# Patient Record
Sex: Male | Born: 1940 | Race: White | Hispanic: No | Marital: Married | Smoking: Former smoker
Health system: Southern US, Community
[De-identification: ages and names within clinical notes are randomized; demographics above are authoritative.]

## PROBLEM LIST (undated history)

## (undated) DIAGNOSIS — R339 Retention of urine, unspecified: Secondary | ICD-10-CM

## (undated) DIAGNOSIS — J439 Emphysema, unspecified: Secondary | ICD-10-CM

## (undated) DIAGNOSIS — C3490 Malignant neoplasm of unspecified part of unspecified bronchus or lung: Secondary | ICD-10-CM

## (undated) DIAGNOSIS — R1900 Intra-abdominal and pelvic swelling, mass and lump, unspecified site: Secondary | ICD-10-CM

## (undated) DIAGNOSIS — N312 Flaccid neuropathic bladder, not elsewhere classified: Secondary | ICD-10-CM

## (undated) DIAGNOSIS — Z973 Presence of spectacles and contact lenses: Secondary | ICD-10-CM

## (undated) DIAGNOSIS — K219 Gastro-esophageal reflux disease without esophagitis: Secondary | ICD-10-CM

## (undated) DIAGNOSIS — Z85048 Personal history of other malignant neoplasm of rectum, rectosigmoid junction, and anus: Secondary | ICD-10-CM

## (undated) DIAGNOSIS — Z933 Colostomy status: Secondary | ICD-10-CM

## (undated) DIAGNOSIS — Z972 Presence of dental prosthetic device (complete) (partial): Secondary | ICD-10-CM

## (undated) DIAGNOSIS — Z923 Personal history of irradiation: Secondary | ICD-10-CM

## (undated) DIAGNOSIS — K6289 Other specified diseases of anus and rectum: Secondary | ICD-10-CM

---

## 1970-12-07 HISTORY — PX: NASAL SEPTUM SURGERY: SHX37

## 1995-11-10 HISTORY — PX: OTHER SURGICAL HISTORY: SHX169

## 1996-07-07 HISTORY — PX: OTHER SURGICAL HISTORY: SHX169

## 1998-08-27 ENCOUNTER — Ambulatory Visit (HOSPITAL_COMMUNITY): Admission: RE | Admit: 1998-08-27 | Discharge: 1998-08-27 | Payer: Self-pay | Admitting: General Surgery

## 1998-09-05 ENCOUNTER — Ambulatory Visit (HOSPITAL_COMMUNITY): Admission: RE | Admit: 1998-09-05 | Discharge: 1998-09-05 | Payer: Self-pay | Admitting: General Surgery

## 1998-12-07 HISTORY — PX: OTHER SURGICAL HISTORY: SHX169

## 1998-12-20 ENCOUNTER — Encounter: Payer: Self-pay | Admitting: Emergency Medicine

## 1998-12-21 ENCOUNTER — Encounter: Payer: Self-pay | Admitting: General Surgery

## 1998-12-21 ENCOUNTER — Inpatient Hospital Stay (HOSPITAL_COMMUNITY): Admission: EM | Admit: 1998-12-21 | Discharge: 1999-01-06 | Payer: Self-pay | Admitting: Emergency Medicine

## 1998-12-22 ENCOUNTER — Encounter: Payer: Self-pay | Admitting: General Surgery

## 1998-12-24 ENCOUNTER — Encounter: Payer: Self-pay | Admitting: Pulmonary Disease

## 1998-12-25 ENCOUNTER — Encounter: Payer: Self-pay | Admitting: Pulmonary Disease

## 1998-12-26 ENCOUNTER — Encounter: Payer: Self-pay | Admitting: Pulmonary Disease

## 1998-12-27 ENCOUNTER — Encounter: Payer: Self-pay | Admitting: Pulmonary Disease

## 1998-12-31 ENCOUNTER — Encounter: Payer: Self-pay | Admitting: Pulmonary Disease

## 1999-01-02 ENCOUNTER — Encounter: Payer: Self-pay | Admitting: General Surgery

## 2001-01-05 ENCOUNTER — Emergency Department (HOSPITAL_COMMUNITY): Admission: EM | Admit: 2001-01-05 | Discharge: 2001-01-05 | Payer: Self-pay | Admitting: Emergency Medicine

## 2001-01-05 ENCOUNTER — Encounter: Payer: Self-pay | Admitting: Emergency Medicine

## 2002-06-29 ENCOUNTER — Ambulatory Visit (HOSPITAL_COMMUNITY): Admission: RE | Admit: 2002-06-29 | Discharge: 2002-06-29 | Payer: Self-pay | Admitting: Gastroenterology

## 2004-10-17 ENCOUNTER — Ambulatory Visit: Payer: Self-pay | Admitting: Gastroenterology

## 2004-11-12 ENCOUNTER — Ambulatory Visit: Payer: Self-pay | Admitting: Gastroenterology

## 2005-05-08 ENCOUNTER — Ambulatory Visit: Payer: Self-pay | Admitting: Hematology & Oncology

## 2006-04-07 ENCOUNTER — Encounter: Payer: Self-pay | Admitting: General Surgery

## 2011-03-03 ENCOUNTER — Other Ambulatory Visit: Payer: Self-pay | Admitting: Family Medicine

## 2011-03-03 DIAGNOSIS — R9389 Abnormal findings on diagnostic imaging of other specified body structures: Secondary | ICD-10-CM

## 2011-03-05 ENCOUNTER — Ambulatory Visit
Admission: RE | Admit: 2011-03-05 | Discharge: 2011-03-05 | Disposition: A | Discharge status: Home / Self Care | Payer: MEDICARE | Source: Ambulatory Visit | Attending: Family Medicine | Admitting: Family Medicine

## 2011-03-05 DIAGNOSIS — R9389 Abnormal findings on diagnostic imaging of other specified body structures: Secondary | ICD-10-CM

## 2011-03-05 MED ORDER — IOHEXOL 300 MG/ML  SOLN
75.0000 mL | Freq: Once | INTRAMUSCULAR | Status: AC | PRN
  Administered 2011-03-05: 75 mL via INTRAVENOUS

## 2013-11-28 ENCOUNTER — Institutional Professional Consult (permissible substitution): Payer: Medicare Other | Admitting: Internal Medicine

## 2013-12-08 ENCOUNTER — Encounter: Payer: Self-pay | Admitting: Internal Medicine

## 2013-12-08 ENCOUNTER — Ambulatory Visit (INDEPENDENT_AMBULATORY_CARE_PROVIDER_SITE_OTHER): Payer: Medicare Other | Admitting: Internal Medicine

## 2013-12-08 ENCOUNTER — Other Ambulatory Visit (INDEPENDENT_AMBULATORY_CARE_PROVIDER_SITE_OTHER): Payer: Medicare Other

## 2013-12-08 ENCOUNTER — Encounter (INDEPENDENT_AMBULATORY_CARE_PROVIDER_SITE_OTHER): Payer: Self-pay

## 2013-12-08 ENCOUNTER — Ambulatory Visit (INDEPENDENT_AMBULATORY_CARE_PROVIDER_SITE_OTHER)
Admission: RE | Admit: 2013-12-08 | Discharge: 2013-12-08 | Disposition: A | Discharge status: Home / Self Care | Payer: Medicare Other | Source: Ambulatory Visit | Attending: Internal Medicine | Admitting: Internal Medicine

## 2013-12-08 VITALS — BP 142/80 | HR 77 | Temp 98.3°F | Ht 69.0 in | Wt 129.8 lb

## 2013-12-08 DIAGNOSIS — F17200 Nicotine dependence, unspecified, uncomplicated: Secondary | ICD-10-CM

## 2013-12-08 DIAGNOSIS — R079 Chest pain, unspecified: Secondary | ICD-10-CM

## 2013-12-08 LAB — CBC WITH DIFFERENTIAL/PLATELET
BASOS PCT: 0.5 % (ref 0.0–3.0)
Basophils Absolute: 0.1 10*3/uL (ref 0.0–0.1)
EOS PCT: 1.9 % (ref 0.0–5.0)
Eosinophils Absolute: 0.2 10*3/uL (ref 0.0–0.7)
HEMATOCRIT: 38.7 % — AB (ref 39.0–52.0)
HEMOGLOBIN: 12.7 g/dL — AB (ref 13.0–17.0)
LYMPHS ABS: 2.3 10*3/uL (ref 0.7–4.0)
Lymphocytes Relative: 20.9 % (ref 12.0–46.0)
MCHC: 32.8 g/dL (ref 30.0–36.0)
MCV: 86.2 fl (ref 78.0–100.0)
MONO ABS: 0.7 10*3/uL (ref 0.1–1.0)
MONOS PCT: 6.4 % (ref 3.0–12.0)
NEUTROS ABS: 7.6 10*3/uL (ref 1.4–7.7)
Neutrophils Relative %: 70.3 % (ref 43.0–77.0)
Platelets: 608 10*3/uL — ABNORMAL HIGH (ref 150.0–400.0)
RBC: 4.49 Mil/uL (ref 4.22–5.81)
RDW: 15.3 % — ABNORMAL HIGH (ref 11.5–14.6)
WBC: 10.8 10*3/uL — AB (ref 4.5–10.5)

## 2013-12-08 LAB — BASIC METABOLIC PANEL
BUN: 15 mg/dL (ref 6–23)
CO2: 27 mEq/L (ref 19–32)
Calcium: 9.3 mg/dL (ref 8.4–10.5)
Chloride: 104 mEq/L (ref 96–112)
Creatinine, Ser: 1.1 mg/dL (ref 0.4–1.5)
GFR: 73.02 mL/min (ref >60.00)
Glucose, Bld: 90 mg/dL (ref 70–99)
POTASSIUM: 5.1 meq/L (ref 3.5–5.1)
SODIUM: 140 meq/L (ref 135–145)

## 2013-12-08 LAB — HEPATIC FUNCTION PANEL
ALBUMIN: 3.5 g/dL (ref 3.5–5.2)
ALK PHOS: 83 U/L (ref 39–117)
ALT: 12 U/L (ref 0–53)
AST: 19 U/L (ref 0–37)
Bilirubin, Direct: 0.1 mg/dL (ref 0.0–0.3)
TOTAL PROTEIN: 8.4 g/dL — AB (ref 6.0–8.3)
Total Bilirubin: 0.4 mg/dL (ref 0.3–1.2)

## 2013-12-08 LAB — SEDIMENTATION RATE: Sed Rate: 97 mm/hr — ABNORMAL HIGH (ref 0–22)

## 2013-12-08 MED ORDER — AMOXICILLIN-POT CLAVULANATE 875-125 MG PO TABS: 1.0000 | ORAL_TABLET | Freq: Two times a day (BID) | ORAL | Status: DC

## 2013-12-08 MED ORDER — TRAMADOL HCL 50 MG PO TABS: ORAL_TABLET | ORAL | Status: DC

## 2013-12-08 NOTE — Progress Notes (Signed)
   Subjective:    Patient ID: Randy Gould, male    DOB: [birth year]    MRN: [id number]  HPI  Randy Gould primary   77 yowm active smoker with h/o colon ca in 2003/4 with chemo and RT self referred 12/08/2013 to pulmonary clinic with new chest pain onset first week in Dec 2014.   12/08/2013 1st [facility] office visit/ Legion Discher cc new R Ant cp rad around under axilla to post chest no worse with deep breath burning sensation assoc with cough prod slt gray mucus - pain is continuous since abrupt onset early Dec 2014  but does crescendo s pattern severe spells lasting just a period of a few seconds somewhat positional by not really pleuritic. Has not seen primary or tried nsaids.  No obvious patterns in day to day or daytime variabilty or assoc sob or   subjective wheeze overt sinus or hb symptoms. No unusual exp hx or h/o childhood pna/ asthma or knowledge of premature birth.  Sleeping ok without nocturnal  or early am exacerbation  of respiratory  c/o's or need for noct saba. Also denies any obvious fluctuation of symptoms with weather or environmental changes or other aggravating or alleviating factors except as outlined above   Current Medications, Allergies, Complete Past Medical History, Past Surgical History, Family History, and Social History were reviewed in [employer] record.   .         Review of Systems  Constitutional: Negative for fever and unexpected weight change.  HENT: Negative for congestion, dental problem, ear pain, nosebleeds, postnasal drip, rhinorrhea, sinus pressure, sneezing, sore throat and trouble swallowing.   Eyes: Negative for redness and itching.  Respiratory: Positive for cough and chest tightness. Negative for shortness of breath and wheezing.   Cardiovascular: Positive for chest pain. Negative for palpitations and leg swelling.  Gastrointestinal: Negative for nausea and vomiting.  Genitourinary: Negative for dysuria.   Musculoskeletal: Negative for joint swelling.  Skin: Negative for rash.  Neurological: Negative for headaches.  Hematological: Does not bruise/bleed easily.  Psychiatric/Behavioral: Negative for dysphoric mood. The patient is not nervous/anxious.        Objective:   Physical Exam  Wt Readings from Last 3 Encounters:  12/08/13 129 lb 12.8 oz (58.877 kg)     HEENT mild turbinate edema.  Oropharynx no thrush or excess pnd or cobblestoning.  No JVD or cervical adenopathy. Mild accessory muscle hypertrophy. Trachea midline, nl thryroid. Chest was hyperinflated by percussion with diminished breath sounds and moderate increased exp time without wheeze. Hoover sign positive at mid inspiration. Regular rate and rhythm without murmur gallop or rub or increase P2 or edema.  Abd: no hsm, nl excursion. Ext warm without cyanosis or clubbing.        CXR  12/08/2013 :  Chronic changes right upper lobe. COPD and underlying changes of pulmonary fibrosis. Otherwise no acute cardiopulmonary abnormalities.  Labs 12/08/13 ok except ESR 97     Assessment & Plan:

## 2013-12-08 NOTE — Patient Instructions (Addendum)
Augmentin 875 mg take one pill twice daily  X 10 days - take at breakfast and supper with large glass of water.  It would help reduce the usual side effects (diarrhea and yeast infections) if you ate cultured yogurt at lunch.   Tramadol 50 mg up to 2 every 4 hours to suppress the pain or coughing   Please remember to go to the lab and x-ray department downstairs for your tests - we will call you with the results when they are available.  The key is to stop smoking completely before smoking completely stops you!   We will call you with additional recommendations    Late add:  Will contact 12/11/13 and if not improving proceed with  cta chest

## 2013-12-10 DIAGNOSIS — F17200 Nicotine dependence, unspecified, uncomplicated: Secondary | ICD-10-CM | POA: Insufficient documentation

## 2013-12-10 DIAGNOSIS — IMO0001 Reserved for inherently not codable concepts without codable children: Secondary | ICD-10-CM | POA: Insufficient documentation

## 2013-12-10 NOTE — Assessment & Plan Note (Addendum)

## 2013-12-10 NOTE — Assessment & Plan Note (Addendum)
His RUL near the area where he describes the pain has not changed in 2 years on comparison CT chest - given esr 97  concerned about empyema or  rib mets/ recurrent ca clinically but not obvious by cxr.  Will rx as bronchitis with augmentin and if not improving proceed to cta of chest the week of 12/11/13

## 2013-12-11 ENCOUNTER — Other Ambulatory Visit: Payer: Self-pay | Admitting: Internal Medicine

## 2013-12-11 DIAGNOSIS — R079 Chest pain, unspecified: Secondary | ICD-10-CM

## 2013-12-11 NOTE — Progress Notes (Signed)
Quick Note:  Spoke with pt and notified of results per Dr. Wert. Pt verbalized understanding and denied any questions.  ______ 

## 2013-12-12 ENCOUNTER — Ambulatory Visit (INDEPENDENT_AMBULATORY_CARE_PROVIDER_SITE_OTHER)
Admission: RE | Admit: 2013-12-12 | Discharge: 2013-12-12 | Disposition: A | Discharge status: Home / Self Care | Payer: Medicare Other | Source: Ambulatory Visit | Attending: Internal Medicine | Admitting: Internal Medicine

## 2013-12-12 DIAGNOSIS — R079 Chest pain, unspecified: Secondary | ICD-10-CM

## 2013-12-12 MED ORDER — IOHEXOL 350 MG/ML SOLN
80.0000 mL | Freq: Once | INTRAVENOUS | Status: AC | PRN
  Administered 2013-12-12: 80 mL via INTRAVENOUS

## 2013-12-13 ENCOUNTER — Telehealth: Payer: Self-pay | Admitting: Internal Medicine

## 2013-12-13 DIAGNOSIS — R079 Chest pain, unspecified: Secondary | ICD-10-CM

## 2013-12-13 NOTE — Telephone Encounter (Signed)
Called pt  Discussed in detail all the  indications, usual  risks and alternatives  relative to the benefits with patient who agrees to proceed with CT Bx of mass

## 2013-12-13 NOTE — Telephone Encounter (Signed)
Pt is requesting CT results from 12-12-13. Please advise.Beasley Bing, CMA

## 2013-12-14 ENCOUNTER — Telehealth: Payer: Self-pay | Admitting: Internal Medicine

## 2013-12-14 NOTE — Telephone Encounter (Signed)
Records sent to IR to review and they will contact the pt with appts. [place] Bing, CMA

## 2013-12-14 NOTE — Progress Notes (Signed)
Quick Note:  Order sent to [facility] ______

## 2013-12-14 NOTE — Telephone Encounter (Signed)
Order sent to [facility] 

## 2013-12-15 ENCOUNTER — Other Ambulatory Visit: Payer: Self-pay | Admitting: Internal Medicine

## 2013-12-15 ENCOUNTER — Telehealth: Payer: Self-pay | Admitting: Internal Medicine

## 2013-12-15 DIAGNOSIS — R918 Other nonspecific abnormal finding of lung field: Secondary | ICD-10-CM

## 2013-12-15 DIAGNOSIS — C3410 Malignant neoplasm of upper lobe, unspecified bronchus or lung: Secondary | ICD-10-CM | POA: Insufficient documentation

## 2013-12-15 NOTE — Telephone Encounter (Signed)
I called and spoke w/ pt. He is scheduled for BX next week. He thought he was suppose to be an ABX until his BX. I advised him when he saw MW he was just doing a 10 day course for his symptoms. Pt verbalized understanding. He reports he is feeling better. Nothing further needed

## 2013-12-19 ENCOUNTER — Other Ambulatory Visit: Payer: Self-pay | Admitting: Internal Medicine

## 2013-12-21 ENCOUNTER — Telehealth: Payer: Self-pay | Admitting: Internal Medicine

## 2013-12-21 ENCOUNTER — Ambulatory Visit (HOSPITAL_COMMUNITY)
Admission: RE | Admit: 2013-12-21 | Discharge: 2013-12-21 | Disposition: A | Discharge status: Home / Self Care | Payer: Medicare Other | Source: Ambulatory Visit | Attending: Internal Medicine | Admitting: Internal Medicine

## 2013-12-21 ENCOUNTER — Encounter: Payer: Self-pay | Admitting: Internal Medicine

## 2013-12-21 DIAGNOSIS — Z85038 Personal history of other malignant neoplasm of large intestine: Secondary | ICD-10-CM | POA: Insufficient documentation

## 2013-12-21 DIAGNOSIS — R091 Pleurisy: Secondary | ICD-10-CM | POA: Insufficient documentation

## 2013-12-21 DIAGNOSIS — C3410 Malignant neoplasm of upper lobe, unspecified bronchus or lung: Secondary | ICD-10-CM | POA: Insufficient documentation

## 2013-12-21 DIAGNOSIS — J438 Other emphysema: Secondary | ICD-10-CM | POA: Insufficient documentation

## 2013-12-21 DIAGNOSIS — Z933 Colostomy status: Secondary | ICD-10-CM | POA: Insufficient documentation

## 2013-12-21 DIAGNOSIS — R918 Other nonspecific abnormal finding of lung field: Secondary | ICD-10-CM | POA: Insufficient documentation

## 2013-12-21 LAB — GLUCOSE, CAPILLARY: Glucose-Capillary: 93 mg/dL (ref 70–99)

## 2013-12-21 MED ORDER — FLUDEOXYGLUCOSE F - 18 (FDG) INJECTION: 19.5000 | Freq: Once | INTRAVENOUS | Status: AC | PRN

## 2013-12-21 NOTE — Telephone Encounter (Signed)
Spoke with libby about the order for the bx.  She stated that they required the pt to do the PET scan first.  Since this has been done they will review these results and IR will call him either Friday or Monday to set the bx up for the pt.    lmomtcb x 1 for the pt.

## 2013-12-21 NOTE — Telephone Encounter (Signed)
Message closed in error. And copied below    [place] at 12/21/2013 5:13 PM     Status: Signed        Pt returned call          [place] at 12/21/2013 5:09 PM     Status: Signed        Spoke with libby about the order for the bx. She stated that they required the pt to do the PET scan first. Since this has been done they will review these results and IR will call him either Friday or Monday to set the bx up for the pt.  lmomtcb x 1 for the pt.                  Encounter MyChart Messages     No messages in this encounter             Routing History     Priority Sent On From To Message Type     12/21/2013 5:11 PM Elie Confer, [place] [facility] Triage Pool Patient Calls     12/21/2013 2:46 PM Tempie Donning [facility] Triage Pool Patient Calls           Created by     Tempie Donning on 12/21/2013 02:44 PM                               Visit Pharmacy     [id number] - [place], [place] - [address]             Contacts       Type Contact Phone    12/21/2013 2:44 PM Phone (Incoming) Randy Gould, Randy Gould (Self) [phone] (M)    pt had all his test done today. however he thinks he is suppose to have a biopsy. he is not sure if they talked about doing it or saying that was an option. would like to clarify this.

## 2013-12-21 NOTE — Telephone Encounter (Signed)
Pt returned call

## 2013-12-22 NOTE — Progress Notes (Signed)
Quick Note:  Spoke with pt and notified of results per Dr. Wert. Pt verbalized understanding and denied any questions.  ______ 

## 2013-12-22 NOTE — Telephone Encounter (Signed)
Pt advised. Amad Mau, CMA  

## 2013-12-26 ENCOUNTER — Encounter (HOSPITAL_COMMUNITY): Payer: Self-pay | Admitting: Pharmacy Technician

## 2013-12-26 ENCOUNTER — Other Ambulatory Visit (HOSPITAL_COMMUNITY): Payer: Self-pay | Admitting: Radiology

## 2013-12-27 ENCOUNTER — Ambulatory Visit (HOSPITAL_COMMUNITY)
Admission: RE | Admit: 2013-12-27 | Discharge: 2013-12-27 | Disposition: A | Discharge status: Home / Self Care | Payer: Medicare Other | Source: Ambulatory Visit | Attending: Diagnostic Radiology | Admitting: Diagnostic Radiology

## 2013-12-27 ENCOUNTER — Encounter (HOSPITAL_COMMUNITY): Payer: Self-pay

## 2013-12-27 ENCOUNTER — Ambulatory Visit (HOSPITAL_COMMUNITY)
Admission: RE | Admit: 2013-12-27 | Discharge: 2013-12-27 | Disposition: A | Discharge status: Home / Self Care | Payer: Medicare Other | Source: Ambulatory Visit | Attending: Internal Medicine | Admitting: Internal Medicine

## 2013-12-27 VITALS — BP 136/55 | HR 57 | Temp 98.1°F | Resp 20 | Ht 69.0 in | Wt 133.0 lb

## 2013-12-27 DIAGNOSIS — J438 Other emphysema: Secondary | ICD-10-CM | POA: Insufficient documentation

## 2013-12-27 DIAGNOSIS — F17200 Nicotine dependence, unspecified, uncomplicated: Secondary | ICD-10-CM | POA: Insufficient documentation

## 2013-12-27 DIAGNOSIS — C3410 Malignant neoplasm of upper lobe, unspecified bronchus or lung: Secondary | ICD-10-CM | POA: Insufficient documentation

## 2013-12-27 DIAGNOSIS — R918 Other nonspecific abnormal finding of lung field: Secondary | ICD-10-CM

## 2013-12-27 LAB — CBC
HEMATOCRIT: 37.4 % — AB (ref 39.0–52.0)
HEMOGLOBIN: 12.7 g/dL — AB (ref 13.0–17.0)
MCH: 28.4 pg (ref 26.0–34.0)
MCHC: 34 g/dL (ref 30.0–36.0)
MCV: 83.7 fL (ref 78.0–100.0)
Platelets: 450 10*3/uL — ABNORMAL HIGH (ref 150–400)
RBC: 4.47 MIL/uL (ref 4.22–5.81)
RDW: 14.7 % (ref 11.5–15.5)
WBC: 10.9 10*3/uL — AB (ref 4.0–10.5)

## 2013-12-27 LAB — APTT: APTT: 35 s (ref 24–37)

## 2013-12-27 LAB — PROTIME-INR
INR: 0.99 (ref 0.00–1.49)
Prothrombin Time: 12.9 seconds (ref 11.6–15.2)

## 2013-12-27 MED ORDER — FENTANYL CITRATE 0.05 MG/ML IJ SOLN
INTRAMUSCULAR | Status: AC
  Filled 2013-12-27: qty 2

## 2013-12-27 MED ORDER — HYDROCODONE-ACETAMINOPHEN 5-325 MG PO TABS
1.0000 | ORAL_TABLET | ORAL | Status: DC | PRN
  Filled 2013-12-27: qty 2

## 2013-12-27 MED ORDER — MIDAZOLAM HCL 2 MG/2ML IJ SOLN
INTRAMUSCULAR | Status: AC
  Filled 2013-12-27: qty 2

## 2013-12-27 MED ORDER — MIDAZOLAM HCL 2 MG/2ML IJ SOLN
INTRAMUSCULAR | Status: AC | PRN
  Administered 2013-12-27 (×2): 1 mg via INTRAVENOUS

## 2013-12-27 MED ORDER — LIDOCAINE HCL 1 % IJ SOLN
INTRAMUSCULAR | Status: DC
Start: 2013-12-27 — End: 2013-12-28
  Filled 2013-12-27: qty 10

## 2013-12-27 MED ORDER — SODIUM CHLORIDE 0.9 % IV SOLN
Freq: Once | INTRAVENOUS | Status: AC
  Administered 2013-12-27: 10:00:00 via INTRAVENOUS

## 2013-12-27 MED ORDER — FENTANYL CITRATE 0.05 MG/ML IJ SOLN
INTRAMUSCULAR | Status: AC | PRN
  Administered 2013-12-27 (×2): 25 ug via INTRAVENOUS

## 2013-12-27 NOTE — Progress Notes (Signed)
Received pt alert and denies any discomfort at this time.

## 2013-12-27 NOTE — Discharge Instructions (Signed)
Lung Biopsy A lung biopsy is a procedure in which a tissue sample is removed from the lung. The tissue can be examined under a microscope to help diagnose various lung disorders.  LET [facility] CARE PROVIDER KNOW ABOUT:  Any allergies you have.  All medicines you are taking, including vitamins, herbs, eye drops, creams, and over-the-counter medicines.  Previous problems you or members of your family have had with the use of anesthetics.  Any blood disorders or bleeding problems that you have.  Previous surgeries you have had.  Medical conditions you have. RISKS AND COMPLICATIONS Generally, a lung biopsy is a safe procedure. However, as with any procedure, complications can occur. Possible complications include:  Collapse of the lung.   Bleeding.   Infection.  BEFORE THE PROCEDURE  Do not eat or drink anything for 8 hours before the procedure or as directed by your health care provider.  Ask your health care provider if you need to change or stop taking your regular medicines before the procedure.  Arrange for someone to drive you home after the procedure. PROCEDURE Various methods can be used to perform a lung biopsy:   Needle biopsy: A biopsy needle is inserted into the lung. The needle is used to collect the tissue sample. A CT scanner may be used to guide the needle to the right place in the lung. For this method, a medicine is used to numb the area where the biopsy sample will be taken (local anesthetic).  Bronchoscopy: A flexible tube (bronchoscope) is inserted into your lungs by going through your mouth or nose. A needle or forceps is passed through the bronchoscope to remove the tissue sample. For this method, medicine may be used to numb the back of your throat.  Open biopsy: A cut (incision) is made in your chest. The tissue sample is then removed using surgical tools. The incision is closed with skin glue, skin adhesive strips, or stitches. For this method, you  will be given medicine to make you sleep through the procedure (general anesthetic). AFTER THE PROCEDURE Your recovery will be assessed and monitored. For the needle or bronchoscope method, you may be allowed to go home on the day of your procedure as soon as you are stable. For the open biopsy method, you may need to stay in the hospital overnight for observation. You might have soreness and tenderness at the site of the biopsy for a few days after the procedure. You might have a cough and some soreness in your throat for a few days if a bronchoscope was used. Document Released: 02/11/2005 Document Revised: 07/26/2013 Document Reviewed: 05/07/2013 [facility] Patient Information 2014 [place].

## 2013-12-27 NOTE — Progress Notes (Signed)
Discharge instruction given per MD order.  Pt and CG able to verbalize understanding.  Pt to car via wheelchair.

## 2013-12-27 NOTE — Procedures (Signed)
CT guided core biopsies of right upper lobe lesion.  3 cores performed and 2 adequate specimens obtained.  No immediate complication.

## 2013-12-27 NOTE — Progress Notes (Signed)
Pt returned form radiology.  No problem at site.

## 2013-12-27 NOTE — H&P (Signed)
Randy Gould is an 73 y.o. male.   Chief Complaint: Pt developed chest pain 3-4 weeks ago +smoker Work up revealed abnormal CXR CTA for possible PE shows Rt lung mass  +PET Scheduled now for RUL mass biopsy Pt has hx colon ca  HPI: R lung mass; hx colon ca; smoker  Past Medical History  Diagnosis Date  . Cancer of colon 2003    Past Surgical History  Procedure Laterality Date  . Colon surgery      Family History  Problem Relation Age of Onset  . Cancer      stomach   Social History:  reports that he has been smoking Cigarettes.  He has a 30 pack-year smoking history. He does not have any smokeless tobacco history on file. He reports that he drinks alcohol. He reports that he does not use illicit drugs.  Allergies:  Allergies  Allergen Reactions  . Ativan [Lorazepam] Other (See Comments)    REACTION: Increase heart rate  . Other Itching and Rash    States "head to toe" itching from a "powerful antibiotic" administered years ago.  Cannot recall name of antibiotic.     (Not in a hospital admission)  Results for orders placed during the hospital encounter of 12/27/13 (from the past 48 hour(s))  APTT     Status: None   Collection Time    12/27/13  9:37 AM      Result Value Range   aPTT 35  24 - 37 seconds  CBC     Status: Abnormal   Collection Time    12/27/13  9:37 AM      Result Value Range   WBC 10.9 (*) 4.0 - 10.5 K/uL   RBC 4.47  4.22 - 5.81 MIL/uL   Hemoglobin 12.7 (*) 13.0 - 17.0 g/dL   HCT 37.4 (*) 39.0 - 52.0 %   MCV 83.7  78.0 - 100.0 fL   MCH 28.4  26.0 - 34.0 pg   MCHC 34.0  30.0 - 36.0 g/dL   RDW 14.7  11.5 - 15.5 %   Platelets 450 (*) 150 - 400 K/uL  PROTIME-INR     Status: None   Collection Time    12/27/13  9:37 AM      Result Value Range   Prothrombin Time 12.9  11.6 - 15.2 seconds   INR 0.99  0.00 - 1.49   No results found.  Review of Systems  Constitutional: Positive for weight loss. Negative for fever.  Respiratory: Negative for  cough and shortness of breath.   Cardiovascular: Positive for chest pain.  Gastrointestinal: Negative for nausea, vomiting and abdominal pain.  Neurological: Positive for weakness. Negative for headaches.  Psychiatric/Behavioral: Positive for substance abuse.       Smoker    Blood pressure 145/81, pulse 83, temperature 98 F (36.7 C), temperature source Oral, resp. rate 18, height 5\' 9"  (1.753 m), weight 133 lb (60.328 kg), SpO2 98.00%. Physical Exam  Constitutional: He is oriented to person, place, and time. He appears well-developed.  thin  Cardiovascular: Normal rate, regular rhythm and normal heart sounds.   No murmur heard. Respiratory: Effort normal and breath sounds normal. He has no wheezes.  GI: Soft. Bowel sounds are normal. There is no tenderness.  Musculoskeletal: Normal range of motion.  Neurological: He is alert and oriented to person, place, and time.  Skin: Skin is warm and dry.  Psychiatric: He has a normal mood and affect. His behavior is normal. Judgment  and thought content normal.     Assessment/Plan Rt lung mass Hx colon ca; smoker +PET Scheduled for biopsy now Pt aware of procedure benefits and risks and agreeable to proceed Consent signed and in chart     TURPIN,PAMELA A 12/27/2013, 10:57 AM

## 2013-12-29 ENCOUNTER — Encounter: Payer: Self-pay | Admitting: Internal Medicine

## 2013-12-29 ENCOUNTER — Other Ambulatory Visit: Payer: Self-pay | Admitting: Internal Medicine

## 2013-12-29 DIAGNOSIS — R918 Other nonspecific abnormal finding of lung field: Secondary | ICD-10-CM

## 2014-01-01 ENCOUNTER — Telehealth: Payer: Self-pay | Admitting: *Deleted

## 2014-01-01 NOTE — Telephone Encounter (Signed)
Called pt home phone.  Asked if pt was aware of appt for thoracic clinic this Thursday.  Family stated they are aware of appt and will be here at 2:45

## 2014-01-02 ENCOUNTER — Encounter (HOSPITAL_COMMUNITY): Payer: Self-pay

## 2014-01-03 ENCOUNTER — Encounter: Payer: Self-pay | Admitting: *Deleted

## 2014-01-03 NOTE — Progress Notes (Signed)
Received fax from [facility] stating ALK not detected.  Place in new pt chart for Dr. Julien Nordmann to see pt tomorrow at thoracic clinic

## 2014-01-04 ENCOUNTER — Encounter: Payer: Self-pay | Admitting: Radiation Oncology

## 2014-01-04 ENCOUNTER — Encounter: Payer: Self-pay | Admitting: *Deleted

## 2014-01-04 ENCOUNTER — Ambulatory Visit
Admission: RE | Admit: 2014-01-04 | Discharge: 2014-01-04 | Disposition: A | Discharge status: Home / Self Care | Payer: Medicare Other | Source: Ambulatory Visit | Attending: Radiation Oncology | Admitting: Radiation Oncology

## 2014-01-04 ENCOUNTER — Ambulatory Visit: Payer: Medicare Other | Attending: Cardiothoracic Surgery | Admitting: Physical Therapy

## 2014-01-04 ENCOUNTER — Institutional Professional Consult (permissible substitution) (INDEPENDENT_AMBULATORY_CARE_PROVIDER_SITE_OTHER): Payer: Medicare Other | Admitting: Cardiothoracic Surgery

## 2014-01-04 ENCOUNTER — Other Ambulatory Visit: Payer: Self-pay | Admitting: *Deleted

## 2014-01-04 VITALS — BP 123/75 | HR 83 | Temp 97.3°F | Resp 18 | Ht 68.0 in | Wt 125.2 lb

## 2014-01-04 DIAGNOSIS — F17200 Nicotine dependence, unspecified, uncomplicated: Secondary | ICD-10-CM | POA: Insufficient documentation

## 2014-01-04 DIAGNOSIS — C3410 Malignant neoplasm of upper lobe, unspecified bronchus or lung: Secondary | ICD-10-CM | POA: Insufficient documentation

## 2014-01-04 DIAGNOSIS — R918 Other nonspecific abnormal finding of lung field: Secondary | ICD-10-CM

## 2014-01-04 DIAGNOSIS — Z85038 Personal history of other malignant neoplasm of large intestine: Secondary | ICD-10-CM | POA: Insufficient documentation

## 2014-01-04 DIAGNOSIS — R079 Chest pain, unspecified: Secondary | ICD-10-CM

## 2014-01-04 DIAGNOSIS — IMO0001 Reserved for inherently not codable concepts without codable children: Secondary | ICD-10-CM | POA: Insufficient documentation

## 2014-01-04 DIAGNOSIS — R222 Localized swelling, mass and lump, trunk: Secondary | ICD-10-CM

## 2014-01-04 DIAGNOSIS — R293 Abnormal posture: Secondary | ICD-10-CM | POA: Insufficient documentation

## 2014-01-04 DIAGNOSIS — J438 Other emphysema: Secondary | ICD-10-CM | POA: Insufficient documentation

## 2014-01-04 NOTE — Progress Notes (Addendum)
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 male who was treated for stage T3 N1 rectal cancer at age 44 with LAR followed by chemoradiotherapy complicated by anastomosis breakdown requiring revision multiple surgeries as recently as 2005.  This ultimately required colostomy for diversion.  More recently, he presented with chest pain on right side.  Chest X-Ray and subsequent CT angio showed a 5.7 cm left upper lobe peripheral mass with likely chest wall invasion.  PET scan confirmed hypermetabolisim there without mediastinal lymphadenopathy suggesting T3 N0 disease, with some hypermetabolic pelvic nodes of uncertain significance.  CT biopsy of the lung mass shows adenocarcinoma.  He was referred to our multidisciplinary clinic today to help devise a management plan.  PREVIOUS RADIATION THERAPY: Yes as above  PAST MEDICAL HISTORY:  has a past medical history of Cancer of colon (2003).    PAST SURGICAL HISTORY: Past Surgical History  Procedure Laterality Date  . Colon surgery      FAMILY HISTORY: family history includes Cancer in an other family member.  SOCIAL HISTORY:  reports that he has been smoking Cigarettes.  He has a 30 pack-year smoking history. He does not have any smokeless tobacco history on file. He reports that he drinks alcohol. He reports that he does not use illicit drugs.  ALLERGIES: Ativan and Other  MEDICATIONS:  Current Outpatient Prescriptions  Medication Sig Dispense Refill  . acetaminophen (TYLENOL) 650 MG CR tablet Take 650 mg by mouth every 8 (eight) hours  as needed for pain.      Marland Kitchen esomeprazole (NEXIUM) 20 MG capsule Take 20 mg by mouth daily at 12 noon.      . traMADol (ULTRAM) 50 MG tablet Take 50-100 mg by mouth every 4 (four) hours as needed for moderate pain.       No current facility-administered medications for this encounter.    REVIEW OF SYSTEMS:  A 15 point review of systems is documented in the electronic medical record. This was obtained by the nursing staff. However, I reviewed this with the patient to discuss relevant findings and make appropriate changes.  Pertinent items are noted in HPI.   PHYSICAL EXAM:  vitals were not taken for this visit.  Per Dr. Anselm Pancoast Blood pressure 145/81, pulse 83, temperature 98 F (36.7 C), temperature source Oral, resp. rate 18, height 5\' 9"  (1.753 m), weight 133 lb (60.328 kg), SpO2 98.00%.  Physical Exam  Constitutional: He is oriented to person, place, and time. He appears well-developed.  thin  Cardiovascular: Normal rate, regular rhythm and normal heart sounds.  No murmur heard.  Respiratory: Effort normal and breath sounds normal. He has no wheezes.  GI: Soft. Bowel sounds are normal. There is no tenderness.  Musculoskeletal: Normal range of motion.  Neurological: He is alert and oriented to person, place, and time.  Skin: Skin is warm and dry.  Psychiatric: He has a normal mood and affect. His behavior is normal. Judgment and thought content normal.   KPS = 80  100 - Normal; no complaints; no evidence of disease. 90   - Able to carry on  normal activity; minor signs or symptoms of disease. 80   - Normal activity with effort; some signs or symptoms of disease. 67   - Cares for self; unable to carry on normal activity or to do active work. 60   - Requires occasional assistance, but is able to care for most of his personal needs. 50   - Requires considerable assistance and frequent medical care. 40   - Disabled; requires special care and assistance. 50   - Severely disabled; hospital  admission is indicated although death not imminent. 23   - Very sick; hospital admission necessary; active supportive treatment necessary. 10   - Moribund; fatal processes progressing rapidly. 0     - Dead  Karnofsky DA, Abelmann Christiana, Craver LS and Burchenal JH [phone]) The use of the nitrogen mustards in the palliative treatment of carcinoma: with particular reference to bronchogenic carcinoma Cancer 1 634-56  LABORATORY DATA:  Lab Results  Component Value Date   WBC 10.9* 12/27/2013   HGB 12.7* 12/27/2013   HCT 37.4* 12/27/2013   MCV 83.7 12/27/2013   PLT 450* 12/27/2013   Lab Results  Component Value Date   NA 140 12/08/2013   K 5.1 12/08/2013   CL 104 12/08/2013   CO2 27 12/08/2013   Lab Results  Component Value Date   ALT 12 12/08/2013   AST 19 12/08/2013   ALKPHOS 83 12/08/2013   BILITOT 0.4 12/08/2013    PULMONARY FUNCTION TEST:   pending  RADIOGRAPHY: Dg Chest 1 View  12/27/2013   CLINICAL DATA:  Post right lung biopsy  EXAM: CHEST - 1 VIEW  COMPARISON:  Chest CT December 27, 2013  FINDINGS: There is no demonstrable pneumothorax. There is a mass in the periphery of the right upper lobe near the apex. There is underlying emphysematous change. There is no frank edema or consolidation. Heart size is normal. Pulmonary vascularity reflects underlying emphysema. No adenopathy.  IMPRESSION: No pneumothorax apparent. Mass periphery right upper lobe. Underlying emphysema.   Electronically Signed   By: Lowella Grip M.D.   On: 12/27/2013 14:20   Dg Chest 2 View  12/08/2013   CLINICAL DATA:  Chest pain  EXAM: CHEST  2 VIEW  COMPARISON:  Correlated with chest CT 03/05/2011.  FINDINGS: The lungs are hyperinflated. There is flattening in the hemidiaphragms. Chronic changes are appreciated within the periphery of the right upper lobe. No further focal regions of consolidation or focal infiltrates appreciated. There is diffuse thickening of the interstitial markings. Blunting of costophrenic angles  identified. The cardiac silhouette and osseous structures are unremarkable.  IMPRESSION: Chronic changes right upper lobe. COPD and underlying changes of pulmonary fibrosis. Otherwise no acute cardiopulmonary abnormalities.   Electronically Signed   By: Margaree Mackintosh M.D.   On: 12/08/2013 12:48   Ct Angio Chest Pe W/cm &/or Wo Cm  12/12/2013   CLINICAL DATA:  Right upper chest burning sensation or 3 weeks question pulmonary embolism, history colon cancer  EXAM: CT ANGIOGRAPHY CHEST WITH CONTRAST  TECHNIQUE: Multidetector CT imaging of the chest was performed using the standard protocol during bolus administration of intravenous contrast. Multiplanar CT image reconstructions including MIPs were obtained to evaluate the vascular anatomy.  CONTRAST:  20mL OMNIPAQUE IOHEXOL 350 MG/ML SOLN  COMPARISON:  CT chest with contrast 03/05/2011  FINDINGS: Atherosclerotic calcifications aorta without aneurysm or dissection.  Pulmonary arteries patent.  No evidence of pulmonary embolism.  No thoracic adenopathy.  Nonspecific 6 mm low-attenuation focus last segment left lobe  liver image 98 unchanged.  Remaining visualized portions of upper abdomen normal appearance.  Peripheral mass identified at the lateral aspect of the right upper hemithorax, contiguous with the pleural surface over a broad base, more confluent and mass-like in appearance versus the previous exam.  Finding is worrisome for a Pancoast tumor measuring 5.7 x 2.7 x 4.6 cm  Suspected associated bone destruction of the lateral aspect of the right 3rd rib.  Underlying COPD changes with calcified granuloma at the right upper lobe.  Peripheral areas of scarring in both lungs predominantly upper lobes.  Subsegmental atelectasis right lower lobe.  Remaining lungs clear.  No pleural effusion or pneumothorax.  No additional osseous findings.  Review of the MIP images confirms the above findings.  IMPRESSION: No evidence of pulmonary embolism.  Peripheral tumor with broad  pleural base at the lateral aspect of the right upper lobe 5.7 x 2.7 x 4.6 cm in size worrisome for a Pancoast tumor; recommend tissue diagnosis with PET-CT imaging. .  Associated bone destruction of the lateral aspect of the right 3rd rib is suspected.  Severe underlying COPD and old granulomatous disease.   Electronically Signed   By: Lavonia Dana M.D.   On: 12/12/2013 15:25   Nm Pet Image Initial (pi) Skull Base To Thigh  12/21/2013   CLINICAL DATA:  Initial treatment strategy for lung mass. Prior history of colon cancer.  EXAM: NUCLEAR MEDICINE PET SKULL BASE TO THIGH  FASTING BLOOD GLUCOSE:  Value: 93mg /dl  TECHNIQUE: 19.5 mCi F-18 FDG was injected intravenously. CT data was obtained and used for attenuation correction and anatomic localization only. (This was not acquired as a diagnostic CT examination.) Additional exam technical data entered on technologist worksheet.  COMPARISON:  CT ANGIO CHEST W/CM &/OR WO/CM dated 12/12/2013; DG CHEST 2 VIEW dated 12/08/2013; CT CHEST W/CM dated 03/05/2011  FINDINGS: NECK  No hypermetabolic lymph nodes in the neck.  CHEST  There is a hypermetabolic mass in the lateral right upper lobe measuring 5.1 x 2.7 cm with intense metabolic activity (SUV max 16.8). This mass shares a broad surface with the chest wall and appears to involve adjacent ribs. There is a small focus of hypermetabolic nodular pleural thickening in the left upper lobe measuring 7 mm (image 60) with mild metabolic activity (SUV max 2.6). There is extensive centrilobular emphysema with upper lobe predominance. No hypermetabolic mediastinal lymph nodes.  ABDOMEN/PELVIS  There is asymmetric hypermetabolic activity associated with the lower rectal region at site of prior surgical resection and surgical clips. Difficult to define the tissue planes at this level due to CT technique and lack of contrast. Hypermetabolic activity is seen on CT image 206 just anterior to the sacrum on the right. Second nodular focus of  hypermetabolic activity in the deep pelvis just right of midline on image 194 without clear definable lesion on the CT portion. A thirdfocus of activity is noted medial to the left psoas muscle (image 183. This could represent ureteral activity. There is physiologic activity noted in the left lower quadrant colostomy. No abnormal metabolic activity within the liver or adrenal glands.  SKELETON  No focal hypermetabolic activity to suggest skeletal metastasis.  IMPRESSION: 1. Hypermetabolic right upper lobe mass the chest wall is concerning for bronchogenic carcinoma. Recommend tissue sampling. 2. Hypermetabolic mild nodular thickening in the left upper lobe is less specific and may be inflammatory. Recommend attention on follow-up. 3. No hypermetabolic mediastinal lymph nodes. 4. Asymmetric hypermetabolic activity in the deep right pelvis adjacent  to the sacrum with differential including local recurrence versus potential deep pelvic infection or physiologic activity. Two additional foci of activity in the pelvis could represent local metastasis or physiologic activity. Recommend correlation with CEA and if elevated recommend contrast-enhanced CT of the pelvis for further evaluation.   Electronically Signed   By: Suzy Bouchard M.D.   On: 12/21/2013 16:20   Ct Biopsy  12/27/2013   CLINICAL DATA:  Right lung lesion.  Tissue diagnosis is needed.  EXAM: CT-GUIDED BIOPSY OF RIGHT LUNG LESION  Physician: Stephan Minister. Henn, MD  MEDICATIONS: Versed 2 mg, fentanyl 50 mcg. A radiology nurse monitored the patient for moderate sedation.  ANESTHESIA/SEDATION: Sedation time: 29 min  PROCEDURE: The procedure was explained to the patient. The risks and benefits of the procedure were discussed and the patient's questions were addressed. Informed consent was obtained from the patient. Patient was placed supine on the CT scanner. Images through the chest were obtained. The lesion in the periphery of the right upper lobe was  identified. The patient's right axilla was shaved. The right axilla was prepped and draped in sterile fashion. Skin was anesthetized with 1% lidocaine. Using CT guidance, a 17 gauge needle was directed into the peripheral lesion. Needle placement was confirmed within the lesion. A total of 3 core biopsies were performed with an 18 gauge core device. Two adequate specimens were obtained. Samples were placed in formalin. 17 gauge needle was removed without complication. A small bandage was placed over the puncture site.  COMPLICATIONS: None  FINDINGS: Pleural based lesion in the lateral right upper lobe. There is mild irregularity of the adjacent right third rib which may be contributing to the patient's symptoms in this area. Patient has diffuse centrilobular emphysema. Needle placement confirmed within the lesion. No evidence for a pneumothorax following the core biopsies.  IMPRESSION: CT-guided core biopsies of the right lung mass.   Electronically Signed   By: Markus Daft M.D.   On: 12/27/2013 13:56      IMPRESSION: 73 yo man with presumed stage T3 N0 adenocarcinoma of the lung amenable to chemoradiotherapy vs. Resection.  His pelvic lymph nodes raise some concern for post radiotherapy reactive adenopathy versus recurrence.  His history of non-healing rectal anastomosis supports the possibility of a chronic wound and benign hypermetabolic nodes.  PLAN:Today, I talked to the patient and family about the findings and work-up thus far.  We discussed the natural history of disease and general treatment, highlighting the role or radiotherapy in the management.  We discussed the available radiation techniques, and focused on the details of logistics and delivery.  We reviewed the anticipated acute and late sequelae associated with radiation in this setting.  The patient was encouraged to ask questions that I answered to the best of my ability.  I filled out a patient counseling form during our discussion including  treatment diagrams.  We retained a copy for our records.    He needs brain MRI to r/o brain mets.  He may need pelvic MRI versus EUS to assess his hypermetabolic pelvic adenopathy.  He will meet with thoracic surgery this afternoon to discuss whether he is a surgical candidate.  If he is not an optimal surgical candidate secondary to age and/or emphysema, we will move ahead with CT simulation for radiotherapy with or without radiosensitizing chemo.  I spent 30 minutes minutes face to face with the patient and more than 50% of that time was spent in counseling and/or coordination of care.    ------------------------------------------------  Sheral Apley [name], M.D.

## 2014-01-04 NOTE — Progress Notes (Signed)
Clarient fax stated ALK gen rearrangement not detected

## 2014-01-05 ENCOUNTER — Telehealth: Payer: Self-pay | Admitting: *Deleted

## 2014-01-05 NOTE — Telephone Encounter (Signed)
Gave wife appt time and place for Dr. Servando Snare 01/15/14 at 2:45.  She verbalized understanding of time and place of appt

## 2014-01-05 NOTE — Telephone Encounter (Signed)
Called pt with appt for PFT's and MRI Randy Gould.  He verbalized understanding of time and place of appt.

## 2014-01-07 NOTE — Progress Notes (Signed)
[name]       [place] [address]             [phone]                    Randy Gould [facility] Record #[id number] Date of Birth: 1940/12/28  Referring: Randy Gould., MD Primary Care: Randy Logan, MD  Chief Complaint:   Right chest wall pain   History of Present Illness:    Randy Gould 73 y.o. male is seen in the office   is a 73 y.o. male who was treated for stage T3 N1 rectal cancer at age 78 with LAR followed by chemoradiotherapy complicated by anastomosis breakdown requiring revision multiple surgeries as recently as 2005. This ultimately required colostomy for diversion. More recently, he presented with chest pain on right side. Chest X-Ray and subsequent CT angio showed a 5.7 cm right  upper lobe peripheral mass with likely chest wall invasion. PET scan confirmed hypermetabolisim there without mediastinal lymphadenopathy suggesting T3 N0 disease, with some hypermetabolic pelvic nodes of uncertain significance. CT biopsy of the lung mass shows adenocarcinoma. There is a small focus of hypermetabolic nodular pleural thickening in the left upper lobe measuring 7 mm (image 60) with mild metabolic activity (SUV max 2.6).    He was referred to our multidisciplinary clinic.  The patient continues to smoke up until today, PFT's are pending.  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    Current Activity/ Functional Status:  Patient is independent with mobility/ambulation, transfers, ADL's, IADL's.   Zubrod Score: At the time of surgery this patient's most appropriate activity status/level should be described as: []     0    Normal activity, no symptoms []     1    Restricted in physical strenuous activity but ambulatory, able to do out light work [x]     2    Ambulatory and capable of self care, unable to do work activities, up and about               >50 % of waking hours                              []      3    Only limited self care, in bed greater than 50% of waking hours []     4    Completely disabled, no self care, confined to bed or chair []     5    Moribund   Past Medical History  Diagnosis Date  . Cancer of colon 2003    Past Surgical History  Procedure Laterality Date  . Colon surgery      Family History  Problem Relation Age of Onset  . Cancer      stomach    History   Social History  . Marital Status: Married    Spouse Name: N/A    Number of Children: N/A  . Years of Education: N/A   Occupational History  . Not on file.   Social History Main Topics  . Smoking status: Current Every Day Smoker -- 1.00 packs/day for 30 years    Types: Cigarettes  . Smokeless tobacco: Not on file  . Alcohol Use: Yes     Comment: 1-2 drinks per weel  . Drug Use: No  . Sexual Activity: Not on file  History  Smoking status  . Current Every Day Smoker -- 1.00 packs/day for 30 years  . Types: Cigarettes  Smokeless tobacco  . Not on file    History  Alcohol Use  . Yes    Comment: 1-2 drinks per weel     Allergies  Allergen Reactions  . Ativan [Lorazepam] Other (See Comments)    REACTION: Increase heart rate  . Other Itching and Rash    States "head to toe" itching from a "powerful antibiotic" administered years ago.  Cannot recall name of antibiotic.    Current Outpatient Prescriptions  Medication Sig Dispense Refill  . acetaminophen (TYLENOL) 650 MG CR tablet Take 650 mg by mouth every 8 (eight) hours as needed for pain.      Marland Kitchen esomeprazole (NEXIUM) 20 MG capsule Take 20 mg by mouth daily at 12 noon.      . traMADol (ULTRAM) 50 MG tablet Take 50-100 mg by mouth every 4 (four) hours as needed for moderate pain.       No current facility-administered medications for this visit.     Review of Systems:     Cardiac Review of Systems: Y or N  Chest Pain [  n  ]  Resting SOB [ n  ] Exertional SOB  Blue.Reese  ]  Orthopnea [ y ]   Pedal Edema [  n ]      Palpitations [ n ] Syncope  [n  ]   Presyncope [n   ]  General Review of Systems: [Y] = yes [  ]=no Constitional: recent weight change [ n ];  Wt loss over the last 3 months [   ] anorexia [n  ]; fatigue [ y ]; nausea [n  ]; night sweats [n  ]; fever [  ]; or chillsn [  ];          Dental: poor dentition[  n]; Last Dentist visit:   Eye : blurred vision [ n ]; diplopia [   ]; vision changes [n  ];  Amaurosis fugax[  ]; Resp: cough [  ];  wheezing[ y ];  hemoptysis[ y ]; shortness of breath[y  ]; paroxysmal nocturnal dyspnea[y  ]; dyspnea on exertion[  y]; or orthopnea[  ];  GI:  gallstones[  ], vomiting[  ];  dysphagia[  ]; melena[  ];  hematochezia [  ]; heartburn[  ];   Hx of  Colonoscopy[  ]; GU: kidney stones [n  ]; hematuria[ n ];   dysuria [  ];  nocturia[n  ];  history of     obstruction [  ]; urinary frequency [  ]             Skin: rash, swelling[  ];, hair loss[  ];  peripheral edema[  ];  or itching[  ]; Musculosketetal: myalgias[  ];  joint swelling[  ];  joint erythema[  ];  joint pain[  ];  back pain[  ];  Heme/Lymph: bruising[  ];  bleeding[  ];  anemia[  ];  Neuro: TIA[ n ];  headaches[ n ];  stroke[  ];  vertigo[  ];  seizures[ n ];   paresthesias[  ];  difficulty walking[  ];  Psych:depression[  ]; anxiety[  ];  Endocrine: diabetes[  ];  thyroid dysfunction[  ];  Immunizations: Flu up to date [  n]; Pneumococcal up to date [  n];  Other:  Physical Exam: BP 123/75  Pulse 83  Temp(Src) 97.3 F (  36.3 C) (Oral)  Resp 18  Ht 5' 8"  (1.727 m)  Wt 125 lb 3.2 oz (56.79 kg)  BMI 19.04 kg/m2  PHYSICAL EXAMINATION:  General appearance: alert, cooperative, appears older than stated age, cachectic and no distress Neurologic: intact Heart: regular rate and rhythm, S1, S2 normal, no murmur, click, rub or gallop Lungs: diminished breath sounds bilaterally Abdomen: soft, non-tender; bowel sounds normal; no masses,  no organomegaly Extremities: extremities normal, atraumatic, no  cyanosis or edema and Homans sign is negative, no sign of DVT no cervical or axillary adenopthy   Diagnostic Studies & Laboratory data:     Recent Radiology Findings:   Dg Chest 1 View  12/27/2013   CLINICAL DATA:  Post right lung biopsy  EXAM: CHEST - 1 VIEW  COMPARISON:  Chest CT December 27, 2013  FINDINGS: There is no demonstrable pneumothorax. There is a mass in the periphery of the right upper lobe near the apex. There is underlying emphysematous change. There is no frank edema or consolidation. Heart size is normal. Pulmonary vascularity reflects underlying emphysema. No adenopathy.  IMPRESSION: No pneumothorax apparent. Mass periphery right upper lobe. Underlying emphysema.   Electronically Signed   By: Lowella Grip M.D.   On: 12/27/2013 14:20   Ct Angio Chest Pe W/cm &/or Wo Cm  12/12/2013   CLINICAL DATA:  Right upper chest burning sensation or 3 weeks question pulmonary embolism, history colon cancer  EXAM: CT ANGIOGRAPHY CHEST WITH CONTRAST  TECHNIQUE: Multidetector CT imaging of the chest was performed using the standard protocol during bolus administration of intravenous contrast. Multiplanar CT image reconstructions including MIPs were obtained to evaluate the vascular anatomy.  CONTRAST:  33m OMNIPAQUE IOHEXOL 350 MG/ML SOLN  COMPARISON:  CT chest with contrast 03/05/2011  FINDINGS: Atherosclerotic calcifications aorta without aneurysm or dissection.  Pulmonary arteries patent.  No evidence of pulmonary embolism.  No thoracic adenopathy.  Nonspecific 6 mm low-attenuation focus last segment left lobe liver image 98 unchanged.  Remaining visualized portions of upper abdomen normal appearance.  Peripheral mass identified at the lateral aspect of the right upper hemithorax, contiguous with the pleural surface over a broad base, more confluent and mass-like in appearance versus the previous exam.  Finding is worrisome for a Pancoast tumor measuring 5.7 x 2.7 x 4.6 cm  Suspected associated  bone destruction of the lateral aspect of the right 3rd rib.  Underlying COPD changes with calcified granuloma at the right upper lobe.  Peripheral areas of scarring in both lungs predominantly upper lobes.  Subsegmental atelectasis right lower lobe.  Remaining lungs clear.  No pleural effusion or pneumothorax.  No additional osseous findings.  Review of the MIP images confirms the above findings.  IMPRESSION: No evidence of pulmonary embolism.  Peripheral tumor with broad pleural base at the lateral aspect of the right upper lobe 5.7 x 2.7 x 4.6 cm in size worrisome for a Pancoast tumor; recommend tissue diagnosis with PET-CT imaging. .  Associated bone destruction of the lateral aspect of the right 3rd rib is suspected.  Severe underlying COPD and old granulomatous disease.   Electronically Signed   By: MLavonia DanaM.D.   On: 12/12/2013 15:25   Nm Pet Image Initial (pi) Skull Base To Thigh  12/21/2013   CLINICAL DATA:  Initial treatment strategy for lung mass. Prior history of colon cancer.  EXAM: NUCLEAR MEDICINE PET SKULL BASE TO THIGH  FASTING BLOOD GLUCOSE:  Value: 926mdl  TECHNIQUE: 19.5 mCi F-18 FDG was injected intravenously.  CT data was obtained and used for attenuation correction and anatomic localization only. (This was not acquired as a diagnostic CT examination.) Additional exam technical data entered on technologist worksheet.  COMPARISON:  CT ANGIO CHEST W/CM &/OR WO/CM dated 12/12/2013; DG CHEST 2 VIEW dated 12/08/2013; CT CHEST W/CM dated 03/05/2011  FINDINGS: NECK  No hypermetabolic lymph nodes in the neck.  CHEST  There is a hypermetabolic mass in the lateral right upper lobe measuring 5.1 x 2.7 cm with intense metabolic activity (SUV max 16.8). This mass shares a broad surface with the chest wall and appears to involve adjacent ribs. There is a small focus of hypermetabolic nodular pleural thickening in the left upper lobe measuring 7 mm (image 60) with mild metabolic activity (SUV max 2.6).  There is extensive centrilobular emphysema with upper lobe predominance. No hypermetabolic mediastinal lymph nodes.  ABDOMEN/PELVIS  There is asymmetric hypermetabolic activity associated with the lower rectal region at site of prior surgical resection and surgical clips. Difficult to define the tissue planes at this level due to CT technique and lack of contrast. Hypermetabolic activity is seen on CT image 206 just anterior to the sacrum on the right. Second nodular focus of hypermetabolic activity in the deep pelvis just right of midline on image 194 without clear definable lesion on the CT portion. A thirdfocus of activity is noted medial to the left psoas muscle (image 183. This could represent ureteral activity. There is physiologic activity noted in the left lower quadrant colostomy. No abnormal metabolic activity within the liver or adrenal glands.  SKELETON  No focal hypermetabolic activity to suggest skeletal metastasis.  IMPRESSION: 1. Hypermetabolic right upper lobe mass the chest wall is concerning for bronchogenic carcinoma. Recommend tissue sampling. 2. Hypermetabolic mild nodular thickening in the left upper lobe is less specific and may be inflammatory. Recommend attention on follow-up. 3. No hypermetabolic mediastinal lymph nodes. 4. Asymmetric hypermetabolic activity in the deep right pelvis adjacent to the sacrum with differential including local recurrence versus potential deep pelvic infection or physiologic activity. Two additional foci of activity in the pelvis could represent local metastasis or physiologic activity. Recommend correlation with CEA and if elevated recommend contrast-enhanced CT of the pelvis for further evaluation.   Electronically Signed   By: Suzy Bouchard M.D.   On: 12/21/2013 16:20   Ct Biopsy  12/27/2013   CLINICAL DATA:  Right lung lesion.  Tissue diagnosis is needed.  EXAM: CT-GUIDED BIOPSY OF RIGHT LUNG LESION  Physician: Stephan Minister. Henn, MD  MEDICATIONS: Versed 2  mg, fentanyl 50 mcg. A radiology nurse monitored the patient for moderate sedation.  ANESTHESIA/SEDATION: Sedation time: 29 min  PROCEDURE: The procedure was explained to the patient. The risks and benefits of the procedure were discussed and the patient's questions were addressed. Informed consent was obtained from the patient. Patient was placed supine on the CT scanner. Images through the chest were obtained. The lesion in the periphery of the right upper lobe was identified. The patient's right axilla was shaved. The right axilla was prepped and draped in sterile fashion. Skin was anesthetized with 1% lidocaine. Using CT guidance, a 17 gauge needle was directed into the peripheral lesion. Needle placement was confirmed within the lesion. A total of 3 core biopsies were performed with an 18 gauge core device. Two adequate specimens were obtained. Samples were placed in formalin. 17 gauge needle was removed without complication. A small bandage was placed over the puncture site.  COMPLICATIONS: None  FINDINGS: Pleural  based lesion in the lateral right upper lobe. There is mild irregularity of the adjacent right third rib which may be contributing to the patient's symptoms in this area. Patient has diffuse centrilobular emphysema. Needle placement confirmed within the lesion. No evidence for a pneumothorax following the core biopsies.  IMPRESSION: CT-guided core biopsies of the right lung mass.   Electronically Signed   By: Markus Daft M.D.   On: 12/27/2013 13:56    PATH: Diagnosis Lung, needle/core biopsy(ies), Right upper lobe - ADENOCARCINOMA. - SEE COMMENT. Microscopic Comment Dr. Lynnell Chad has reviewed the case and concurs with this interpretation. EGFR and ALK testing will be sent out and the results reported separately.  Recent Lab Findings: Lab Results  Component Value Date   WBC 10.9* 12/27/2013   HGB 12.7* 12/27/2013   HCT 37.4* 12/27/2013   PLT 450* 12/27/2013   GLUCOSE 90 12/08/2013   ALT  12 12/08/2013   AST 19 12/08/2013   NA 140 12/08/2013   K 5.1 12/08/2013   CL 104 12/08/2013   CREATININE 1.1 12/08/2013   BUN 15 12/08/2013   CO2 27 12/08/2013   INR 0.99 12/27/2013      Assessment / Plan:   1. 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 There is a small focus of hypermetabolic nodular pleural thickening in the left upper lobe       measuring 7 mm (image 60) with mild metabolic activity (SUV max        2.6).   3.extensive centrilobular emphysema with upper lobe predominance.- patient has had no  PFT , but has extensive pulmonary charges  on CT and is symptomatically SOB with exertion and continues to smoke. Will check full PFT's to evacuate respiratory reserve.  Will see back one week after test done and reevaluate with Radiation Oncology/ Dr [name] about definitive radiation RX vs radiation and then consider chest wall resection    I spent 40 minutes counseling the patient face to face. The total time spent in the appointment was 60 minutes.  Grace Isaac MD      [address] [place] [address] Office [phone]   Beeper [phone]

## 2014-01-09 ENCOUNTER — Ambulatory Visit
Admission: RE | Admit: 2014-01-09 | Discharge: 2014-01-09 | Disposition: A | Discharge status: Home / Self Care | Payer: Medicare Other | Source: Ambulatory Visit | Attending: Cardiothoracic Surgery | Admitting: Cardiothoracic Surgery

## 2014-01-09 DIAGNOSIS — R918 Other nonspecific abnormal finding of lung field: Secondary | ICD-10-CM

## 2014-01-09 MED ORDER — GADOBENATE DIMEGLUMINE 529 MG/ML IV SOLN
10.0000 mL | Freq: Once | INTRAVENOUS | Status: AC | PRN
  Administered 2014-01-09: 10 mL via INTRAVENOUS

## 2014-01-10 ENCOUNTER — Ambulatory Visit (HOSPITAL_COMMUNITY)
Admission: RE | Admit: 2014-01-10 | Discharge: 2014-01-10 | Disposition: A | Discharge status: Home / Self Care | Payer: Medicare Other | Source: Ambulatory Visit | Attending: Cardiothoracic Surgery | Admitting: Cardiothoracic Surgery

## 2014-01-10 DIAGNOSIS — R222 Localized swelling, mass and lump, trunk: Secondary | ICD-10-CM | POA: Insufficient documentation

## 2014-01-10 DIAGNOSIS — R918 Other nonspecific abnormal finding of lung field: Secondary | ICD-10-CM

## 2014-01-10 DIAGNOSIS — F17200 Nicotine dependence, unspecified, uncomplicated: Secondary | ICD-10-CM | POA: Insufficient documentation

## 2014-01-10 LAB — PULMONARY FUNCTION TEST
DL/VA % pred: 73 %
DL/VA: 3.31 ml/min/mmHg/L
DLCO cor % pred: 76 %
DLCO cor: 22.69 ml/min/mmHg
DLCO unc % pred: 72 %
DLCO unc: 21.37 ml/min/mmHg
FEF 25-75 Post: 1.93 L/sec
FEF 25-75 Pre: 1.47 L/sec
FEF2575-%Change-Post: 31 %
FEF2575-%Pred-Post: 88 %
FEF2575-%Pred-Pre: 67 %
FEV1-%Change-Post: 9 %
FEV1-%Pred-Post: 92 %
FEV1-%Pred-Pre: 84 %
FEV1-Post: 2.7 L
FEV1-Pre: 2.47 L
FEV1FVC-%Change-Post: 4 %
FEV1FVC-%Pred-Pre: 90 %
FEV6-%Change-Post: 4 %
FEV6-%Pred-Post: 104 %
FEV6-%Pred-Pre: 99 %
FEV6-Post: 3.93 L
FEV6-Pre: 3.75 L
FEV6FVC-%Pred-Post: 106 %
FEV6FVC-%Pred-Pre: 106 %
FVC-%Change-Post: 4 %
FVC-%Pred-Post: 97 %
FVC-%Pred-Pre: 93 %
FVC-Post: 3.93 L
FVC-Pre: 3.75 L
Post FEV1/FVC ratio: 69 %
Post FEV6/FVC ratio: 100 %
Pre FEV1/FVC ratio: 66 %
Pre FEV6/FVC Ratio: 100 %
RV % pred: 177 %
RV: 4.22 L
TLC % pred: 123 %
TLC: 8.22 L

## 2014-01-10 MED ORDER — ALBUTEROL SULFATE (2.5 MG/3ML) 0.083% IN NEBU
2.5000 mg | INHALATION_SOLUTION | Freq: Once | RESPIRATORY_TRACT | Status: AC
  Administered 2014-01-10: 2.5 mg via RESPIRATORY_TRACT

## 2014-01-11 ENCOUNTER — Other Ambulatory Visit: Payer: Medicare Other

## 2014-01-15 ENCOUNTER — Encounter (INDEPENDENT_AMBULATORY_CARE_PROVIDER_SITE_OTHER): Payer: Self-pay

## 2014-01-15 ENCOUNTER — Ambulatory Visit (INDEPENDENT_AMBULATORY_CARE_PROVIDER_SITE_OTHER): Payer: Medicare Other | Admitting: Cardiothoracic Surgery

## 2014-01-15 ENCOUNTER — Encounter: Payer: Self-pay | Admitting: Cardiothoracic Surgery

## 2014-01-15 VITALS — BP 96/60 | HR 86 | Resp 20 | Ht 68.0 in | Wt 123.0 lb

## 2014-01-15 DIAGNOSIS — R918 Other nonspecific abnormal finding of lung field: Secondary | ICD-10-CM

## 2014-01-15 DIAGNOSIS — R222 Localized swelling, mass and lump, trunk: Secondary | ICD-10-CM

## 2014-01-15 MED ORDER — TRAMADOL HCL 50 MG PO TABS: 50.0000 mg | ORAL_TABLET | ORAL | Status: DC | PRN

## 2014-01-15 NOTE — Progress Notes (Signed)
[address]       [place] [address]             [phone]                      Randy Gould [place] Medical Record #[id number] Date of Birth: [birth year]  Referring: Dr [name]  Primary Care: [name], MD  Chief Complaint:   Right chest wall pain   History of Present Illness:    Randy Gould 73 y.o. male is seen in the office for rt chest wall pain with chest wall and lung mass. The patient was treated for stage T3 N1 rectal cancer at age 65 with LAR followed by chemoradiotherapy complicated by anastomosis breakdown requiring revision multiple surgeries as recently as 2005. This ultimately required colostomy for diversion. More recently, he presented with chest pain on right side. Chest X-Ray and subsequent CT angio showed a 5.7 cm right  upper lobe peripheral mass with likely chest wall invasion. PET scan confirmed hypermetabolisim there without mediastinal lymphadenopathy suggesting T3 N0 disease, with some hypermetabolic pelvic nodes of uncertain significance. CT biopsy of the lung mass shows adenocarcinoma. ALK & EGFR  Neagtive.  There is a small focus of hypermetabolic nodular pleural thickening in the left upper lobe measuring 7 mm (image 60) with mild metabolic activity (SUV max 2.6).  He was referred to [facility] . Since that visit the patient has had MRI of the brain and full set of pulmonary function studies to evaluate for possible resection.  Patient has remained smoke free for the last 10 days..  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    Current Activity/ Functional Status:  Patient is independent with mobility/ambulation, transfers, ADL's, IADL's.   Zubrod Score: At the time of surgery this patient's most appropriate activity status/level should be described as: []     0    Normal activity, no symptoms [x]     1    Restricted in physical strenuous activity but ambulatory, able to do out light work []     2     Ambulatory and capable of self care, unable to do work activities, up and about               >50 % of waking hours                              []     3    Only limited self care, in bed greater than 50% of waking hours []     4    Completely disabled, no self care, confined to bed or chair []     5    Moribund   Past Medical History  Diagnosis Date  . Cancer of colon 2003    Past Surgical History  Procedure Laterality Date  . Colon surgery      Family History  Problem Relation Age of Onset  . Cancer      stomach    History   Social History  . Marital Status: Married    Spouse Name: N/A    Number of Children: N/A  . Years of Education: N/A   Occupational History  . Not on file.   Social History Main Topics  . Smoking status: Current Every Day Smoker -- 1.00 packs/day for 30 years    Types: Cigarettes  . Smokeless tobacco: Not  on file  . Alcohol Use: Yes     Comment: 1-2 drinks per weel  . Drug Use: No  . Sexual Activity: Not on file           History  Smoking status  . Current Every Day Smoker -- 1.00 packs/day for 30 years  . Types: Cigarettes  Smokeless tobacco  . Not on file    History  Alcohol Use  . Yes    Comment: 1-2 drinks per weel     Allergies  Allergen Reactions  . Ativan [Lorazepam] Other (See Comments)    REACTION: Increase heart rate  . Other Itching and Rash    States "head to toe" itching from a "powerful antibiotic" administered years ago.  Cannot recall name of antibiotic.    Current Outpatient Prescriptions  Medication Sig Dispense Refill  . acetaminophen (TYLENOL) 650 MG CR tablet Take 650 mg by mouth every 8 (eight) hours as needed for pain.      Marland Kitchen esomeprazole (NEXIUM) 20 MG capsule Take 20 mg by mouth daily at 12 noon.      . traMADol (ULTRAM) 50 MG tablet Take 50-100 mg by mouth every 4 (four) hours as needed for moderate pain.       No current facility-administered medications for this visit.     Review of Systems:       Cardiac Review of Systems: Y or N  Chest Pain [  n  ]  Resting SOB [ n  ] Exertional SOB  Blue.Reese  ]  Orthopnea [ y ]   Pedal Edema [  n ]    Palpitations [ n ] Syncope  [n  ]   Presyncope [n   ]  General Review of Systems: [Y] = yes [  ]=no Constitional: recent weight change [ n ];  Wt loss over the last 3 months [   ] anorexia [n  ]; fatigue [ y ]; nausea [n  ]; night sweats [n  ]; fever [  ]; or chillsn [  ];          Dental: poor dentition[  n]; Last Dentist visit:   Eye : blurred vision [ n ]; diplopia [   ]; vision changes [n  ];  Amaurosis fugax[  ]; Resp: cough [  ];  wheezing[ y ];  hemoptysis[ y ]; shortness of breath[y  ]; paroxysmal nocturnal dyspnea[y  ]; dyspnea on exertion[  y]; or orthopnea[  ];  GI:  gallstones[  ], vomiting[  ];  dysphagia[  ]; melena[  ];  hematochezia [  ]; heartburn[  ];   Hx of  Colonoscopy[  ]; GU: kidney stones [n  ]; hematuria[ n ];   dysuria [  ];  nocturia[n  ];  history of     obstruction [  ]; urinary frequency [  ]             Skin: rash, swelling[  ];, hair loss[  ];  peripheral edema[  ];  or itching[  ]; Musculosketetal: myalgias[  ];  joint swelling[  ];  joint erythema[  ];  joint pain[  ];  back pain[  ];  Heme/Lymph: bruising[  ];  bleeding[  ];  anemia[  ];  Neuro: TIA[ n ];  headaches[ n ];  stroke[  ];  vertigo[  ];  seizures[ n ];   paresthesias[  ];  difficulty walking[  ];  Psych:depression[  ]; anxiety[  ];  Endocrine:  diabetes[  ];  thyroid dysfunction[  ];  Immunizations: Flu up to date [  n]; Pneumococcal up to date [  n];  Other:  Physical Exam: BP 96/60  Pulse 86  Resp 20  Ht 5' 8"  (1.727 m)  Wt 123 lb (55.792 kg)  BMI 18.71 kg/m2  SpO2 96%  PHYSICAL EXAMINATION:  General appearance: alert, cooperative, appears older than stated age, cachectic and no distress Neurologic: intact Heart: regular rate and rhythm, S1, S2 normal, no murmur, click, rub or gallop Lungs: diminished breath sounds bilaterally Abdomen: soft,  non-tender; bowel sounds normal; no masses,  no organomegaly Extremities: extremities normal, atraumatic, no cyanosis or edema and Homans sign is negative, no sign of DVT no cervical or axillary adenopthy   Diagnostic Studies & Laboratory data:     Recent Radiology Findings:  Mr Kizzie Fantasia Contrast  01/09/2014   CLINICAL DATA:  Lung cancer.  Remote history of colon cancer.  EXAM: MRI HEAD WITHOUT AND WITH CONTRAST  TECHNIQUE: Multiplanar, multiecho pulse sequences of the brain and surrounding structures were obtained without and with intravenous contrast.  CONTRAST:  84m MULTIHANCE GADOBENATE DIMEGLUMINE 529 MG/ML IV SOLN  COMPARISON:  CT scan 12/21/2013.  FINDINGS: No acute infarct, hemorrhage, or mass lesion is present. Postcontrast images demonstrate no pathologic enhancement to suggest metastatic disease of the brain or meninges. Midline structures are within normal limits.  Flow is present in the major intracranial arteries. The left lens has been replaced. The globes and orbits are otherwise intact. The a lateral recess of the right sphenoid sinus is opacified. The paranasal sinuses and mastoid air cells are otherwise clear.  IMPRESSION: 1. No evidence for metastatic disease the brain or meninges. 2. MRI appearance of the brain for age 89 Opacification of the lateral recess of the right sphenoid sinus.   Electronically Signed   By: CLawrence SantiagoM.D.   On: 01/09/2014 14:42     Ct Angio Chest Pe W/cm &/or Wo Cm  12/12/2013   CLINICAL DATA:  Right upper chest burning sensation or 3 weeks question pulmonary embolism, history colon cancer  EXAM: CT ANGIOGRAPHY CHEST WITH CONTRAST  TECHNIQUE: Multidetector CT imaging of the chest was performed using the standard protocol during bolus administration of intravenous contrast. Multiplanar CT image reconstructions including MIPs were obtained to evaluate the vascular anatomy.  CONTRAST:  858mOMNIPAQUE IOHEXOL 350 MG/ML SOLN  COMPARISON:  CT chest with  contrast 03/05/2011  FINDINGS: Atherosclerotic calcifications aorta without aneurysm or dissection.  Pulmonary arteries patent.  No evidence of pulmonary embolism.  No thoracic adenopathy.  Nonspecific 6 mm low-attenuation focus last segment left lobe liver image 98 unchanged.  Remaining visualized portions of upper abdomen normal appearance.  Peripheral mass identified at the lateral aspect of the right upper hemithorax, contiguous with the pleural surface over a broad base, more confluent and mass-like in appearance versus the previous exam.  Finding is worrisome for a Pancoast tumor measuring 5.7 x 2.7 x 4.6 cm  Suspected associated bone destruction of the lateral aspect of the right 3rd rib.  Underlying COPD changes with calcified granuloma at the right upper lobe.  Peripheral areas of scarring in both lungs predominantly upper lobes.  Subsegmental atelectasis right lower lobe.  Remaining lungs clear.  No pleural effusion or pneumothorax.  No additional osseous findings.  Review of the MIP images confirms the above findings.  IMPRESSION: No evidence of pulmonary embolism.  Peripheral tumor with broad pleural base at the lateral aspect of  the right upper lobe 5.7 x 2.7 x 4.6 cm in size worrisome for a Pancoast tumor; recommend tissue diagnosis with PET-CT imaging. .  Associated bone destruction of the lateral aspect of the right 3rd rib is suspected.  Severe underlying COPD and old granulomatous disease.   Electronically Signed   By: Lavonia Dana M.D.   On: 12/12/2013 15:25   Nm Pet Image Initial (pi) Skull Base To Thigh  12/21/2013   CLINICAL DATA:  Initial treatment strategy for lung mass. Prior history of colon cancer.  EXAM: NUCLEAR MEDICINE PET SKULL BASE TO THIGH  FASTING BLOOD GLUCOSE:  Value: 52m/dl  TECHNIQUE: 19.5 mCi F-18 FDG was injected intravenously. CT data was obtained and used for attenuation correction and anatomic localization only. (This was not acquired as a diagnostic CT examination.)  Additional exam technical data entered on technologist worksheet.  COMPARISON:  CT ANGIO CHEST W/CM &/OR WO/CM dated 12/12/2013; DG CHEST 2 VIEW dated 12/08/2013; CT CHEST W/CM dated 03/05/2011  FINDINGS: NECK  No hypermetabolic lymph nodes in the neck.  CHEST  There is a hypermetabolic mass in the lateral right upper lobe measuring 5.1 x 2.7 cm with intense metabolic activity (SUV max 16.8). This mass shares a broad surface with the chest wall and appears to involve adjacent ribs. There is a small focus of hypermetabolic nodular pleural thickening in the left upper lobe measuring 7 mm (image 60) with mild metabolic activity (SUV max 2.6). There is extensive centrilobular emphysema with upper lobe predominance. No hypermetabolic mediastinal lymph nodes.  ABDOMEN/PELVIS  There is asymmetric hypermetabolic activity associated with the lower rectal region at site of prior surgical resection and surgical clips. Difficult to define the tissue planes at this level due to CT technique and lack of contrast. Hypermetabolic activity is seen on CT image 206 just anterior to the sacrum on the right. Second nodular focus of hypermetabolic activity in the deep pelvis just right of midline on image 194 without clear definable lesion on the CT portion. A thirdfocus of activity is noted medial to the left psoas muscle (image 183. This could represent ureteral activity. There is physiologic activity noted in the left lower quadrant colostomy. No abnormal metabolic activity within the liver or adrenal glands.  SKELETON  No focal hypermetabolic activity to suggest skeletal metastasis.  IMPRESSION: 1. Hypermetabolic right upper lobe mass the chest wall is concerning for bronchogenic carcinoma. Recommend tissue sampling. 2. Hypermetabolic mild nodular thickening in the left upper lobe is less specific and may be inflammatory. Recommend attention on follow-up. 3. No hypermetabolic mediastinal lymph nodes. 4. Asymmetric hypermetabolic activity  in the deep right pelvis adjacent to the sacrum with differential including local recurrence versus potential deep pelvic infection or physiologic activity. Two additional foci of activity in the pelvis could represent local metastasis or physiologic activity. Recommend correlation with CEA and if elevated recommend contrast-enhanced CT of the pelvis for further evaluation.   Electronically Signed   By: SSuzy BouchardM.D.   On: 12/21/2013 16:20   Ct Biopsy  12/27/2013   CLINICAL DATA:  Right lung lesion.  Tissue diagnosis is needed.  EXAM: CT-GUIDED BIOPSY OF RIGHT LUNG LESION  Physician: AStephan Minister Henn, MD  MEDICATIONS: Versed 2 mg, fentanyl 50 mcg. A radiology nurse monitored the patient for moderate sedation.  ANESTHESIA/SEDATION: Sedation time: 29 min  PROCEDURE: The procedure was explained to the patient. The risks and benefits of the procedure were discussed and the patient's questions were addressed. Informed consent was obtained  from the patient. Patient was placed supine on the CT scanner. Images through the chest were obtained. The lesion in the periphery of the right upper lobe was identified. The patient's right axilla was shaved. The right axilla was prepped and draped in sterile fashion. Skin was anesthetized with 1% lidocaine. Using CT guidance, a 17 gauge needle was directed into the peripheral lesion. Needle placement was confirmed within the lesion. A total of 3 core biopsies were performed with an 18 gauge core device. Two adequate specimens were obtained. Samples were placed in formalin. 17 gauge needle was removed without complication. A small bandage was placed over the puncture site.  COMPLICATIONS: None  FINDINGS: Pleural based lesion in the lateral right upper lobe. There is mild irregularity of the adjacent right third rib which may be contributing to the patient's symptoms in this area. Patient has diffuse centrilobular emphysema. Needle placement confirmed within the lesion. No  evidence for a pneumothorax following the core biopsies.  IMPRESSION: CT-guided core biopsies of the right lung mass.   Electronically Signed   By: Markus Daft M.D.   On: 12/27/2013 13:56    PATH: Diagnosis Lung, needle/core biopsy(ies), Right upper lobe - ADENOCARCINOMA. - SEE COMMENT. Microscopic Comment Dr. Lynnell Chad has reviewed the case and concurs with this interpretation. EGFR and ALK  Both negative   Recent Lab Findings: Lab Results  Component Value Date   WBC 10.9* 12/27/2013   HGB 12.7* 12/27/2013   HCT 37.4* 12/27/2013   PLT 450* 12/27/2013   GLUCOSE 90 12/08/2013   ALT 12 12/08/2013   AST 19 12/08/2013   NA 140 12/08/2013   K 5.1 12/08/2013   CL 104 12/08/2013   CREATININE 1.1 12/08/2013   BUN 15 12/08/2013   CO2 27 12/08/2013   INR 0.99 12/27/2013   PFT's: FEV1 2.47  84%   DLCO 21.37   72%   Assessment / Plan:   1. 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 I discussed with the patient the possibility of surgical resection including chest wall resection. At this point it would seem reasonable to treat this mass as a Pancoast tumor/ consider preoperative radiation to the chest wall with subsequent surgical resection.   2. There is a small focus of hypermetabolic nodular pleural thickening in the left upper lobe       measuring 7 mm (image 60) with mild metabolic activity (SUV max        2.6). This appears unchanged from a CT scan 2012  3.extensive centrilobular emphysema with upper lobe predominance, the patient's pulmonary function studies are noted above. He has remained off cigarettes for the past 2 weeks.   Grace Isaac MD      [address] [place] [address] Office [phone]   Beeper [phone]

## 2014-01-16 ENCOUNTER — Ambulatory Visit (HOSPITAL_COMMUNITY): Payer: Medicare Other

## 2014-01-16 NOTE — Addendum Note (Signed)
Encounter addended by: Lora Paula, MD on: 01/16/2014  8:54 AM<BR>     Documentation filed: Notes Section

## 2014-01-17 ENCOUNTER — Encounter: Payer: Self-pay | Admitting: Internal Medicine

## 2014-01-17 ENCOUNTER — Ambulatory Visit (INDEPENDENT_AMBULATORY_CARE_PROVIDER_SITE_OTHER): Payer: Medicare Other | Admitting: Internal Medicine

## 2014-01-17 ENCOUNTER — Telehealth: Payer: Self-pay | Admitting: Pulmonary Disease

## 2014-01-17 VITALS — BP 112/62 | HR 109 | Temp 97.5°F | Ht 69.0 in | Wt 119.4 lb

## 2014-01-17 DIAGNOSIS — R222 Localized swelling, mass and lump, trunk: Secondary | ICD-10-CM

## 2014-01-17 DIAGNOSIS — R918 Other nonspecific abnormal finding of lung field: Secondary | ICD-10-CM

## 2014-01-17 MED ORDER — OXYCODONE HCL ER 20 MG PO T12A: 20.0000 mg | EXTENDED_RELEASE_TABLET | Freq: Two times a day (BID) | ORAL | Status: DC

## 2014-01-17 MED ORDER — OXYCODONE HCL 5 MG PO TABS: ORAL_TABLET | ORAL | Status: DC

## 2014-01-17 NOTE — Patient Instructions (Signed)
oxycontin 20 mg one twice daily   For breakthrough pain > oxyir 5 mg one -two every 3 hours   Once you start the radiation, all pain meds per Dr [name]

## 2014-01-17 NOTE — Telephone Encounter (Signed)
NA

## 2014-01-17 NOTE — Progress Notes (Signed)
   Subjective:    Patient ID: Randy Gould, male    DOB: 1940-12-23    MRN: [id number]     Randy Gould primary  Brief patient profile:  61 yowm active smoker with h/o colon ca in 2003/4 with chemo and RT self referred 12/08/2013 to pulmonary clinic with new chest pain onset first week in Dec 2014 and R Chest wall mass on cxr/ct.  History of Present Illness  12/08/2013 1st [facility] office visit/ Randy Gould cc new R Ant cp rad around under axilla to post chest no worse with deep breath burning sensation assoc with cough prod slt gray mucus - pain is continuous since abrupt onset early Dec 2014  but does crescendo s pattern severe spells lasting just a period of a few seconds somewhat positional by not really pleuritic. Has not seen primary or tried nsaids. rec IR Bx > adenoca > MTOC > plan RT then maybe resection  01/17/2014 f/u ov/Randy Gould re: pain management  Chief Complaint  Patient presents with  . Acute Visit    Pt states breathing is no better since last visit. He states has no appetite and is still bothered with CP. The tramadol makes him nauseated and only helps minimally with pain.    Still able to drink liquids but no appetite for solids  No obvious day to day or daytime variabilty or assoc chronic cough  or chest tightness, subjective wheeze overt sinus or hb symptoms. No unusual exp hx or h/o childhood pna/ asthma or knowledge of premature birth.  Sleeping ok without nocturnal  or early am exacerbation  of respiratory  c/o's or need for noct saba. Also denies any obvious fluctuation of symptoms with weather or environmental changes or other aggravating or alleviating factors except as outlined above   Current Medications, Allergies, Complete Past Medical History, Past Surgical History, Family History, and Social History were reviewed in [employer] record.  ROS  The following are not active complaints unless bolded sore throat, dysphagia, dental  problems, itching, sneezing,  nasal congestion or excess/ purulent secretions, ear ache,   fever, chills, sweats, unintended wt loss, pleuritic or exertional cp, hemoptysis,  orthopnea pnd or leg swelling, presyncope, palpitations, heartburn, abdominal pain, anorexia, nausea, vomiting, diarrhea  or change in bowel or urinary habits, change in stools or urine, dysuria,hematuria,  rash, arthralgias, visual complaints, headache, numbness weakness or ataxia or problems with walking or coordination,  change in mood/affect or memory.            Objective:   Physical Exam  Wt Readings from Last 3 Encounters:  01/17/14 119 lb 6.4 oz (54.159 kg)  01/15/14 123 lb (55.792 kg)  01/04/14 125 lb 3.2 oz (56.79 kg)        HEENT mild turbinate edema.  Oropharynx no thrush or excess pnd or cobblestoning.  No JVD or cervical adenopathy. Mild accessory muscle hypertrophy. Trachea midline, nl thryroid. Chest was hyperinflated by percussion with diminished breath sounds and moderate increased exp time without wheeze. Hoover sign positive at mid inspiration. Regular rate and rhythm without murmur gallop or rub or increase P2 or edema.  Abd: no hsm, nl excursion. Ext warm without cyanosis or clubbing.        CXR  12/08/2013 :  Chronic changes right upper lobe. COPD and underlying changes of pulmonary fibrosis. Otherwise no acute cardiopulmonary abnormalities.  Labs 12/08/13 ok except ESR 97     Assessment & Plan:

## 2014-01-18 ENCOUNTER — Telehealth: Payer: Self-pay | Admitting: Internal Medicine

## 2014-01-18 ENCOUNTER — Telehealth: Payer: Self-pay

## 2014-01-18 ENCOUNTER — Other Ambulatory Visit: Payer: Self-pay | Admitting: Radiation Oncology

## 2014-01-18 MED ORDER — OXYCODONE HCL 5 MG PO TABS: ORAL_TABLET | ORAL | Status: DC

## 2014-01-18 NOTE — Telephone Encounter (Signed)
Received PA form from [facility] for Oxycodone HCL 5 mg tablets  Called [phone] to obtain PA  Was on hold for over 10 min  WCB this afternoon first thing

## 2014-01-18 NOTE — Progress Notes (Signed)
  Radiation Oncology         [phone] ________________________________  Name: Randy Gould MRN: [id number]  Date: 01/18/2014  DOB: 04-Sep-1941  Chart Note:  I reviewed this patient's most recent findings and wanted to take a minute to document my impression.  He has seen Dr. Servando Snare and Dr. [name] this week.  The consensus is to proceed with pre-op radiotherapy and possible resection.  In light of this information, I called the patient and scheduled CT simulation for 8 am Monday 2/16.  ________________________________  Sheral Apley. [name], M.D.

## 2014-01-18 NOTE — Telephone Encounter (Signed)
Reviewed with the patient.  He has not had a colonoscopy or GI follow up since 2005.  He is scheduled for a colon 03/16/14 4:00 in the [facility].  He will come for a pre-visit 03/05/14         Equan Cogbill, Please contact this patient from Dr. Johny Shears request. He apparently has not had GI follow up since 2005 when I last saw him. If he has had GI follow up after me we need to determine where he wants his follow up. He needs a colonoscopy scheduled. Thx. MS            ----- Message -----    From: Lora Paula, MD    Sent: 01/17/2014 8:50 PM    To: Milus Banister, MD, Ladene Artist, MD        Dan,        My impression is that he was not seeing anyone, so, it would be great if you could see him.        Matt            ----- Message -----    From: Milus Banister, MD    Sent: 01/17/2014 10:23 AM    To: Ladene Artist, MD, Lora Paula, MD        [facility],    He should definitely have flex sig and colonoscopy via ostomy if he hasn't in a while (3-5 years). Where has he been getting his GI care (if any at all) since his complicated rectal cancer, surgeries, etc?         If he has been seeing a GI doc for rectal cancer follow up since his surgeries, he should follow up with them about the PET findings however. If he has not seen anyone (or just wants to change care) I am happy to assume his care, would want to meet him in the office first to try to gather more information about his rectal cancer care, any follow up.         Thanks            dj                ----- Message -----    From: Lora Paula, MD    Sent: 01/12/2014 10:46 AM    To: Milus Banister, MD, Ladene Artist, MD        Thanks Norberto Sorenson        I am copying Linna Hoff.        Matt            ----- Message -----    From: Ladene Artist, MD    Sent: 01/08/2014 10:03 AM    To: Lora Paula, MD        Matt,        This patient has not returned for recommended follow up colonoscopies with me. I last saw him  at his last colonoscopy in 2005. He should have a colonoscopy if he has not had one in 3 years. If he has had colonoscopies done since I last saw him in 2005 he should return to the gastroenterologist that is now following him. EUS might be helpful but not sure as I do not perform EUS. I can get an opinion from Oretha Caprice about EUS.         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 He had T3 N1 rectal cancer in 1997 treated with LAR, XRT and chemo and later suffered multiple dehiscences of his anastomosis ultimately requiring colostomy. So, his PET findings may represent hypermetabolism in the pelvis from a chronic wound. But, he has not been scoped recently. Do you think sigmoidoscopy or EUS might be helpful to rule out recurrence would be appropriate?    Matt

## 2014-01-18 NOTE — Telephone Encounter (Signed)
Spoke with rep at [facility]  Was advised rx was not covered due to the way rx was written 1-2 every 3-4 hrs prn  He states that this could be up to 12 tabs per day and anything more than 12 per day will not be approved  If we change this to # 30 take 1 every 3 hours as needed this will work  I have printed the rx and will place in [place] to sign   Pt's spouse is aware  I will call her once rx is signed so she can pick up

## 2014-01-18 NOTE — Assessment & Plan Note (Signed)
-   See CT 12/12/13  - PET 12/21/2013  1. Hypermetabolic right upper lobe mass the chest wall is concerning for bronchogenic carcinoma. Recommend tissue sampling. 2. Hypermetabolic mild nodular thickening in the left upper lobe is less specific and may be inflammatory. Recommend attention on follow-up. 3. No hypermetabolic mediastinal lymph nodes. 4. Asymmetric hypermetabolic activity in the deep right pelvis adjacent to the sacrum with differential including local recurrence versus potential deep pelvic infection or physiologic activity. Two additional foci of activity in the pelvis could represent local metastasis or physiologic activity -  12/27/13 bx >  Adenocarcinoma, lung primary  > refer to [facility]> RT scheduled to begin week of 01/29/14    Pt debilitated by pain despite max tramadol > rec oxycontin 20 mg bid and prn oxyir Will contact Dr Lisbeth Renshaw to see if any way to expedite initiation of rx but let him take over all analgesics once rx initiated

## 2014-01-19 NOTE — Telephone Encounter (Signed)
Rx was singed and up front for pick up  Spoke with pt's spouse and notified

## 2014-01-22 ENCOUNTER — Ambulatory Visit
Admission: RE | Admit: 2014-01-22 | Discharge: 2014-01-22 | Disposition: A | Discharge status: Home / Self Care | Payer: Medicare Other | Source: Ambulatory Visit | Attending: Radiation Oncology | Admitting: Radiation Oncology

## 2014-01-22 ENCOUNTER — Non-Acute Institutional Stay (HOSPITAL_COMMUNITY)
Admission: AD | Admit: 2014-01-22 | Discharge: 2014-01-22 | Disposition: A | Discharge status: Home / Self Care | Payer: Medicare Other | Source: Ambulatory Visit | Attending: Radiation Oncology | Admitting: Radiation Oncology

## 2014-01-22 ENCOUNTER — Encounter: Payer: Self-pay | Admitting: Radiation Oncology

## 2014-01-22 VITALS — BP 106/63 | HR 95 | Temp 98.0°F | Resp 18 | Ht 68.0 in | Wt 125.0 lb

## 2014-01-22 DIAGNOSIS — M533 Sacrococcygeal disorders, not elsewhere classified: Secondary | ICD-10-CM | POA: Insufficient documentation

## 2014-01-22 DIAGNOSIS — C3490 Malignant neoplasm of unspecified part of unspecified bronchus or lung: Secondary | ICD-10-CM | POA: Insufficient documentation

## 2014-01-22 DIAGNOSIS — E861 Hypovolemia: Secondary | ICD-10-CM | POA: Insufficient documentation

## 2014-01-22 DIAGNOSIS — E860 Dehydration: Secondary | ICD-10-CM

## 2014-01-22 DIAGNOSIS — R918 Other nonspecific abnormal finding of lung field: Secondary | ICD-10-CM

## 2014-01-22 DIAGNOSIS — M25559 Pain in unspecified hip: Secondary | ICD-10-CM | POA: Insufficient documentation

## 2014-01-22 DIAGNOSIS — Z510 Encounter for antineoplastic radiation therapy: Secondary | ICD-10-CM | POA: Insufficient documentation

## 2014-01-22 DIAGNOSIS — N390 Urinary tract infection, site not specified: Secondary | ICD-10-CM | POA: Insufficient documentation

## 2014-01-22 DIAGNOSIS — C3410 Malignant neoplasm of upper lobe, unspecified bronchus or lung: Secondary | ICD-10-CM | POA: Insufficient documentation

## 2014-01-22 MED ORDER — DEXTROSE-NACL 5-0.9 % IV SOLN
INTRAVENOUS | Status: AC
  Administered 2014-01-22: 10:00:00 via INTRAVENOUS

## 2014-01-22 MED ORDER — PROCHLORPERAZINE MALEATE 10 MG PO TABS: 10.0000 mg | ORAL_TABLET | Freq: Four times a day (QID) | ORAL | Status: DC | PRN

## 2014-01-22 NOTE — Progress Notes (Addendum)
[facility]  Procedure Note  Randy Gould BPP:[id number] DOB: Apr 22, 1941 DOA: 01/22/2014   PCP: [name], MD   Associated Diagnosis: Dehydration  Procedure Note: Patient received hydration D5NS 526ml/hr for 2hrs.    Condition During Procedure: Patient tolerated well with no complications.    Condition at Discharge: Patient tolerated well. No complaints, patient in no apparent distress. Patient left day hospital ambulatory with belongings escorted by spouse.    Wendie Simmer, RN  [facility]

## 2014-01-22 NOTE — Discharge Instructions (Signed)
Dehydration, Adult Dehydration is when you lose more fluids from the body than you take in. Vital organs like the kidneys, brain, and heart cannot function without a proper amount of fluids and salt. Any loss of fluids from the body can cause dehydration.  CAUSES   Vomiting.  Diarrhea.  Excessive sweating.  Excessive urine output.  Fever. SYMPTOMS  Mild dehydration  Thirst.  Dry lips.  Slightly dry mouth. Moderate dehydration  Very dry mouth.  Sunken eyes.  Skin does not bounce back quickly when lightly pinched and released.  Dark urine and decreased urine production.  Decreased tear production.  Headache. Severe dehydration  Very dry mouth.  Extreme thirst.  Rapid, weak pulse (more than 100 beats per minute at rest).  Cold hands and feet.  Not able to sweat in spite of heat and temperature.  Rapid breathing.  Blue lips.  Confusion and lethargy.  Difficulty being awakened.  Minimal urine production.  No tears. DIAGNOSIS  Your caregiver will diagnose dehydration based on your symptoms and your exam. Blood and urine tests will help confirm the diagnosis. The diagnostic evaluation should also identify the cause of dehydration. TREATMENT  Treatment of mild or moderate dehydration can often be done at home by increasing the amount of fluids that you drink. It is best to drink small amounts of fluid more often. Drinking too much at one time can make vomiting worse. Refer to the home care instructions below. Severe dehydration needs to be treated at the hospital where you will probably be given intravenous (IV) fluids that contain water and electrolytes. HOME CARE INSTRUCTIONS   Ask your caregiver about specific rehydration instructions.  Drink enough fluids to keep your urine clear or pale yellow.  Drink small amounts frequently if you have nausea and vomiting.  Eat as you normally do.  Avoid:  Foods or drinks high in sugar.  Carbonated  drinks.  Juice.  Extremely hot or cold fluids.  Drinks with caffeine.  Fatty, greasy foods.  Alcohol.  Tobacco.  Overeating.  Gelatin desserts.  Wash your hands well to avoid spreading bacteria and viruses.  Only take over-the-counter or prescription medicines for pain, discomfort, or fever as directed by your caregiver.  Ask your caregiver if you should continue all prescribed and over-the-counter medicines.  Keep all follow-up appointments with your caregiver. SEEK MEDICAL CARE IF:  You have abdominal pain and it increases or stays in one area (localizes).  You have a rash, stiff neck, or severe headache.  You are irritable, sleepy, or difficult to awaken.  You are weak, dizzy, or extremely thirsty. SEEK IMMEDIATE MEDICAL CARE IF:   You are unable to keep fluids down or you get worse despite treatment.  You have frequent episodes of vomiting or diarrhea.  You have blood or green matter (bile) in your vomit.  You have blood in your stool or your stool looks black and tarry.  You have not urinated in 6 to 8 hours, or you have only urinated a small amount of very dark urine.  You have a fever.  You faint. MAKE SURE YOU:   Understand these instructions.  Will watch your condition.  Will get help right away if you are not doing well or get worse. Document Released: 11/23/2005 Document Revised: 02/15/2012 Document Reviewed: 07/13/2011 Exit[employer] Patient Information 2014 [employer].  

## 2014-01-22 NOTE — H&P (Signed)
   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 Identity was confirmed. All relevant records and images related to the planned course of therapy were reviewed. The patient freely provided informed written consent to proceed with treatment after reviewing the details related to the planned course of therapy. The consent form was witnessed and verified by the simulation staff. Then, the patient was set-up in a stable reproducible supine position for radiation therapy. CT images were obtained. Surface markings were placed. The CT images were loaded into the planning software. Then the target and avoidance structures were contoured. Treatment planning then occurred. The radiation prescription was entered and confirmed. Then, I designed and supervised the construction of a total of 5 medically necessary complex treatment devices in the form of 4 MLCs and one body positioner. I have requested : 3D Simulation I have requested a DVH of the following structures: heart, lungs, spinal cord, and target.  He was hypovolemic today, so, he received Compazine Rx and 1 liter D5 NS.  PLAN: The patient will receive 45 Gy in 25 fractions pre-operatively.  ________________________________   Sheral Apley [name], M.D.

## 2014-01-22 NOTE — Progress Notes (Signed)
Persistent productive cough with gray sputum. Denies hemoptysis. Denies shortness of breath. Wife reports decreased appetite related to nausea. Reports persistent nausea. Reports he vomited this morning. Reports right upper chest pain 5 on a scale of 0-10 "where tumor is." Patient is not orthostatic but, does report that he has lost ten pounds in 30 days. Encouraged patient to push fluid such as Gatorade.

## 2014-01-22 NOTE — Progress Notes (Signed)
  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  Identity was confirmed.  All relevant records and images related to the planned course of therapy were reviewed.  The patient freely provided informed written consent to proceed with treatment after reviewing the details related to the planned course of therapy. The consent form was witnessed and verified by the simulation staff.  Then, the patient was set-up in a stable reproducible  supine position for radiation therapy.  CT images were obtained.  Surface markings were placed.  The CT images were loaded into the planning software.  Then the target and avoidance structures were contoured.  Treatment planning then occurred.  The radiation prescription was entered and confirmed.  Then, I designed and supervised the construction of a total of 5 medically necessary complex treatment devices in the form of 4 MLCs and one body positioner.  I have requested : 3D Simulation  I have requested a DVH of the following structures: heart, lungs, spinal cord, and target.    He was hypovolemic today, so, he received Compazine Rx and 1 liter D5 NS.  PLAN:  The patient will receive 45 Gy in 25 fractions pre-operatively.  ________________________________  Sheral Apley [name], M.D.

## 2014-01-29 ENCOUNTER — Other Ambulatory Visit: Payer: Self-pay | Admitting: Radiation Oncology

## 2014-01-29 ENCOUNTER — Ambulatory Visit: Payer: Medicare Other | Admitting: Radiation Oncology

## 2014-01-29 ENCOUNTER — Ambulatory Visit: Payer: Medicare Other

## 2014-01-29 DIAGNOSIS — R918 Other nonspecific abnormal finding of lung field: Secondary | ICD-10-CM

## 2014-01-29 DIAGNOSIS — C3490 Malignant neoplasm of unspecified part of unspecified bronchus or lung: Secondary | ICD-10-CM

## 2014-01-29 MED ORDER — OXYCODONE HCL 5 MG PO TABS: 5.0000 mg | ORAL_TABLET | Freq: Four times a day (QID) | ORAL | Status: DC | PRN

## 2014-01-30 ENCOUNTER — Telehealth: Payer: Self-pay | Admitting: Radiation Oncology

## 2014-01-30 ENCOUNTER — Encounter: Payer: Self-pay | Admitting: Radiation Oncology

## 2014-01-30 ENCOUNTER — Ambulatory Visit
Admission: RE | Admit: 2014-01-30 | Discharge: 2014-01-30 | Disposition: A | Discharge status: Home / Self Care | Payer: Medicare Other | Source: Ambulatory Visit | Attending: Radiation Oncology | Admitting: Radiation Oncology

## 2014-01-30 NOTE — Telephone Encounter (Signed)
Phoned patient. Spoke with his wife. She confirmed she received a refill script from Dr. [name] for her husband's oxy IR. Despite the weather her and her husband plan to present for his initial treatment.

## 2014-01-30 NOTE — Progress Notes (Signed)
Simulation Verification Note Chest  The patient was brought to the treatment unit and placed in the planned treatment position. The clinical setup was verified. Then port films were obtained and uploaded to the radiation oncology medical record software.  The treatment beams were carefully compared against the planned radiation fields. The position location and shape of the radiation fields was reviewed. They targeted volume of tissue appears to be appropriately covered by the radiation beams. Organs at risk appear to be excluded as planned.  Based on my personal review, I approved the simulation verification. The patient's treatment will proceed as planned.  -----------------------------------  Eppie Gibson, MD

## 2014-01-31 ENCOUNTER — Ambulatory Visit
Admission: RE | Admit: 2014-01-31 | Discharge: 2014-01-31 | Disposition: A | Discharge status: Home / Self Care | Payer: Medicare Other | Source: Ambulatory Visit | Attending: Radiation Oncology | Admitting: Radiation Oncology

## 2014-02-01 ENCOUNTER — Emergency Department (HOSPITAL_COMMUNITY)
Admission: EM | Admit: 2014-02-01 | Discharge: 2014-02-01 | Disposition: A | Discharge status: Home / Self Care | Payer: Medicare Other | Attending: Emergency Medicine | Admitting: Emergency Medicine

## 2014-02-01 ENCOUNTER — Encounter (HOSPITAL_COMMUNITY): Payer: Self-pay | Admitting: Emergency Medicine

## 2014-02-01 ENCOUNTER — Ambulatory Visit: Payer: Medicare Other

## 2014-02-01 DIAGNOSIS — Z85038 Personal history of other malignant neoplasm of large intestine: Secondary | ICD-10-CM | POA: Insufficient documentation

## 2014-02-01 DIAGNOSIS — Z791 Long term (current) use of non-steroidal anti-inflammatories (NSAID): Secondary | ICD-10-CM | POA: Insufficient documentation

## 2014-02-01 DIAGNOSIS — N390 Urinary tract infection, site not specified: Secondary | ICD-10-CM | POA: Insufficient documentation

## 2014-02-01 DIAGNOSIS — Z87891 Personal history of nicotine dependence: Secondary | ICD-10-CM | POA: Insufficient documentation

## 2014-02-01 DIAGNOSIS — Z79899 Other long term (current) drug therapy: Secondary | ICD-10-CM | POA: Insufficient documentation

## 2014-02-01 DIAGNOSIS — Z85118 Personal history of other malignant neoplasm of bronchus and lung: Secondary | ICD-10-CM | POA: Insufficient documentation

## 2014-02-01 LAB — CBC WITH DIFFERENTIAL/PLATELET
Basophils Absolute: 0.1 10*3/uL (ref 0.0–0.1)
Basophils Relative: 0 % (ref 0–1)
EOS PCT: 0 % (ref 0–5)
Eosinophils Absolute: 0 10*3/uL (ref 0.0–0.7)
HCT: 32 % — ABNORMAL LOW (ref 39.0–52.0)
HEMOGLOBIN: 10.9 g/dL — AB (ref 13.0–17.0)
LYMPHS ABS: 1.5 10*3/uL (ref 0.7–4.0)
Lymphocytes Relative: 10 % — ABNORMAL LOW (ref 12–46)
MCH: 27.5 pg (ref 26.0–34.0)
MCHC: 34.1 g/dL (ref 30.0–36.0)
MCV: 80.6 fL (ref 78.0–100.0)
Monocytes Absolute: 1.1 10*3/uL — ABNORMAL HIGH (ref 0.1–1.0)
Monocytes Relative: 7 % (ref 3–12)
Neutro Abs: 12.8 10*3/uL — ABNORMAL HIGH (ref 1.7–7.7)
Neutrophils Relative %: 83 % — ABNORMAL HIGH (ref 43–77)
PLATELETS: 469 10*3/uL — AB (ref 150–400)
RBC: 3.97 MIL/uL — AB (ref 4.22–5.81)
RDW: 14.4 % (ref 11.5–15.5)
WBC: 15.5 10*3/uL — ABNORMAL HIGH (ref 4.0–10.5)

## 2014-02-01 LAB — URINALYSIS, ROUTINE W REFLEX MICROSCOPIC
Glucose, UA: NEGATIVE mg/dL
Ketones, ur: NEGATIVE mg/dL
NITRITE: POSITIVE — AB
PH: 6 (ref 5.0–8.0)
Protein, ur: >300 mg/dL — AB
SPECIFIC GRAVITY, URINE: 1.025 (ref 1.005–1.030)
UROBILINOGEN UA: 1 mg/dL (ref 0.0–1.0)

## 2014-02-01 LAB — BASIC METABOLIC PANEL
BUN: 14 mg/dL (ref 6–23)
CALCIUM: 9.4 mg/dL (ref 8.4–10.5)
CO2: 25 meq/L (ref 19–32)
Chloride: 95 mEq/L — ABNORMAL LOW (ref 96–112)
Creatinine, Ser: 1 mg/dL (ref 0.50–1.35)
GFR calc Af Amer: 85 mL/min — ABNORMAL LOW (ref >90)
GFR, EST NON AFRICAN AMERICAN: 73 mL/min — AB (ref >90)
GLUCOSE: 97 mg/dL (ref 70–99)
Potassium: 3.9 mEq/L (ref 3.7–5.3)
Sodium: 137 mEq/L (ref 137–147)

## 2014-02-01 LAB — URINE MICROSCOPIC-ADD ON

## 2014-02-01 MED ORDER — ENSURE COMPLETE SHAKE PO LIQD: 1.0000 | Freq: Every day | ORAL | Status: DC | PRN

## 2014-02-01 MED ORDER — CIPROFLOXACIN HCL 500 MG PO TABS: 500.0000 mg | ORAL_TABLET | Freq: Two times a day (BID) | ORAL | Status: DC

## 2014-02-01 MED ORDER — DEXTROSE 5 % IV SOLN
1.0000 g | Freq: Once | INTRAVENOUS | Status: AC
  Administered 2014-02-01: 1 g via INTRAVENOUS
  Filled 2014-02-01: qty 10

## 2014-02-01 MED ORDER — SODIUM CHLORIDE 0.9 % IV BOLUS (SEPSIS)
1000.0000 mL | Freq: Once | INTRAVENOUS | Status: AC
  Administered 2014-02-01: 1000 mL via INTRAVENOUS

## 2014-02-01 NOTE — ED Notes (Addendum)
Pt sts increased frequency, decreased output, and pain w/ urination x 2days.  Pt reports hematuria.

## 2014-02-01 NOTE — Discharge Instructions (Signed)
Urinary Tract Infection  Urinary tract infections (UTIs) can develop anywhere along your urinary tract. Your urinary tract is your body's drainage system for removing wastes and extra water. Your urinary tract includes two kidneys, two ureters, a bladder, and a urethra. Your kidneys are a pair of bean-shaped organs. Each kidney is about the size of your fist. They are located below your ribs, one on each side of your spine.  CAUSES  Infections are caused by microbes, which are microscopic organisms, including fungi, viruses, and bacteria. These organisms are so small that they can only be seen through a microscope. Bacteria are the microbes that most commonly cause UTIs.  SYMPTOMS   Symptoms of UTIs may vary by age and gender of the patient and by the location of the infection. Symptoms in young women typically include a frequent and intense urge to urinate and a painful, burning feeling in the bladder or urethra during urination. Older women and men are more likely to be tired, shaky, and weak and have muscle aches and abdominal pain. A fever may mean the infection is in your kidneys. Other symptoms of a kidney infection include pain in your back or sides below the ribs, nausea, and vomiting.  DIAGNOSIS  To diagnose a UTI, your caregiver will ask you about your symptoms. Your caregiver also will ask to provide a urine sample. The urine sample will be tested for bacteria and white blood cells. White blood cells are made by your body to help fight infection.  TREATMENT   Typically, UTIs can be treated with medication. Because most UTIs are caused by a bacterial infection, they usually can be treated with the use of antibiotics. The choice of antibiotic and length of treatment depend on your symptoms and the type of bacteria causing your infection.  HOME CARE INSTRUCTIONS   If you were prescribed antibiotics, take them exactly as your caregiver instructs you. Finish the medication even if you feel better after you  have only taken some of the medication.   Drink enough water and fluids to keep your urine clear or pale yellow.   Avoid caffeine, tea, and carbonated beverages. They tend to irritate your bladder.   Empty your bladder often. Avoid holding urine for long periods of time.   Empty your bladder before and after sexual intercourse.   After a bowel movement, women should cleanse from front to back. Use each tissue only once.  SEEK MEDICAL CARE IF:    You have back pain.   You develop a fever.   Your symptoms do not begin to resolve within 3 days.  SEEK IMMEDIATE MEDICAL CARE IF:    You have severe back pain or lower abdominal pain.   You develop chills.   You have nausea or vomiting.   You have continued burning or discomfort with urination.  MAKE SURE YOU:    Understand these instructions.   Will watch your condition.   Will get help right away if you are not doing well or get worse.  Document Released: 09/02/2005 Document Revised: 05/24/2012 Document Reviewed: 01/01/2012  Exit[employer] Patient Information 2014 [employer].

## 2014-02-01 NOTE — ED Notes (Addendum)
For 2 days now he has had hematuria and frequency in urine. CA pt and is to have tx today. Denies any abd pain nor back pain. Pt states that he does not make urine a lot usally. Pt has a chemo treatment today at 230.

## 2014-02-01 NOTE — ED Provider Notes (Signed)
CSN: [id number]     Arrival date & time 02/01/14  1245 History   First MD Initiated Contact with Patient 02/01/14 1329     Chief Complaint  Patient presents with  . Urinary Frequency  . Hematuria     (Consider location/radiation/quality/duration/timing/severity/associated sxs/prior Treatment) HPI Comments: The patient is a 73 year old male with history of colon cancer, lung cancer who presents today with with urinary urgency, urinary frequency, and dysuria over the past 3 days. He feels that his symptoms have been gradually worsening over that time. He has been eating and drinking less over the past 3 days due to the severe pain that he feels with urination. It is a burning sensation. No penile pain, discharge, testicular pain. He denies any abdominal pain, nausea, vomiting, diarrhea, fever, chills, shortness of breath, chest pain, cough. He has never had pain like this in the past. He denies any history of UTI. He denies any issues with his prostate in the past. He is scheduled for his 3rd of 7 radiation treatments today.  Patient is a 73 y.o. male presenting with frequency and hematuria. The history is provided by the patient. No language interpreter was used.  Urinary Frequency Pertinent negatives include no abdominal pain, chest pain, chills, fever, nausea or vomiting.  Hematuria Pertinent negatives include no abdominal pain, chest pain, chills, fever, nausea or vomiting.    Past Medical History  Diagnosis Date  . Cancer of colon 2003   Past Surgical History  Procedure Laterality Date  . Colon surgery     Family History  Problem Relation Age of Onset  . Cancer      stomach   History  Substance Use Topics  . Smoking status: Former Smoker -- 1.00 packs/day for 30 years    Types: Cigarettes    Quit date: 01/10/2014  . Smokeless tobacco: Not on file  . Alcohol Use: Yes     Comment: 1-2 drinks per weel    Review of Systems  Constitutional: Negative for fever and chills.   Respiratory: Negative for shortness of breath.   Cardiovascular: Negative for chest pain.  Gastrointestinal: Negative for nausea, vomiting and abdominal pain.  Genitourinary: Positive for dysuria, frequency, hematuria and difficulty urinating.  All other systems reviewed and are negative.      Allergies  Ativan and Other  Home Medications   Current Outpatient Rx  Name  Route  Sig  Dispense  Refill  . acetaminophen (TYLENOL) 650 MG CR tablet   Oral   Take 650 mg by mouth every 8 (eight) hours as needed for pain.         Marland Kitchen esomeprazole (NEXIUM) 20 MG capsule   Oral   Take 20 mg by mouth daily at 12 noon.         . naproxen sodium (ANAPROX) 220 MG tablet   Oral   Take 440 mg by mouth 2 (two) times daily with a meal.         . oxyCODONE (OXY IR/ROXICODONE) 5 MG immediate release tablet   Oral   Take 1-2 tablets (5-10 mg total) by mouth every 6 (six) hours as needed for severe pain. 1 tablet every 3 hours as needed for pain   120 tablet   0   . OxyCODONE (OXYCONTIN) 20 mg T12A 12 hr tablet   Oral   Take 1 tablet (20 mg total) by mouth every 12 (twelve) hours.   60 tablet   0   . prochlorperazine (COMPAZINE) 10 MG tablet  Oral   Take 1 tablet (10 mg total) by mouth every 6 (six) hours as needed for nausea or vomiting.   45 tablet   0   . traMADol (ULTRAM) 50 MG tablet   Oral   Take 50 mg by mouth every 6 (six) hours as needed for moderate pain.           BP 108/62  Pulse 75  Temp(Src) 98.2 F (36.8 C) (Oral)  Resp 16  SpO2 96% Physical Exam  Nursing note and vitals reviewed. Constitutional: He is oriented to person, place, and time. He appears well-developed and well-nourished. No distress.  HENT:  Head: Normocephalic and atraumatic.  Right Ear: External ear normal.  Left Ear: External ear normal.  Nose: Nose normal.  Eyes: Conjunctivae are normal.  Neck: Normal range of motion. No tracheal deviation present.  Cardiovascular: Normal rate,  regular rhythm and normal heart sounds.   Pulmonary/Chest: Effort normal and breath sounds normal. No stridor.  Abdominal: Soft. He exhibits no distension. There is no tenderness.  Colostomy bag in place  Genitourinary: Testes normal. Rectal exam shows tenderness. Rectal exam shows anal tone normal. Right testis shows no mass and no tenderness. Left testis shows no mass and no tenderness. Uncircumcised. No phimosis, paraphimosis, hypospadias, penile erythema or penile tenderness. No discharge found.  Musculoskeletal: Normal range of motion.  Neurological: He is alert and oriented to person, place, and time.  Skin: Skin is warm and dry. He is not diaphoretic.  Psychiatric: He has a normal mood and affect. His behavior is normal.    ED Course  Procedures (including critical care time) Labs Review Labs Reviewed  URINALYSIS, ROUTINE W REFLEX MICROSCOPIC - Abnormal; Notable for the following:    Color, Urine AMBER (*)    APPearance TURBID (*)    Hgb urine dipstick LARGE (*)    Bilirubin Urine SMALL (*)    Protein, ur >300 (*)    Nitrite POSITIVE (*)    Leukocytes, UA LARGE (*)    All other components within normal limits  CBC WITH DIFFERENTIAL - Abnormal; Notable for the following:    WBC 15.5 (*)    RBC 3.97 (*)    Hemoglobin 10.9 (*)    HCT 32.0 (*)    Platelets 469 (*)    Neutrophils Relative % 83 (*)    Neutro Abs 12.8 (*)    Lymphocytes Relative 10 (*)    Monocytes Absolute 1.1 (*)    All other components within normal limits  BASIC METABOLIC PANEL - Abnormal; Notable for the following:    Chloride 95 (*)    GFR calc non Af Amer 73 (*)    GFR calc Af Amer 85 (*)    All other components within normal limits  URINE CULTURE  URINE MICROSCOPIC-ADD ON   Imaging Review No results found.  EKG Interpretation   None       MDM   Final diagnoses:  UTI (lower urinary tract infection)   Pt has been diagnosed with a UTI. Pt is afebrile, no CVA tenderness, normotensive, and  denies N/V. Pt to be dc home with antibiotics. He was given a dose of IV rocephin in the ED. Discharged with Cipro due to pain with rectal exam. Follow up with PCP. Strict return instructions given. Vital signs stable for discharge. Dr. Regenia Skeeter evaluated patient and agrees with plan. Patient / Family / Caregiver informed of clinical course, understand medical decision-making process, and agree with plan.  Elwyn Lade, PA-C 02/01/14 1624

## 2014-02-02 ENCOUNTER — Ambulatory Visit
Admission: RE | Admit: 2014-02-02 | Discharge: 2014-02-02 | Disposition: A | Discharge status: Home / Self Care | Payer: Medicare Other | Source: Ambulatory Visit | Attending: Radiation Oncology | Admitting: Radiation Oncology

## 2014-02-02 ENCOUNTER — Encounter: Payer: Self-pay | Admitting: Radiation Oncology

## 2014-02-02 VITALS — BP 116/69 | HR 70 | Resp 16 | Wt 119.3 lb

## 2014-02-02 DIAGNOSIS — R918 Other nonspecific abnormal finding of lung field: Secondary | ICD-10-CM

## 2014-02-02 MED ORDER — RADIAPLEXRX EX GEL
Freq: Once | CUTANEOUS | Status: AC
  Administered 2014-02-02: 16:00:00 via TOPICAL

## 2014-02-02 NOTE — Progress Notes (Addendum)
Reports he went to the emergency room yesterday for intense burning with urination. Reports he was told by Dr. Epimenio Foot he has "a bad urinary tract infection." Patient was prescribed cipro 500 mg bid. Reports less burning with urination today. Reports an occasional cough with clear sputum. Denies shortness of breath. Denies skin changes within treatment field.   Oriented patient to staff and routine of the clinic. Provided patient with Radiation Therapy and You handbook then, reviewed pertinent information. Education patient reference potential side effects and management such as, fatigue, skin changes, increasing cough and shortness of breath. Provided patient with radiaplex gel and directed upon use. Patient verbalized understanding of all reviewed.

## 2014-02-03 ENCOUNTER — Encounter: Payer: Self-pay | Admitting: Radiation Oncology

## 2014-02-03 LAB — URINE CULTURE: Colony Count: >=100000

## 2014-02-03 NOTE — Progress Notes (Signed)
  Radiation Oncology         [phone]) [phone] ________________________________  Name: Randy Gould MRN: [id number]  Date: 02/02/2014  DOB: 07/04/1941  Weekly Radiation Therapy Management  T3 N0 M0 Adenocarcinoma of the Left Upper Lung Pre-Op Chemoradiotherapy to 45 Gy   Current Dose: 5.4 Gy     Planned Dose:  45 Gy  Narrative . . . . . . . . The patient presents for routine under treatment assessment.                                   Reports he went to the emergency room yesterday for intense burning with urination. Reports he was told by Dr. Epimenio Foot he has "a bad urinary tract infection." Patient was prescribed cipro 500 mg bid. Reports less burning with urination today. Reports an occasional cough with clear sputum. Denies shortness of breath. Denies skin changes within treatment field.  Oriented patient to staff and routine of the clinic. Provided patient with Radiation Therapy and You handbook then, reviewed pertinent information. Education patient reference potential side effects and management such as, fatigue, skin changes, increasing cough and shortness of breath. Provided patient with radiaplex gel and directed upon use. Patient verbalized understanding of all reviewed            The patient is without complaint.                                 Set-up films were reviewed.                                 The chart was checked. Physical Findings. . .  weight is 119 lb 4.8 oz (54.114 kg). His blood pressure is 116/69 and his pulse is 70. His respiration is 16 and oxygen saturation is 100%. . Weight essentially stable.  No significant changes. Impression . . . . . . . The patient is tolerating radiation. Plan . . . . . . . . . . . . Continue treatment as planned.  ________________________________  Sheral Apley. [name], M.D.

## 2014-02-03 NOTE — Assessment & Plan Note (Signed)
Pre-Op Chemoradiotherapy to 45 Gy

## 2014-02-03 NOTE — ED Provider Notes (Signed)
Medical screening examination/treatment/procedure(s) were conducted as a shared visit with non-physician practitioner(s) and myself.  I personally evaluated the patient during the encounter.   EKG Interpretation None      patient well appearing here, no vomiting, fevers or abd pain. UTI with WBC of 15, at this time after discussion with family I feel he can be tried on outpatient abx with close f/u.  Ephraim Hamburger, MD 02/03/14 [phone]

## 2014-02-04 ENCOUNTER — Telehealth (HOSPITAL_COMMUNITY): Payer: Self-pay | Admitting: Emergency Medicine

## 2014-02-04 NOTE — ED Notes (Signed)
Post ED Visit - Positive Culture Follow-up  Culture report reviewed by antimicrobial stewardship pharmacist: []  Wes Nectar, Pharm.D., BCPS []  Heide Guile, Pharm.D., BCPS []  Alycia Rossetti, Pharm.D., BCPS []  [place], [place].D., BCPS, AAHIVP []  Legrand Como, Pharm.D., BCPS, AAHIVP [x]  Salome Arnt, Pharm.D., BCPS  Positive urine culture Treated with Cipro, organism sensitive to the same and no further patient follow-up is required at this time.  Myrna Blazer 02/04/2014, 3:37 PM

## 2014-02-05 ENCOUNTER — Ambulatory Visit
Admission: RE | Admit: 2014-02-05 | Discharge: 2014-02-05 | Disposition: A | Discharge status: Home / Self Care | Payer: Medicare Other | Source: Ambulatory Visit | Attending: Radiation Oncology | Admitting: Radiation Oncology

## 2014-02-06 ENCOUNTER — Ambulatory Visit
Admission: RE | Admit: 2014-02-06 | Discharge: 2014-02-06 | Disposition: A | Discharge status: Home / Self Care | Payer: Medicare Other | Source: Ambulatory Visit | Attending: Radiation Oncology | Admitting: Radiation Oncology

## 2014-02-07 ENCOUNTER — Ambulatory Visit
Admission: RE | Admit: 2014-02-07 | Discharge: 2014-02-07 | Disposition: A | Discharge status: Home / Self Care | Payer: Medicare Other | Source: Ambulatory Visit | Attending: Radiation Oncology | Admitting: Radiation Oncology

## 2014-02-08 ENCOUNTER — Encounter: Payer: Self-pay | Admitting: Radiation Oncology

## 2014-02-08 ENCOUNTER — Ambulatory Visit
Admission: RE | Admit: 2014-02-08 | Discharge: 2014-02-08 | Disposition: A | Discharge status: Home / Self Care | Payer: Medicare Other | Source: Ambulatory Visit | Attending: Radiation Oncology | Admitting: Radiation Oncology

## 2014-02-08 VITALS — BP 118/67 | HR 80 | Resp 16 | Wt 122.2 lb

## 2014-02-08 DIAGNOSIS — C3410 Malignant neoplasm of upper lobe, unspecified bronchus or lung: Secondary | ICD-10-CM

## 2014-02-08 NOTE — Progress Notes (Signed)
Scheduled to complete cipro for UTI on Monday. Reports dysuria has resolved. Denies cough or shortness of breath. Reports manageable fatigue. Reports right upper chest and right shoulder pain that increases when he raises his right arm above his head. He reports that oxycodone seems to help more than oxycontin to relieve this pain. Discussed the difference between long and short acting medications then, encouraged him to continues pain regimen as directed by physician. Denies skin changes to right upper chest or back within treatment field. Reports using radiaplex gel as directed.

## 2014-02-08 NOTE — Progress Notes (Signed)
  Radiation Oncology         [phone]) [phone] ________________________________  Name: Randy Gould MRN: [id number]  Date: 02/08/2014  DOB: 1941/04/26  Weekly Radiation Therapy Management  Current Dose: 12.6 Gy     Planned Dose:  45 Gy  Narrative . . . . . . . . The patient presents for routine under treatment assessment.                                   The patient is without complaint.                                 Set-up films were reviewed.                                 The chart was checked. Physical Findings. . .  weight is 122 lb 3.2 oz (55.43 kg). His blood pressure is 118/67 and his pulse is 80. His respiration is 16 and oxygen saturation is 99%. . Weight essentially stable.  No significant changes. Impression . . . . . . . The patient is tolerating radiation. Plan . . . . . . . . . . . . Continue treatment as planned.  ________________________________  Sheral Apley. [name], M.D.

## 2014-02-09 ENCOUNTER — Ambulatory Visit
Admission: RE | Admit: 2014-02-09 | Discharge: 2014-02-09 | Disposition: A | Discharge status: Home / Self Care | Payer: Medicare Other | Source: Ambulatory Visit | Attending: Radiation Oncology | Admitting: Radiation Oncology

## 2014-02-12 ENCOUNTER — Ambulatory Visit
Admission: RE | Admit: 2014-02-12 | Discharge: 2014-02-12 | Disposition: A | Discharge status: Home / Self Care | Payer: Medicare Other | Source: Ambulatory Visit | Attending: Radiation Oncology | Admitting: Radiation Oncology

## 2014-02-13 ENCOUNTER — Ambulatory Visit: Admission: RE | Admit: 2014-02-13 | Payer: Medicare Other | Source: Ambulatory Visit

## 2014-02-14 ENCOUNTER — Ambulatory Visit
Admission: RE | Admit: 2014-02-14 | Discharge: 2014-02-14 | Disposition: A | Discharge status: Home / Self Care | Payer: Medicare Other | Source: Ambulatory Visit | Attending: Radiation Oncology | Admitting: Radiation Oncology

## 2014-02-15 ENCOUNTER — Ambulatory Visit
Admission: RE | Admit: 2014-02-15 | Discharge: 2014-02-15 | Disposition: A | Discharge status: Home / Self Care | Payer: Medicare Other | Source: Ambulatory Visit | Attending: Radiation Oncology | Admitting: Radiation Oncology

## 2014-02-15 ENCOUNTER — Ambulatory Visit (INDEPENDENT_AMBULATORY_CARE_PROVIDER_SITE_OTHER): Payer: Medicare Other | Admitting: Cardiothoracic Surgery

## 2014-02-15 ENCOUNTER — Encounter: Payer: Self-pay | Admitting: Cardiothoracic Surgery

## 2014-02-15 VITALS — BP 113/65 | HR 85 | Resp 18 | Ht 68.0 in | Wt 122.0 lb

## 2014-02-15 DIAGNOSIS — R071 Chest pain on breathing: Secondary | ICD-10-CM

## 2014-02-15 DIAGNOSIS — C3410 Malignant neoplasm of upper lobe, unspecified bronchus or lung: Secondary | ICD-10-CM

## 2014-02-15 DIAGNOSIS — R0789 Other chest pain: Secondary | ICD-10-CM

## 2014-02-15 NOTE — Progress Notes (Signed)
[address]       [name], [address]             [phone]                      Randy Gould [name] #[id number] Date of Birth: February 02, 1941  Referring: Dr [name]  Primary Care: [name], MD  Chief Complaint:   Right chest wall pain   History of Present Illness:    Randy Gould 73 y.o. male is seen in the office for rt chest wall pain with chest wall and lung mass. The patient was treated for stage T3 N1 rectal cancer at age 75 with LAR followed by chemoradiotherapy complicated by anastomosis breakdown requiring revision multiple surgeries as recently as 2005. This ultimately required colostomy for diversion. More recently, he presented with chest pain on right side. Chest X-Ray and subsequent CT angio showed a 5.7 cm right  upper lobe peripheral mass with likely chest wall invasion. PET scan confirmed hypermetabolisim there without mediastinal lymphadenopathy suggesting T3 N0 disease, with some hypermetabolic pelvic nodes of uncertain significance. CT biopsy of the lung mass shows adenocarcinoma. ALK & EGFR  Neagtive.  There is a small focus of hypermetabolic nodular pleural thickening in the left upper lobe measuring 7 mm (image 60) with mild metabolic activity (SUV max 2.6).  He was referred to [facility] . Since that visit the patient has had MRI of the brain and full set of pulmonary function studies to evaluate for possible resection.  Patient has remained smoke free for the last 10 days..  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   The patient has completed 7 radiation treatments. He notes some improvement in chest wall pain, but still needs pain meds when getting radiation due to positioning He remains off tobacco since last seen      Current Activity/ Functional Status:  Patient is independent with mobility/ambulation, transfers, ADL's, IADL's.   Zubrod Score: At the time of surgery this patient's most  appropriate activity status/level should be described as: []     0    Normal activity, no symptoms [x]     1    Restricted in physical strenuous activity but ambulatory, able to do out light work []     2    Ambulatory and capable of self care, unable to do work activities, up and about               >50 % of waking hours                              []     3    Only limited self care, in bed greater than 50% of waking hours []     4    Completely disabled, no self care, confined to bed or chair []     5    Moribund   Past Medical History  Diagnosis Date  . Cancer of colon 2003    Past Surgical History  Procedure Laterality Date  . Colon surgery      Family History  Problem Relation Age of Onset  . Cancer      stomach    History   Social History  . Marital Status: Married    Spouse Name: N/A    Number of Children: N/A  . Years of Education: N/A  Occupational History  . Not on file.   Social History Main Topics  . Smoking status: Current Every Day Smoker -- 1.00 packs/day for 30 years    Types: Cigarettes  . Smokeless tobacco: Not on file  . Alcohol Use: Yes     Comment: 1-2 drinks per weel  . Drug Use: No  . Sexual Activity: Not on file           History  Smoking status  . Former Smoker -- 1.00 packs/day for 30 years  . Types: Cigarettes  . Quit date: 01/10/2014  Smokeless tobacco  . Not on file    History  Alcohol Use  . Yes    Comment: 1-2 drinks per weel     Allergies  Allergen Reactions  . Ativan [Lorazepam] Other (See Comments)    REACTION: Increase heart rate  . Other Itching and Rash    States "head to toe" itching from a "powerful antibiotic" administered years ago.  Cannot recall name of antibiotic.    Current Outpatient Prescriptions  Medication Sig Dispense Refill  . acetaminophen (TYLENOL) 650 MG CR tablet Take 650 mg by mouth every 8 (eight) hours as needed for pain.      Marland Kitchen amoxicillin-clavulanate (AUGMENTIN) 875-125 MG per tablet        . ciprofloxacin (CIPRO) 500 MG tablet Take 1 tablet (500 mg total) by mouth 2 (two) times daily. One po bid x 7 days  14 tablet  0  . esomeprazole (NEXIUM) 20 MG capsule Take 20 mg by mouth daily at 12 noon.      . naproxen sodium (ANAPROX) 220 MG tablet Take 440 mg by mouth 2 (two) times daily with a meal.      . Nutritional Supplements (ENSURE COMPLETE SHAKE) LIQD Take 1 Bottle by mouth daily as needed.  6 Bottle  1  . oxyCODONE (OXY IR/ROXICODONE) 5 MG immediate release tablet Take 1-2 tablets (5-10 mg total) by mouth every 6 (six) hours as needed for severe pain. 1 tablet every 3 hours as needed for pain  120 tablet  0  . OxyCODONE (OXYCONTIN) 20 mg T12A 12 hr tablet Take 1 tablet (20 mg total) by mouth every 12 (twelve) hours.  60 tablet  0  . prochlorperazine (COMPAZINE) 10 MG tablet Take 1 tablet (10 mg total) by mouth every 6 (six) hours as needed for nausea or vomiting.  45 tablet  0  . traMADol (ULTRAM) 50 MG tablet Take 50 mg by mouth every 6 (six) hours as needed for moderate pain.        No current facility-administered medications for this visit.     Review of Systems:     Cardiac Review of Systems: Y or N  Chest Pain [  n  ]  Resting SOB [ n  ] Exertional SOB  Blue.Reese  ]  Orthopnea [ y ]   Pedal Edema [  n ]    Palpitations [ n ] Syncope  [n  ]   Presyncope [n   ]  General Review of Systems: [Y] = yes [  ]=no Constitional: recent weight change [ n ];  Wt loss over the last 3 months [   ] anorexia [n  ]; fatigue [ y ]; nausea [n  ]; night sweats [n  ]; fever [  ]; or chillsn [  ];          Dental: poor dentition[  n]; Last Dentist visit:   Eye : blurred  vision [ n ]; diplopia [   ]; vision changes [n  ];  Amaurosis fugax[  ]; Resp: cough [  ];  wheezing[ y ];  hemoptysis[ y ]; shortness of breath[y  ]; paroxysmal nocturnal dyspnea[y  ]; dyspnea on exertion[  y]; or orthopnea[  ];  GI:  gallstones[  ], vomiting[  ];  dysphagia[  ]; melena[  ];  hematochezia [  ]; heartburn[  ];   Hx  of  Colonoscopy[  ]; GU: kidney stones [n  ]; hematuria[ n ];   dysuria [  ];  nocturia[n  ];  history of     obstruction [  ]; urinary frequency [  ]             Skin: rash, swelling[  ];, hair loss[  ];  peripheral edema[  ];  or itching[  ]; Musculosketetal: myalgias[  ];  joint swelling[  ];  joint erythema[  ];  joint pain[  ];  back pain[  ];  Heme/Lymph: bruising[  ];  bleeding[  ];  anemia[  ];  Neuro: TIA[ n ];  headaches[ n ];  stroke[  ];  vertigo[  ];  seizures[ n ];   paresthesias[  ];  difficulty walking[  ];  Psych:depression[  ]; anxiety[  ];  Endocrine: diabetes[  ];  thyroid dysfunction[  ];  Immunizations: Flu up to date [  n]; Pneumococcal up to date [  n];  Other:  Physical Exam: There were no vitals taken for this visit.  PHYSICAL EXAMINATION:  General appearance: alert, cooperative, appears older than stated age, cachectic and no distress Neurologic: intact Heart: regular rate and rhythm, S1, S2 normal, no murmur, click, rub or gallop Lungs: diminished breath sounds bilaterally Abdomen: soft, non-tender; bowel sounds normal; no masses,  no organomegaly Extremities: extremities normal, atraumatic, no cyanosis or edema and Homans sign is negative, no sign of DVT no cervical or axillary adenopthy   Diagnostic Studies & Laboratory data:     Recent Radiology Findings:  Mr Kizzie Fantasia Contrast  01/09/2014   CLINICAL DATA:  Lung cancer.  Remote history of colon cancer.  EXAM: MRI HEAD WITHOUT AND WITH CONTRAST  TECHNIQUE: Multiplanar, multiecho pulse sequences of the brain and surrounding structures were obtained without and with intravenous contrast.  CONTRAST:  68m MULTIHANCE GADOBENATE DIMEGLUMINE 529 MG/ML IV SOLN  COMPARISON:  CT scan 12/21/2013.  FINDINGS: No acute infarct, hemorrhage, or mass lesion is present. Postcontrast images demonstrate no pathologic enhancement to suggest metastatic disease of the brain or meninges. Midline structures are within normal  limits.  Flow is present in the major intracranial arteries. The left lens has been replaced. The globes and orbits are otherwise intact. The a lateral recess of the right sphenoid sinus is opacified. The paranasal sinuses and mastoid air cells are otherwise clear.  IMPRESSION: 1. No evidence for metastatic disease the brain or meninges. 2. MRI appearance of the brain for age 89 Opacification of the lateral recess of the right sphenoid sinus.   Electronically Signed   By: CLawrence SantiagoM.D.   On: 01/09/2014 14:42     Ct Angio Chest Pe W/cm &/or Wo Cm  12/12/2013   CLINICAL DATA:  Right upper chest burning sensation or 3 weeks question pulmonary embolism, history colon cancer  EXAM: CT ANGIOGRAPHY CHEST WITH CONTRAST  TECHNIQUE: Multidetector CT imaging of the chest was performed using the standard protocol during bolus administration of intravenous contrast. Multiplanar CT image  reconstructions including MIPs were obtained to evaluate the vascular anatomy.  CONTRAST:  71m OMNIPAQUE IOHEXOL 350 MG/ML SOLN  COMPARISON:  CT chest with contrast 03/05/2011  FINDINGS: Atherosclerotic calcifications aorta without aneurysm or dissection.  Pulmonary arteries patent.  No evidence of pulmonary embolism.  No thoracic adenopathy.  Nonspecific 6 mm low-attenuation focus last segment left lobe liver image 98 unchanged.  Remaining visualized portions of upper abdomen normal appearance.  Peripheral mass identified at the lateral aspect of the right upper hemithorax, contiguous with the pleural surface over a broad base, more confluent and mass-like in appearance versus the previous exam.  Finding is worrisome for a Pancoast tumor measuring 5.7 x 2.7 x 4.6 cm  Suspected associated bone destruction of the lateral aspect of the right 3rd rib.  Underlying COPD changes with calcified granuloma at the right upper lobe.  Peripheral areas of scarring in both lungs predominantly upper lobes.  Subsegmental atelectasis right lower lobe.   Remaining lungs clear.  No pleural effusion or pneumothorax.  No additional osseous findings.  Review of the MIP images confirms the above findings.  IMPRESSION: No evidence of pulmonary embolism.  Peripheral tumor with broad pleural base at the lateral aspect of the right upper lobe 5.7 x 2.7 x 4.6 cm in size worrisome for a Pancoast tumor; recommend tissue diagnosis with PET-CT imaging. .  Associated bone destruction of the lateral aspect of the right 3rd rib is suspected.  Severe underlying COPD and old granulomatous disease.   Electronically Signed   By: MLavonia DanaM.D.   On: 12/12/2013 15:25   Nm Pet Image Initial (pi) Skull Base To Thigh  12/21/2013   CLINICAL DATA:  Initial treatment strategy for lung mass. Prior history of colon cancer.  EXAM: NUCLEAR MEDICINE PET SKULL BASE TO THIGH  FASTING BLOOD GLUCOSE:  Value: 9106mdl  TECHNIQUE: 19.5 mCi F-18 FDG was injected intravenously. CT data was obtained and used for attenuation correction and anatomic localization only. (This was not acquired as a diagnostic CT examination.) Additional exam technical data entered on technologist worksheet.  COMPARISON:  CT ANGIO CHEST W/CM &/OR WO/CM dated 12/12/2013; DG CHEST 2 VIEW dated 12/08/2013; CT CHEST W/CM dated 03/05/2011  FINDINGS: NECK  No hypermetabolic lymph nodes in the neck.  CHEST  There is a hypermetabolic mass in the lateral right upper lobe measuring 5.1 x 2.7 cm with intense metabolic activity (SUV max 16.8). This mass shares a broad surface with the chest wall and appears to involve adjacent ribs. There is a small focus of hypermetabolic nodular pleural thickening in the left upper lobe measuring 7 mm (image 60) with mild metabolic activity (SUV max 2.6). There is extensive centrilobular emphysema with upper lobe predominance. No hypermetabolic mediastinal lymph nodes.  ABDOMEN/PELVIS  There is asymmetric hypermetabolic activity associated with the lower rectal region at site of prior surgical resection and  surgical clips. Difficult to define the tissue planes at this level due to CT technique and lack of contrast. Hypermetabolic activity is seen on CT image 206 just anterior to the sacrum on the right. Second nodular focus of hypermetabolic activity in the deep pelvis just right of midline on image 194 without clear definable lesion on the CT portion. A thirdfocus of activity is noted medial to the left psoas muscle (image 183. This could represent ureteral activity. There is physiologic activity noted in the left lower quadrant colostomy. No abnormal metabolic activity within the liver or adrenal glands.  SKELETON  No focal hypermetabolic activity  to suggest skeletal metastasis.  IMPRESSION: 1. Hypermetabolic right upper lobe mass the chest wall is concerning for bronchogenic carcinoma. Recommend tissue sampling. 2. Hypermetabolic mild nodular thickening in the left upper lobe is less specific and may be inflammatory. Recommend attention on follow-up. 3. No hypermetabolic mediastinal lymph nodes. 4. Asymmetric hypermetabolic activity in the deep right pelvis adjacent to the sacrum with differential including local recurrence versus potential deep pelvic infection or physiologic activity. Two additional foci of activity in the pelvis could represent local metastasis or physiologic activity. Recommend correlation with CEA and if elevated recommend contrast-enhanced CT of the pelvis for further evaluation.   Electronically Signed   By: Suzy Bouchard M.D.   On: 12/21/2013 16:20   Ct Biopsy  12/27/2013   CLINICAL DATA:  Right lung lesion.  Tissue diagnosis is needed.  EXAM: CT-GUIDED BIOPSY OF RIGHT LUNG LESION  Physician: Stephan Minister. Henn, MD  MEDICATIONS: Versed 2 mg, fentanyl 50 mcg. A radiology nurse monitored the patient for moderate sedation.  ANESTHESIA/SEDATION: Sedation time: 29 min  PROCEDURE: The procedure was explained to the patient. The risks and benefits of the procedure were discussed and the patient's  questions were addressed. Informed consent was obtained from the patient. Patient was placed supine on the CT scanner. Images through the chest were obtained. The lesion in the periphery of the right upper lobe was identified. The patient's right axilla was shaved. The right axilla was prepped and draped in sterile fashion. Skin was anesthetized with 1% lidocaine. Using CT guidance, a 17 gauge needle was directed into the peripheral lesion. Needle placement was confirmed within the lesion. A total of 3 core biopsies were performed with an 18 gauge core device. Two adequate specimens were obtained. Samples were placed in formalin. 17 gauge needle was removed without complication. A small bandage was placed over the puncture site.  COMPLICATIONS: None  FINDINGS: Pleural based lesion in the lateral right upper lobe. There is mild irregularity of the adjacent right third rib which may be contributing to the patient's symptoms in this area. Patient has diffuse centrilobular emphysema. Needle placement confirmed within the lesion. No evidence for a pneumothorax following the core biopsies.  IMPRESSION: CT-guided core biopsies of the right lung mass.   Electronically Signed   By: Markus Daft M.D.   On: 12/27/2013 13:56    PATH: Diagnosis Lung, needle/core biopsy(ies), Right upper lobe - ADENOCARCINOMA. - SEE COMMENT. Microscopic Comment Dr. Lynnell Chad has reviewed the case and concurs with this interpretation. EGFR and ALK  Both negative   Recent Lab Findings: Lab Results  Component Value Date   WBC 15.5* 02/01/2014   HGB 10.9* 02/01/2014   HCT 32.0* 02/01/2014   PLT 469* 02/01/2014   GLUCOSE 97 02/01/2014   ALT 12 12/08/2013   AST 19 12/08/2013   NA 137 02/01/2014   K 3.9 02/01/2014   CL 95* 02/01/2014   CREATININE 1.00 02/01/2014   BUN 14 02/01/2014   CO2 25 02/01/2014   INR 0.99 12/27/2013   PFT's: FEV1 2.47  84%   DLCO 21.37   72%   Assessment / Plan:   1. 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  I discussed with the patient the possibility of surgical resection including chest wall resection. At  this point it would seem reasonable to treat this mass as a Pancoast tumor/ consider preoperative radiation to the chest wall with subsequent surgical resection.   2. There is a small focus of hypermetabolic nodular pleural thickening in the left upper lobe       measuring 7 mm (image 60) with mild metabolic activity (SUV max        2.6). This appears unchanged from a CT scan 2012  3.extensive centrilobular emphysema with upper lobe predominance, the patient's pulmonary function studies are noted above. He has remained off cigarettes f I will see the patient back in 4 weeks near the completion of radiation and arrange repeat scan and further discuss surgical resection   Grace Isaac MD      [address] ,[place] [address] Office [phone]   Beeper [phone]

## 2014-02-16 ENCOUNTER — Ambulatory Visit
Admission: RE | Admit: 2014-02-16 | Discharge: 2014-02-16 | Disposition: A | Discharge status: Home / Self Care | Payer: Medicare Other | Source: Ambulatory Visit | Attending: Radiation Oncology | Admitting: Radiation Oncology

## 2014-02-16 ENCOUNTER — Encounter: Payer: Self-pay | Admitting: Radiation Oncology

## 2014-02-16 VITALS — BP 123/61 | HR 86 | Resp 18 | Wt 121.6 lb

## 2014-02-16 DIAGNOSIS — C3410 Malignant neoplasm of upper lobe, unspecified bronchus or lung: Secondary | ICD-10-CM

## 2014-02-16 MED ORDER — OXYCODONE HCL 5 MG PO TABS: 5.0000 mg | ORAL_TABLET | Freq: Four times a day (QID) | ORAL | Status: DC | PRN

## 2014-02-16 NOTE — Progress Notes (Signed)
  Radiation Oncology         [phone] ________________________________  Name: Randy Gould MRN: [id number]  Date: 02/16/2014  DOB: [birth year]  Weekly Radiation Therapy Management  Current Dose: 21.6 Gy     Planned Dose:  45 Gy  Narrative . . . . . . . . The patient presents for routine under treatment assessment.                                  Denies cough or shortness of breath. Reports manageable fatigue. Reports right upper chest and right shoulder pain that increases when he raises his right arm above his head. He reports that oxycodone seems to help more than oxycontin to relieve this pain. Pain managed.Denies skin changes to right upper chest or back within treatment field. Reports using radiaplex gel as directed                                 Set-up films were reviewed.                                 The chart was checked. Physical Findings. . .  weight is 121 lb 9.6 oz (55.157 kg). His blood pressure is 123/61 and his pulse is 86. His respiration is 18 and oxygen saturation is 100%. . Weight essentially stable.  No significant changes. Impression . . . . . . . The patient is tolerating radiation. Plan . . . . . . . . . . . . Continue treatment as planned. Refilled oxycodone.  ________________________________  Sheral Apley [name], M.D.

## 2014-02-16 NOTE — Progress Notes (Signed)
Denies cough or shortness of breath. Reports manageable fatigue. Reports right upper chest and right shoulder pain that increases when he raises his right arm above his head. He reports that oxycodone seems to help more than oxycontin to relieve this pain. Pain managed.Denies skin changes to right upper chest or back within treatment field. Reports using radiaplex gel as directed.

## 2014-02-19 ENCOUNTER — Ambulatory Visit
Admission: RE | Admit: 2014-02-19 | Discharge: 2014-02-19 | Disposition: A | Discharge status: Home / Self Care | Payer: Medicare Other | Source: Ambulatory Visit | Attending: Radiation Oncology | Admitting: Radiation Oncology

## 2014-02-20 ENCOUNTER — Ambulatory Visit
Admission: RE | Admit: 2014-02-20 | Discharge: 2014-02-20 | Disposition: A | Discharge status: Home / Self Care | Payer: Medicare Other | Source: Ambulatory Visit | Attending: Radiation Oncology | Admitting: Radiation Oncology

## 2014-02-21 ENCOUNTER — Ambulatory Visit
Admission: RE | Admit: 2014-02-21 | Discharge: 2014-02-21 | Disposition: A | Discharge status: Home / Self Care | Payer: Medicare Other | Source: Ambulatory Visit | Attending: Radiation Oncology | Admitting: Radiation Oncology

## 2014-02-22 ENCOUNTER — Ambulatory Visit
Admission: RE | Admit: 2014-02-22 | Discharge: 2014-02-22 | Disposition: A | Discharge status: Home / Self Care | Payer: Medicare Other | Source: Ambulatory Visit | Attending: Radiation Oncology | Admitting: Radiation Oncology

## 2014-02-23 ENCOUNTER — Ambulatory Visit
Admission: RE | Admit: 2014-02-23 | Discharge: 2014-02-23 | Disposition: A | Discharge status: Home / Self Care | Payer: Medicare Other | Source: Ambulatory Visit | Attending: Radiation Oncology | Admitting: Radiation Oncology

## 2014-02-23 ENCOUNTER — Encounter: Payer: Self-pay | Admitting: Radiation Oncology

## 2014-02-23 VITALS — BP 119/65 | HR 89 | Resp 18 | Wt 121.2 lb

## 2014-02-23 DIAGNOSIS — C3410 Malignant neoplasm of upper lobe, unspecified bronchus or lung: Secondary | ICD-10-CM

## 2014-02-23 NOTE — Progress Notes (Signed)
Denies cough or shortness of breath. Reports manageable fatigue. Reports right upper chest and right shoulder pain that increases when he raises his right arm above his head. He reports that oxycodone seems to help more than oxycontin to relieve this pain. Pain managed.Denies skin changes to right upper chest or back within treatment field. Reports using radiaplex gel as directed. Report slight dysuria has returned. Wife has contact PCP reference dysuria.

## 2014-02-25 ENCOUNTER — Encounter: Payer: Self-pay | Admitting: Radiation Oncology

## 2014-02-25 NOTE — Progress Notes (Signed)
  Radiation Oncology         [phone] ________________________________  Name: Randy Gould MRN: [id number]  Date: 02/23/2014  DOB: 1941/01/30  Weekly Radiation Therapy Management  Current Dose: 30.6 Gy     Planned Dose:  45 Gy  Narrative . . . . . . . . The patient presents for routine under treatment assessment.                                   Denies cough or shortness of breath. Reports manageable fatigue. Reports right upper chest and right shoulder pain that increases when he raises his right arm above his head. He reports that oxycodone seems to help more than oxycontin to relieve this pain. Pain managed.Denies skin changes to right upper chest or back within treatment field. Reports using radiaplex gel as directed. Report slight dysuria has returned. Wife has contact PCP reference dysuria                                 Set-up films were reviewed.                                 The chart was checked. Physical Findings. . .  weight is 121 lb 3.2 oz (54.976 kg). His blood pressure is 119/65 and his pulse is 89. His respiration is 18 and oxygen saturation is 100%. . Weight essentially stable.  No significant changes. Impression . . . . . . . The patient is tolerating radiation. Plan . . . . . . . . . . . . Continue treatment as planned.  ________________________________  Sheral Apley. [name], M.D.

## 2014-02-26 ENCOUNTER — Ambulatory Visit
Admission: RE | Admit: 2014-02-26 | Discharge: 2014-02-26 | Disposition: A | Discharge status: Home / Self Care | Payer: Medicare Other | Source: Ambulatory Visit | Attending: Radiation Oncology | Admitting: Radiation Oncology

## 2014-02-27 ENCOUNTER — Ambulatory Visit
Admission: RE | Admit: 2014-02-27 | Discharge: 2014-02-27 | Disposition: A | Discharge status: Home / Self Care | Payer: Medicare Other | Source: Ambulatory Visit | Attending: Radiation Oncology | Admitting: Radiation Oncology

## 2014-02-28 ENCOUNTER — Ambulatory Visit
Admission: RE | Admit: 2014-02-28 | Discharge: 2014-02-28 | Disposition: A | Discharge status: Home / Self Care | Payer: Medicare Other | Source: Ambulatory Visit | Attending: Radiation Oncology | Admitting: Radiation Oncology

## 2014-03-01 ENCOUNTER — Ambulatory Visit
Admission: RE | Admit: 2014-03-01 | Discharge: 2014-03-01 | Disposition: A | Discharge status: Home / Self Care | Payer: Medicare Other | Source: Ambulatory Visit | Attending: Radiation Oncology | Admitting: Radiation Oncology

## 2014-03-01 ENCOUNTER — Encounter: Payer: Self-pay | Admitting: Radiation Oncology

## 2014-03-01 NOTE — Progress Notes (Signed)
  Radiation Oncology         [phone] ________________________________  Name: Randy Gould MRN: [id number]  Date: 03/02/2014  DOB: 02-21-1941  Weekly Radiation Therapy Management  Current Dose: 39.6 Gy     Planned Dose:  45 Gy  Narrative . . . . . . . . The patient presents for routine under treatment assessment.                                  Denies cough or shortness of breath. Reports manageable fatigue. Reports right upper chest and right shoulder pain that increases when he raises his right arm above his head. He reports that oxycodone seems to help more than oxycontin to relieve this pain. Pain managed.Hyperpigmentation without desquamation noted right upper back. Reports using radiaplex gel as directed. Reports dysuria has continued but, isn't worse. Patient explains he has not been able to get in with a physician to manage dysuria. Reports aching that is intense at times in his tail bone and hips x 1 week. Colonoscopy scheduled for 03/05/2014                                 Set-up films were reviewed.                                 The chart was checked. Physical Findings. . .  weight is 123 lb 9.6 oz (56.065 kg). His blood pressure is 124/65 and his pulse is 88. His oxygen saturation is 97%. . Weight essentially stable.  No significant changes. Impression . . . . . . . The patient is tolerating radiation. Plan . . . . . . . . . . . . Continue treatment as planned.  Repeat UA to verify clearance of UTI  ________________________________  Sheral Apley. [name], M.D.

## 2014-03-02 ENCOUNTER — Telehealth: Payer: Self-pay | Admitting: *Deleted

## 2014-03-02 ENCOUNTER — Ambulatory Visit
Admission: RE | Admit: 2014-03-02 | Discharge: 2014-03-02 | Disposition: A | Discharge status: Home / Self Care | Payer: Medicare Other | Source: Ambulatory Visit | Attending: Radiation Oncology | Admitting: Radiation Oncology

## 2014-03-02 ENCOUNTER — Encounter: Payer: Self-pay | Admitting: Radiation Oncology

## 2014-03-02 VITALS — BP 124/65 | HR 88 | Wt 123.6 lb

## 2014-03-02 DIAGNOSIS — R300 Dysuria: Secondary | ICD-10-CM

## 2014-03-02 DIAGNOSIS — N390 Urinary tract infection, site not specified: Secondary | ICD-10-CM | POA: Insufficient documentation

## 2014-03-02 DIAGNOSIS — C3410 Malignant neoplasm of upper lobe, unspecified bronchus or lung: Secondary | ICD-10-CM

## 2014-03-02 LAB — URINALYSIS, MICROSCOPIC - CHCC
BILIRUBIN (URINE): NEGATIVE
Glucose: NEGATIVE mg/dL
Ketones: NEGATIVE mg/dL
NITRITE: POSITIVE
Protein: <30 mg/dL
Specific Gravity, Urine: 1.02 (ref 1.003–1.035)
UROBILINOGEN UR: 0.2 mg/dL (ref 0.2–1)
pH: 6 (ref 4.6–8.0)

## 2014-03-02 MED ORDER — CIPROFLOXACIN HCL 500 MG PO TABS: 500.0000 mg | ORAL_TABLET | Freq: Two times a day (BID) | ORAL | Status: DC

## 2014-03-02 NOTE — Addendum Note (Signed)
Encounter addended by: Heywood Footman, RN on: 03/02/2014  1:47 PM<BR>     Documentation filed: Visit Diagnoses, Orders

## 2014-03-02 NOTE — Telephone Encounter (Signed)
error 

## 2014-03-02 NOTE — Progress Notes (Addendum)
Denies cough or shortness of breath. Reports manageable fatigue. Reports right upper chest and right shoulder pain that increases when he raises his right arm above his head. He reports that oxycodone seems to help more than oxycontin to relieve this pain. Pain managed.Hyperpigmentation without desquamation noted right upper back.  Reports using radiaplex gel as directed. Reports dysuria has continued but, isn't worse. Patient explains he has not been able to get in with a physician to manage dysuria. Reports aching that is intense at times in his tail bone and hips x 1 week. Colonoscopy scheduled for 03/05/2014.

## 2014-03-02 NOTE — Addendum Note (Signed)
Encounter addended by: Lora Paula, MD on: 03/02/2014  6:03 PM<BR>     Documentation filed: Visit Diagnoses, Orders

## 2014-03-02 NOTE — Telephone Encounter (Signed)
Called patient home phone [phone], his cell phone [phone] messages that rx for [facility]ipro is at  [facility] [place], then [facility] [phone], ,pharmacist stated they were working on this rx as we speak, they will have it ready for the patient, I dod let her know that I couldn't get in touch with Mr.Arvie and if they would call him tomorrow if it hasn't been p[icked up[ 6:14 PM

## 2014-03-04 LAB — URINE CULTURE

## 2014-03-05 ENCOUNTER — Ambulatory Visit (AMBULATORY_SURGERY_CENTER): Payer: Self-pay | Admitting: *Deleted

## 2014-03-05 ENCOUNTER — Ambulatory Visit
Admission: RE | Admit: 2014-03-05 | Discharge: 2014-03-05 | Disposition: A | Discharge status: Home / Self Care | Payer: Medicare Other | Source: Ambulatory Visit | Attending: Radiation Oncology | Admitting: Radiation Oncology

## 2014-03-05 ENCOUNTER — Ambulatory Visit: Payer: Medicare Other

## 2014-03-05 ENCOUNTER — Telehealth: Payer: Self-pay | Admitting: Radiation Oncology

## 2014-03-05 VITALS — Ht 68.0 in | Wt 122.8 lb

## 2014-03-05 DIAGNOSIS — Z85038 Personal history of other malignant neoplasm of large intestine: Secondary | ICD-10-CM

## 2014-03-05 MED ORDER — MOVIPREP 100 G PO SOLR: ORAL | Status: DC

## 2014-03-05 NOTE — Telephone Encounter (Signed)
Phoned patient's residence. Spoke with his wife. She confirmed he picked up the Cipro and is on Day 2. Thanked her for her time. She expressed appreciation for the call.

## 2014-03-05 NOTE — Progress Notes (Signed)
Patient denies any allergies to eggs or soy. Patient denies any problems with anesthesia. Pt denies any O2 use.

## 2014-03-06 ENCOUNTER — Encounter: Payer: Self-pay | Admitting: Radiation Oncology

## 2014-03-06 ENCOUNTER — Ambulatory Visit
Admission: RE | Admit: 2014-03-06 | Discharge: 2014-03-06 | Disposition: A | Discharge status: Home / Self Care | Payer: Medicare Other | Source: Ambulatory Visit | Attending: Radiation Oncology | Admitting: Radiation Oncology

## 2014-03-06 NOTE — Telephone Encounter (Signed)
error 

## 2014-03-06 NOTE — Progress Notes (Signed)
Culture shows ecoli is sensitive to cipro.  Pt given script by [name].

## 2014-03-07 ENCOUNTER — Ambulatory Visit
Admission: RE | Admit: 2014-03-07 | Discharge: 2014-03-07 | Disposition: A | Discharge status: Home / Self Care | Payer: Medicare Other | Source: Ambulatory Visit | Attending: Radiation Oncology | Admitting: Radiation Oncology

## 2014-03-07 ENCOUNTER — Encounter: Payer: Self-pay | Admitting: Radiation Oncology

## 2014-03-07 ENCOUNTER — Other Ambulatory Visit: Payer: Self-pay | Admitting: Radiation Oncology

## 2014-03-07 VITALS — BP 120/65 | HR 86 | Resp 16 | Wt 124.1 lb

## 2014-03-07 DIAGNOSIS — C3410 Malignant neoplasm of upper lobe, unspecified bronchus or lung: Secondary | ICD-10-CM

## 2014-03-07 DIAGNOSIS — N390 Urinary tract infection, site not specified: Secondary | ICD-10-CM

## 2014-03-07 DIAGNOSIS — C20 Malignant neoplasm of rectum: Secondary | ICD-10-CM

## 2014-03-07 MED ORDER — OXYCODONE HCL 5 MG PO TABS: 5.0000 mg | ORAL_TABLET | Freq: Four times a day (QID) | ORAL | Status: DC | PRN

## 2014-03-07 MED ORDER — OXYCODONE HCL ER 20 MG PO T12A: 20.0000 mg | EXTENDED_RELEASE_TABLET | Freq: Two times a day (BID) | ORAL | Status: DC

## 2014-03-07 NOTE — Progress Notes (Signed)
   Weekly Management Note:  outpatient Current Dose:  45 Gy  Projected Dose: 45 Gy   Narrative:  The patient presents for routine under treatment assessment.  CBCT/MVCT images/Port film x-rays were reviewed.  The chart was checked. No new complaints. He is almost out of pain meds.  Physical Findings:  weight is 124 lb 1.6 oz (56.291 kg). His blood pressure is 120/65 and his pulse is 86. His respiration is 16 and oxygen saturation is 99%.  NAD, ambulatory  CBC    Component Value Date/Time   WBC 15.5* 02/01/2014 1413   RBC 3.97* 02/01/2014 1413   HGB 10.9* 02/01/2014 1413   HCT 32.0* 02/01/2014 1413   PLT 469* 02/01/2014 1413   MCV 80.6 02/01/2014 1413   MCH 27.5 02/01/2014 1413   MCHC 34.1 02/01/2014 1413   RDW 14.4 02/01/2014 1413   LYMPHSABS 1.5 02/01/2014 1413   MONOABS 1.1* 02/01/2014 1413   EOSABS 0.0 02/01/2014 1413   BASOSABS 0.1 02/01/2014 1413     CMP     Component Value Date/Time   NA 137 02/01/2014 1413   K 3.9 02/01/2014 1413   CL 95* 02/01/2014 1413   CO2 25 02/01/2014 1413   GLUCOSE 97 02/01/2014 1413   BUN 14 02/01/2014 1413   CREATININE 1.00 02/01/2014 1413   CALCIUM 9.4 02/01/2014 1413   PROT 8.4* 12/08/2013 1202   ALBUMIN 3.5 12/08/2013 1202   AST 19 12/08/2013 1202   ALT 12 12/08/2013 1202   ALKPHOS 83 12/08/2013 1202   BILITOT 0.4 12/08/2013 1202   GFRNONAA 73* 02/01/2014 1413   GFRAA 85* 02/01/2014 1413     Impression:  The patient has tolerated radiotherapy.   Plan:  F/u in 1 mo with Dr. Merri Ray for oxycodone and oxycontin given today. -----------------------------------  Eppie Gibson, MD

## 2014-03-07 NOTE — Progress Notes (Signed)
Denies cough, shortness of breath or difficulty swallowing. Reports right shoulder pain has improved but, some discomfort remains. Patient completed treatment today and has been scheduled to follow up with Dr. [name] in one month on 04/05/2014. Patient aware of this appointment. Reports low pelvic pain continues but, subsides with the help of oxycodone 5 mg and oxycontin 20 mg. Patient requesting refill for both of these medications today. Hyperpigmented right upper chest/treatment area without desquamation noted. Encouraged patient to continue radiaplex bid for two weeks, provided patient with addition tube and he verbalized understanding. Reports he began taking cipro prescribed by Dr. [name] for 14 days on 03/02/2014. Reports burning with urination has almost gone away.

## 2014-03-08 ENCOUNTER — Encounter: Payer: Self-pay | Admitting: Gastroenterology

## 2014-03-12 ENCOUNTER — Telehealth: Payer: Self-pay | Admitting: Gastroenterology

## 2014-03-12 ENCOUNTER — Telehealth: Payer: Self-pay | Admitting: Radiation Oncology

## 2014-03-12 NOTE — Progress Notes (Signed)
  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 6 megavolt photons were applied all fields. Positioning was accomplished daily with cone beam CT prior to each fraction to provide couch shifts.  Narrative: The patient tolerated radiation treatment relatively well.   The patient chest radiation was well-tolerated with no esophagitis symptoms or pulmonary symptoms. However, patient did suffer with a recalcitrant urinary tract infection and pelvic pain.  Plan: The patient has completed radiation treatment. The patient will return to radiation oncology clinic for routine followup in one month. I advised him to call or return sooner if he has any questions or concerns related to his recovery or treatment. The patient was referred to urology for further evaluation and management of his urinary tract infection. The patient was scheduled for colonoscopy with Dr. Fuller Plan to followup on the anastomotic activity seen on staging PET/CT. This could be related to locally recurrent disease versus chronic wound issues related to his anastomotic failure. ________________________________  Sheral Apley. [name], M.D.

## 2014-03-12 NOTE — Telephone Encounter (Signed)
Returned message left by patient on answering machine. Patient reports severe groin and rectal pain "inside." Patient reports taking 2 tylenol, 2 motrin, and 2 oxycodone 5 mg tablets to numb this pain. However, patient reports when the pain medication wears off the pain returns. Patient reports he contacted Dr. Fuller Plan, physician planning to do his colonoscopy, and he encouraged the patient proceed with colonoscopy and contact Dr. [name] for a referral to a urologist. Explained this writer would inform Dr. [name] of these findings and call back with direction. Patient verbalized understanding.

## 2014-03-12 NOTE — Telephone Encounter (Signed)
Dr. [name] can refer him to a Urologist to further evaluate his symptoms. OK to proceed with colonoscopy unless he is having too much pain and wants to delay the colonoscopy.

## 2014-03-12 NOTE — Telephone Encounter (Signed)
He has a history of rectal cancer in 05, and is being treated for lung CA now.  Patient reports that he is having lower pelvic pain that started approximately 1 week ago.  Dr. [name] has recently treated him for a UTI with cipro, but the symptoms have not resolved.  He reports the pain in very low and it radiates to his testicles.  See phone note from 01/18/14.  He is scheduled for a colonoscopy for you on Friday.  Is it ok to keep the appt, does he need seen in the office prior to with an APP ?

## 2014-03-12 NOTE — Telephone Encounter (Signed)
Patient notified.  He will notify Dr. [name].  He will keep the colonoscopy as scheduled for Friday

## 2014-03-12 NOTE — Telephone Encounter (Signed)
Randy Gould,  I placed order for referral to urology, first available at [facility].  MM

## 2014-03-13 ENCOUNTER — Telehealth: Payer: Self-pay | Admitting: *Deleted

## 2014-03-13 NOTE — Telephone Encounter (Signed)
Called patient to inform of appt. With Dr. Karsten Ro on 03-27-14- arrival time - 2:45 pm, spoke with patient and he is aware of this appt.

## 2014-03-15 ENCOUNTER — Other Ambulatory Visit: Payer: Self-pay | Admitting: *Deleted

## 2014-03-15 ENCOUNTER — Encounter: Payer: Self-pay | Admitting: Cardiothoracic Surgery

## 2014-03-15 ENCOUNTER — Ambulatory Visit (INDEPENDENT_AMBULATORY_CARE_PROVIDER_SITE_OTHER): Payer: Medicare Other | Admitting: Cardiothoracic Surgery

## 2014-03-15 VITALS — BP 128/67 | HR 80 | Resp 16 | Ht 69.0 in | Wt 125.0 lb

## 2014-03-15 DIAGNOSIS — C189 Malignant neoplasm of colon, unspecified: Secondary | ICD-10-CM

## 2014-03-15 DIAGNOSIS — R071 Chest pain on breathing: Secondary | ICD-10-CM

## 2014-03-15 DIAGNOSIS — R0789 Other chest pain: Secondary | ICD-10-CM

## 2014-03-15 DIAGNOSIS — C3410 Malignant neoplasm of upper lobe, unspecified bronchus or lung: Secondary | ICD-10-CM

## 2014-03-15 NOTE — Progress Notes (Signed)
[address]       [place] [address]             [phone]                         Randy Gould [facility] Medical Record #[id number] Date of Birth: 03/03/1941  Referring: Dr [name]  Primary Care: [name], MD  Chief Complaint:   Right chest wall pain   History of Present Illness:    Randy Gould 73 y.o. male is seen in the office for rt chest wall pain with chest wall and lung mass. The patient was treated for stage T3 N1 rectal cancer at age 85 with LAR followed by chemoradiotherapy complicated by anastomosis breakdown requiring revision multiple surgeries as recently as 2005. This ultimately required colostomy for diversion. More recently, he presented with chest pain on right side. Chest X-Ray and subsequent CT angio showed a 5.7 cm right  upper lobe peripheral mass with likely chest wall invasion. PET scan confirmed hypermetabolisim there without mediastinal lymphadenopathy suggesting T3 N0 disease, with some hypermetabolic pelvic nodes of uncertain significance. CT biopsy of the lung mass shows adenocarcinoma. ALK & EGFR  Negative.  There is a small focus of hypermetabolic nodular pleural thickening in the left upper lobe measuring 7 mm (image 60) with mild metabolic activity (SUV max 2.6).  He was referred to [facility] . Since that visit the patient has had MRI of the brain and full set of pulmonary function studies to evaluate for possible resection.  Patient has remained smoke free for the last 10 days..  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   The patient has completed radiation treatments to the right upper lobe chest wall mass. He notes 95% improvement in chest wall pain. He has had recurrent urinary tract infection, complains of increasing pain in the pelvis, described as bony pain made worse by sitting. Also notes some mild testicular pain. Patient has colonoscopy scheduled for tomorrow and a urology referral  guarding made by Dr. [name].       Current Activity/ Functional Status:  Patient is independent with mobility/ambulation, transfers, ADL's, IADL's.   Zubrod Score: At the time of surgery this patient's most appropriate activity status/level should be described as: []     0    Normal activity, no symptoms [x]     1    Restricted in physical strenuous activity but ambulatory, able to do out light work []     2    Ambulatory and capable of self care, unable to do work activities, up and about               >50 % of waking hours                              []     3    Only limited self care, in bed greater than 50% of waking hours []     4    Completely disabled, no self care, confined to bed or chair []     5    Moribund   Past Medical History  Diagnosis Date  . Cancer of colon 2003  . Lung cancer     Past Surgical History  Procedure Laterality Date  . Nasal septum surgery    . Colon surgery      x3  .  Colostomy      Family History  Problem Relation Age of Onset  . Stomach cancer Mother   . Colon cancer Neg Hx     History   Social History  . Marital Status: Married    Spouse Name: N/A    Number of Children: N/A  . Years of Education: N/A   Occupational History  . Not on file.   Social History Main Topics  . Smoking status: Current Every Day Smoker -- 1.00 packs/day for 30 years    Types: Cigarettes  . Smokeless tobacco: Not on file  . Alcohol Use: Yes     Comment: 1-2 drinks per weel  . Drug Use: No  . Sexual Activity: Not on file           History  Smoking status  . Former Smoker -- 1.00 packs/day for 30 years  . Types: Cigarettes  . Quit date: 01/10/2014  Smokeless tobacco  . Never Used    History  Alcohol Use No    Comment: pt denies     Allergies  Allergen Reactions  . Ativan [Lorazepam] Other (See Comments)    REACTION: Increase heart rate  . Other Itching and Rash    States "head to toe" itching from a "powerful antibiotic"  administered years ago.  Cannot recall name of antibiotic.    Current Outpatient Prescriptions  Medication Sig Dispense Refill  . acetaminophen (TYLENOL) 650 MG CR tablet Take 650 mg by mouth every 8 (eight) hours as needed for pain.      . ciprofloxacin (CIPRO) 500 MG tablet Take 1 tablet (500 mg total) by mouth 2 (two) times daily. For 14 days  28 tablet  0  . esomeprazole (NEXIUM) 20 MG capsule Take 20 mg by mouth daily at 12 noon.      Marland Kitchen oxyCODONE (OXY IR/ROXICODONE) 5 MG immediate release tablet Take 1-2 tablets (5-10 mg total) by mouth every 6 (six) hours as needed for severe pain. 1 tablet every 3 hours as needed for pain  120 tablet  0  . OxyCODONE (OXYCONTIN) 20 mg T12A 12 hr tablet Take 1 tablet (20 mg total) by mouth every 12 (twelve) hours.  60 tablet  0  . MOVIPREP 100 G SOLR Take as directed  1 kit  0   No current facility-administered medications for this visit.     Review of Systems:     Cardiac Review of Systems: Y or N  Chest Pain [  n  ]  Resting SOB [ n  ] Exertional SOB  Blue.Reese  ]  Orthopnea [ y ]   Pedal Edema [  n ]    Palpitations [ n ] Syncope  [n  ]   Presyncope [n   ]  General Review of Systems: [Y] = yes [  ]=no Constitional: recent weight change [ n ];  Wt loss over the last 3 months [   ] anorexia [n  ]; fatigue [ y ]; nausea [n  ]; night sweats [n  ]; fever [  ]; or chillsn [  ];          Dental: poor dentition[  n]; Last Dentist visit:   Eye : blurred vision [ n ]; diplopia [   ]; vision changes [n  ];  Amaurosis fugax[  ]; Resp: cough [  ];  wheezing[ y ];  hemoptysis[ y ]; shortness of breath[y  ]; paroxysmal nocturnal dyspnea[y  ]; dyspnea on exertion[  y];  or orthopnea[  ];  GI:  gallstones[  ], vomiting[  ];  dysphagia[  ]; melena[  ];  hematochezia [  ]; heartburn[  ];   Hx of  Colonoscopy[  ]; GU: kidney stones [n  ]; hematuria[ n ];   dysuria [  ];  nocturia[n  ];  history of     obstruction [  ]; urinary frequency [  ]             Skin: rash, swelling[   ];, hair loss[  ];  peripheral edema[  ];  or itching[  ]; Musculosketetal: myalgias[  ];  joint swelling[  ];  joint erythema[  ];  joint pain[  ];  back pain[  ];  Heme/Lymph: bruising[  ];  bleeding[  ];  anemia[  ];  Neuro: TIA[ n ];  headaches[ n ];  stroke[  ];  vertigo[  ];  seizures[ n ];   paresthesias[  ];  difficulty walking[  ];  Psych:depression[  ]; anxiety[  ];  Endocrine: diabetes[  ];  thyroid dysfunction[  ];  Immunizations: Flu up to date [  n]; Pneumococcal up to date [  n];  Other:  Physical Exam: BP 128/67  Pulse 80  Resp 16  Ht 5' 9"  (1.753 m)  Wt 125 lb (56.7 kg)  BMI 18.45 kg/m2  SpO2 98%  PHYSICAL EXAMINATION:  General appearance: alert, cooperative, appears older than stated age, cachectic and no distress Neurologic: intact Heart: regular rate and rhythm, S1, S2 normal, no murmur, click, rub or gallop Lungs: diminished breath sounds bilaterally Abdomen: soft, non-tender; bowel sounds normal; no masses,  no organomegaly Extremities: extremities normal, atraumatic, no cyanosis or edema and Homans sign is negative, no sign of DVT no cervical or axillary adenopthy   Diagnostic Studies & Laboratory data:     Recent Radiology Findings:  Mr Kizzie Fantasia Contrast  01/09/2014   CLINICAL DATA:  Lung cancer.  Remote history of colon cancer.  EXAM: MRI HEAD WITHOUT AND WITH CONTRAST  TECHNIQUE: Multiplanar, multiecho pulse sequences of the brain and surrounding structures were obtained without and with intravenous contrast.  CONTRAST:  69m MULTIHANCE GADOBENATE DIMEGLUMINE 529 MG/ML IV SOLN  COMPARISON:  CT scan 12/21/2013.  FINDINGS: No acute infarct, hemorrhage, or mass lesion is present. Postcontrast images demonstrate no pathologic enhancement to suggest metastatic disease of the brain or meninges. Midline structures are within normal limits.  Flow is present in the major intracranial arteries. The left lens has been replaced. The globes and orbits are otherwise  intact. The a lateral recess of the right sphenoid sinus is opacified. The paranasal sinuses and mastoid air cells are otherwise clear.  IMPRESSION: 1. No evidence for metastatic disease the brain or meninges. 2. MRI appearance of the brain for age 89 Opacification of the lateral recess of the right sphenoid sinus.   Electronically Signed   By: CLawrence SantiagoM.D.   On: 01/09/2014 14:42     Ct Angio Chest Pe W/cm &/or Wo Cm  12/12/2013   CLINICAL DATA:  Right upper chest burning sensation or 3 weeks question pulmonary embolism, history colon cancer  EXAM: CT ANGIOGRAPHY CHEST WITH CONTRAST  TECHNIQUE: Multidetector CT imaging of the chest was performed using the standard protocol during bolus administration of intravenous contrast. Multiplanar CT image reconstructions including MIPs were obtained to evaluate the vascular anatomy.  CONTRAST:  882mOMNIPAQUE IOHEXOL 350 MG/ML SOLN  COMPARISON:  CT chest with contrast 03/05/2011  FINDINGS: Atherosclerotic calcifications aorta without aneurysm or dissection.  Pulmonary arteries patent.  No evidence of pulmonary embolism.  No thoracic adenopathy.  Nonspecific 6 mm low-attenuation focus last segment left lobe liver image 98 unchanged.  Remaining visualized portions of upper abdomen normal appearance.  Peripheral mass identified at the lateral aspect of the right upper hemithorax, contiguous with the pleural surface over a broad base, more confluent and mass-like in appearance versus the previous exam.  Finding is worrisome for a Pancoast tumor measuring 5.7 x 2.7 x 4.6 cm  Suspected associated bone destruction of the lateral aspect of the right 3rd rib.  Underlying COPD changes with calcified granuloma at the right upper lobe.  Peripheral areas of scarring in both lungs predominantly upper lobes.  Subsegmental atelectasis right lower lobe.  Remaining lungs clear.  No pleural effusion or pneumothorax.  No additional osseous findings.  Review of the MIP images confirms  the above findings.  IMPRESSION: No evidence of pulmonary embolism.  Peripheral tumor with broad pleural base at the lateral aspect of the right upper lobe 5.7 x 2.7 x 4.6 cm in size worrisome for a Pancoast tumor; recommend tissue diagnosis with PET-CT imaging. .  Associated bone destruction of the lateral aspect of the right 3rd rib is suspected.  Severe underlying COPD and old granulomatous disease.   Electronically Signed   By: Lavonia Dana M.D.   On: 12/12/2013 15:25   Nm Pet Image Initial (pi) Skull Base To Thigh  12/21/2013   CLINICAL DATA:  Initial treatment strategy for lung mass. Prior history of colon cancer.  EXAM: NUCLEAR MEDICINE PET SKULL BASE TO THIGH  FASTING BLOOD GLUCOSE:  Value: 27m/dl  TECHNIQUE: 19.5 mCi F-18 FDG was injected intravenously. CT data was obtained and used for attenuation correction and anatomic localization only. (This was not acquired as a diagnostic CT examination.) Additional exam technical data entered on technologist worksheet.  COMPARISON:  CT ANGIO CHEST W/CM &/OR WO/CM dated 12/12/2013; DG CHEST 2 VIEW dated 12/08/2013; CT CHEST W/CM dated 03/05/2011  FINDINGS: NECK  No hypermetabolic lymph nodes in the neck.  CHEST  There is a hypermetabolic mass in the lateral right upper lobe measuring 5.1 x 2.7 cm with intense metabolic activity (SUV max 16.8). This mass shares a broad surface with the chest wall and appears to involve adjacent ribs. There is a small focus of hypermetabolic nodular pleural thickening in the left upper lobe measuring 7 mm (image 60) with mild metabolic activity (SUV max 2.6). There is extensive centrilobular emphysema with upper lobe predominance. No hypermetabolic mediastinal lymph nodes.  ABDOMEN/PELVIS  There is asymmetric hypermetabolic activity associated with the lower rectal region at site of prior surgical resection and surgical clips. Difficult to define the tissue planes at this level due to CT technique and lack of contrast. Hypermetabolic  activity is seen on CT image 206 just anterior to the sacrum on the right. Second nodular focus of hypermetabolic activity in the deep pelvis just right of midline on image 194 without clear definable lesion on the CT portion. A thirdfocus of activity is noted medial to the left psoas muscle (image 183. This could represent ureteral activity. There is physiologic activity noted in the left lower quadrant colostomy. No abnormal metabolic activity within the liver or adrenal glands.  SKELETON  No focal hypermetabolic activity to suggest skeletal metastasis.  IMPRESSION: 1. Hypermetabolic right upper lobe mass the chest wall is concerning for bronchogenic carcinoma. Recommend tissue sampling. 2. Hypermetabolic mild nodular thickening  in the left upper lobe is less specific and may be inflammatory. Recommend attention on follow-up. 3. No hypermetabolic mediastinal lymph nodes. 4. Asymmetric hypermetabolic activity in the deep right pelvis adjacent to the sacrum with differential including local recurrence versus potential deep pelvic infection or physiologic activity. Two additional foci of activity in the pelvis could represent local metastasis or physiologic activity. Recommend correlation with CEA and if elevated recommend contrast-enhanced CT of the pelvis for further evaluation.   Electronically Signed   By: Suzy Bouchard M.D.   On: 12/21/2013 16:20   Ct Biopsy  12/27/2013   CLINICAL DATA:  Right lung lesion.  Tissue diagnosis is needed.  EXAM: CT-GUIDED BIOPSY OF RIGHT LUNG LESION  Physician: Stephan Minister. Henn, MD  MEDICATIONS: Versed 2 mg, fentanyl 50 mcg. A radiology nurse monitored the patient for moderate sedation.  ANESTHESIA/SEDATION: Sedation time: 29 min  PROCEDURE: The procedure was explained to the patient. The risks and benefits of the procedure were discussed and the patient's questions were addressed. Informed consent was obtained from the patient. Patient was placed supine on the CT scanner. Images  through the chest were obtained. The lesion in the periphery of the right upper lobe was identified. The patient's right axilla was shaved. The right axilla was prepped and draped in sterile fashion. Skin was anesthetized with 1% lidocaine. Using CT guidance, a 17 gauge needle was directed into the peripheral lesion. Needle placement was confirmed within the lesion. A total of 3 core biopsies were performed with an 18 gauge core device. Two adequate specimens were obtained. Samples were placed in formalin. 17 gauge needle was removed without complication. A small bandage was placed over the puncture site.  COMPLICATIONS: None  FINDINGS: Pleural based lesion in the lateral right upper lobe. There is mild irregularity of the adjacent right third rib which may be contributing to the patient's symptoms in this area. Patient has diffuse centrilobular emphysema. Needle placement confirmed within the lesion. No evidence for a pneumothorax following the core biopsies.  IMPRESSION: CT-guided core biopsies of the right lung mass.   Electronically Signed   By: Markus Daft M.D.   On: 12/27/2013 13:56    PATH: Diagnosis Lung, needle/core biopsy(ies), Right upper lobe - ADENOCARCINOMA. - SEE COMMENT. Microscopic Comment Dr. Lynnell Chad has reviewed the case and concurs with this interpretation. EGFR and ALK  Both negative   Recent Lab Findings: Lab Results  Component Value Date   WBC 15.5* 02/01/2014   HGB 10.9* 02/01/2014   HCT 32.0* 02/01/2014   PLT 469* 02/01/2014   GLUCOSE 97 02/01/2014   ALT 12 12/08/2013   AST 19 12/08/2013   NA 137 02/01/2014   K 3.9 02/01/2014   CL 95* 02/01/2014   CREATININE 1.00 02/01/2014   BUN 14 02/01/2014   CO2 25 02/01/2014   INR 0.99 12/27/2013   PFT's: FEV1 2.47  84%   DLCO 21.37   72%   Assessment / Plan:   1. 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    We'll plan to proceed with CT scan of the chest abdomen and pelvis both to evaluate the response to radiation in the chest and also because of the increasing pelvic pain and the subtle findings on his previous PET scan. He's  to have a colonoscopy tomorrow and urology consultation in the near future.   2. There is a small focus of hypermetabolic nodular pleural thickening in the left upper lobe       measuring 7 mm (image 60) with mild metabolic activity (SUV max        2.6). This appears unchanged from a CT scan 2012  3.extensive centrilobular emphysema with upper lobe predominance, the patient's pulmonary function studies are noted above. He has remained off cigarettes  I will see the patient back in 1  Week at [facility]      [address] [place] [address] Office [phone]   Beeper [phone]

## 2014-03-16 ENCOUNTER — Encounter: Payer: Self-pay | Admitting: Gastroenterology

## 2014-03-16 ENCOUNTER — Other Ambulatory Visit: Payer: Self-pay | Admitting: Radiation Oncology

## 2014-03-16 ENCOUNTER — Ambulatory Visit (AMBULATORY_SURGERY_CENTER): Payer: Medicare Other | Admitting: Gastroenterology

## 2014-03-16 VITALS — BP 123/57 | HR 73 | Temp 98.8°F | Resp 70 | Ht 68.0 in | Wt 122.0 lb

## 2014-03-16 DIAGNOSIS — K6289 Other specified diseases of anus and rectum: Secondary | ICD-10-CM

## 2014-03-16 DIAGNOSIS — D126 Benign neoplasm of colon, unspecified: Secondary | ICD-10-CM

## 2014-03-16 DIAGNOSIS — Z85038 Personal history of other malignant neoplasm of large intestine: Secondary | ICD-10-CM

## 2014-03-16 DIAGNOSIS — K626 Ulcer of anus and rectum: Secondary | ICD-10-CM

## 2014-03-16 HISTORY — PX: COLONOSCOPY W/ POLYPECTOMY: SHX1380

## 2014-03-16 MED ORDER — SODIUM CHLORIDE 0.9 % IV SOLN: 500.0000 mL | INTRAVENOUS | Status: DC

## 2014-03-16 NOTE — Progress Notes (Signed)
Called to room to assist during endoscopic procedure.  Patient ID and intended procedure confirmed with present staff. Received instructions for my participation in the procedure from the performing physician.  

## 2014-03-16 NOTE — Patient Instructions (Signed)
Discharge instructions given with verbal understanding. Handout on polyps and biopsies taken. Resume previous medications. Hold aspirin and aspirin products for two weeks. YOU HAD AN ENDOSCOPIC PROCEDURE TODAY AT [facility]: Refer to the procedure report that was given to you for any specific questions about what was found during the examination.  If the procedure report does not answer your questions, please call your gastroenterologist to clarify.  If you requested that your care partner not be given the details of your procedure findings, then the procedure report has been included in a sealed envelope for you to review at your convenience later.  YOU SHOULD EXPECT: Some feelings of bloating in the abdomen. Passage of more gas than usual.  Walking can help get rid of the air that was put into your GI tract during the procedure and reduce the bloating. If you had a lower endoscopy (such as a colonoscopy or flexible sigmoidoscopy) you may notice spotting of blood in your stool or on the toilet paper. If you underwent a bowel prep for your procedure, then you may not have a normal bowel movement for a few days.  DIET: Your first meal following the procedure should be a light meal and then it is ok to progress to your normal diet.  A half-sandwich or bowl of soup is an example of a good first meal.  Heavy or fried foods are harder to digest and may make you feel nauseous or bloated.  Likewise meals heavy in dairy and vegetables can cause extra gas to form and this can also increase the bloating.  Drink plenty of fluids but you should avoid alcoholic beverages for 24 hours.  ACTIVITY: Your care partner should take you home directly after the procedure.  You should plan to take it easy, moving slowly for the rest of the day.  You can resume normal activity the day after the procedure however you should NOT DRIVE or use heavy machinery for 24 hours (because of the sedation medicines used  during the test).    SYMPTOMS TO REPORT IMMEDIATELY: A gastroenterologist can be reached at any hour.  During normal business hours, 8:30 AM to 5:00 PM Monday through Friday, call [phone].  After hours and on weekends, please call the GI answering service at [phone] who will take a message and have the physician on call contact you.   Following lower endoscopy (colonoscopy or flexible sigmoidoscopy):  Excessive amounts of blood in the stool  Significant tenderness or worsening of abdominal pains  Swelling of the abdomen that is new, acute  Fever of 100F or higher FOLLOW UP: If any biopsies were taken you will be contacted by phone or by letter within the next 1-3 weeks.  Call your gastroenterologist if you have not heard about the biopsies in 3 weeks.  Our staff will call the home number listed on your records the next business day following your procedure to check on you and address any questions or concerns that you may have at that time regarding the information given to you following your procedure. This is a courtesy call and so if there is no answer at the home number and we have not heard from you through the emergency physician on call, we will assume that you have returned to your regular daily activities without incident.  SIGNATURES/CONFIDENTIALITY: You and/or your care partner have signed paperwork which will be entered into your electronic medical record.  These signatures attest to the fact that that  the information above on your After Visit Summary has been reviewed and is understood.  Full responsibility of the confidentiality of this discharge information lies with you and/or your care-partner.

## 2014-03-16 NOTE — Op Note (Signed)
[facility]  [address] [place], [address]   COLONOSCOPY PROCEDURE REPORT PATIENT: Randy Gould, Randy Gould  MR#: [id number] BIRTHDATE: 02/18/1941 , 73  yrs. old GENDER: Male ENDOSCOPIST: Ladene Artist, MD, [facility] REFERRED YT:[id number] [name], M.D. PROCEDURE DATE:  03/16/2014 PROCEDURE:   Submucosal injection, any substance, Colonoscopy via stoma with snare and flex sigmoidoscopy with biospy First Screening Colonoscopy - Avg.  risk and is 50 yrs.  old or older - No.  Prior Negative Screening - Now for repeat screening. N/A  History of Adenoma - Now for follow-up colonoscopy & has been > or = to 3 yrs.  N/A  Polyps Removed Today? Yes. ASA CLASS:   Class III INDICATIONS:High risk patient with personal history of colon cancer, 2005.  S/P colostomy. MEDICATIONS: MAC sedation, administered by CRNA and propofol (Diprivan) 200mg  IV DESCRIPTION OF PROCEDURE:   After the risks benefits and alternatives of the procedure were thoroughly explained, informed consent was obtained.  A digital exam revealed no abnormalities in the colostomy the LB [id number]  endoscope was introduced through the anus and advanced to the cecum, which was identified by both the appendix and ileocecal valve. No adverse events experienced.   The quality of the prep was good, using MoviPrep The instrument was then slowly withdrawn as the colon was fully examined. The patient was repositioned and a digital rectal exam revealed no abnormalities. The scope was inserted the proximal rectum.  COLON FINDINGS: A sessile polyp measuring 1.5 cm in size was found in the descending colon.  A polypectomy was performed.  A polypectomy was performed using snare cautery.  The resection was complete and the polyp tissue was completely retrieved.  A tattoo was applied both proximal and distal to the site.   The colon was otherwise normal.  There was no diverticulosis, inflammation, polyps or cancers unless  previously stated.  The scope was then reinserted into the rectum. 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 I did not attempt to examine beyond the rectum given the severity of the proctitis.  The time to cecum=1 minutes 15 seconds.  Withdrawal time=15 minutes 44 seconds.  The scope was withdrawn and the procedure completed. COMPLICATIONS: There were no complications.  ENDOSCOPIC IMPRESSION: 1.   Sessile polyp measuring 1.5 cm in the descending colon; polypectomy performed using snare cautery; a tattoo was applied 3.    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  Await pathology results 2.  Hold aspirin, aspirin products, and anti-inflammatory medication for 2 weeks. 3.  Await pathology and his clinical course to determine follow up procedure interval  eSigned:  Ladene Artist, MD, [facility] 03/16/2014 4:09 PM   cc: [name], MD   PATIENT NAME:  Randy Gould, Randy Gould MR#: [id number]

## 2014-03-16 NOTE — Progress Notes (Signed)
Procedure ends, to recovery, report given and VSS. 

## 2014-03-19 ENCOUNTER — Telehealth: Payer: Self-pay | Admitting: *Deleted

## 2014-03-19 NOTE — Telephone Encounter (Signed)
Phoned [facility]. Confirmed appointment is set for 03/27/2014. Requested office note be faxed to Dr. [name] following appt.

## 2014-03-19 NOTE — Telephone Encounter (Signed)
  Follow up Call-  Call back number 03/16/2014  Post procedure Call Back phone  # [phone]  Permission to leave phone message Yes     Patient questions:  Do you have a fever, pain , or abdominal swelling? no Pain Score  0 *  Have you tolerated food without any problems? yes  Have you been able to return to your normal activities? yes  Do you have any questions about your discharge instructions: Diet   no Medications  no Follow up visit  no  Do you have questions or concerns about your Care? no  Actions: * If pain score is 4 or above:pt's wife answered phone .she says pt did not have any problems from colonoscopy Friday,she says pt has pain in pelvic area that he had prior to colonoscopy .Says they are waiting to hear drom biopsy results of colonoscopy and to hear from his other physicians and that he has a ct scan and MRI Wednesday, but denies complications after colonoscopy No action needed, pain <4.

## 2014-03-20 ENCOUNTER — Other Ambulatory Visit: Payer: Self-pay | Admitting: *Deleted

## 2014-03-20 ENCOUNTER — Other Ambulatory Visit: Payer: Self-pay | Admitting: Radiation Oncology

## 2014-03-20 ENCOUNTER — Telehealth: Payer: Self-pay | Admitting: Radiation Oncology

## 2014-03-20 DIAGNOSIS — C3410 Malignant neoplasm of upper lobe, unspecified bronchus or lung: Secondary | ICD-10-CM

## 2014-03-20 DIAGNOSIS — C189 Malignant neoplasm of colon, unspecified: Secondary | ICD-10-CM

## 2014-03-20 MED ORDER — OXYCODONE HCL 5 MG PO TABS: 5.0000 mg | ORAL_TABLET | Freq: Four times a day (QID) | ORAL | Status: DC | PRN

## 2014-03-20 NOTE — Telephone Encounter (Signed)
Wife called in on behalf of husband requesting oxy IR refill. Explains her husband continues to have pain between his rectum and testicles that feels like a broken bone stabbing. Dr. [name] out of the office today. Requested Dr. Valere Dross refill this script. Wife understands the patient can pick up script at the nurses station in radiation oncology.  Noting at Dr. Charlton Amor request that this medication was filled to manage pain until patient could get in on 03/26/2014 to be evaluated by urologist.

## 2014-03-21 ENCOUNTER — Telehealth: Payer: Self-pay | Admitting: Gastroenterology

## 2014-03-21 ENCOUNTER — Ambulatory Visit
Admission: RE | Admit: 2014-03-21 | Discharge: 2014-03-21 | Disposition: A | Discharge status: Home / Self Care | Payer: Medicare Other | Source: Ambulatory Visit | Attending: Cardiothoracic Surgery | Admitting: Cardiothoracic Surgery

## 2014-03-21 DIAGNOSIS — C189 Malignant neoplasm of colon, unspecified: Secondary | ICD-10-CM

## 2014-03-21 LAB — BUN: BUN: 13 mg/dL (ref 6–23)

## 2014-03-21 LAB — CREATININE, SERUM: Creat: 0.81 mg/dL (ref 0.50–1.35)

## 2014-03-21 MED ORDER — IOHEXOL 300 MG/ML  SOLN
100.0000 mL | Freq: Once | INTRAMUSCULAR | Status: AC | PRN
  Administered 2014-03-21: 100 mL via INTRAVENOUS

## 2014-03-21 NOTE — Telephone Encounter (Signed)
All questions answered.  They are advised that the biopsy results have not resulted yet.  They are advised that he will be contacted once they are with the plan of care

## 2014-03-22 ENCOUNTER — Encounter: Payer: Self-pay | Admitting: Gastroenterology

## 2014-03-22 ENCOUNTER — Encounter: Payer: Self-pay | Admitting: *Deleted

## 2014-03-22 ENCOUNTER — Ambulatory Visit (INDEPENDENT_AMBULATORY_CARE_PROVIDER_SITE_OTHER): Payer: Medicare Other | Admitting: Cardiothoracic Surgery

## 2014-03-22 VITALS — BP 132/60 | HR 84 | Temp 98.3°F | Resp 18 | Ht 68.0 in | Wt 124.7 lb

## 2014-03-22 DIAGNOSIS — C20 Malignant neoplasm of rectum: Secondary | ICD-10-CM

## 2014-03-22 DIAGNOSIS — C3410 Malignant neoplasm of upper lobe, unspecified bronchus or lung: Secondary | ICD-10-CM

## 2014-03-22 DIAGNOSIS — N390 Urinary tract infection, site not specified: Secondary | ICD-10-CM

## 2014-03-22 MED ORDER — OXYCODONE HCL 5 MG PO TABS: 5.0000 mg | ORAL_TABLET | Freq: Four times a day (QID) | ORAL | Status: DC | PRN

## 2014-03-22 NOTE — Progress Notes (Signed)
   Thoracic Treatment Summary Name:Finneus L Michaelson Date:03/22/2014 DOB:Sep 07, 1941 Your Medical Team  Radiation Oncologist: Dr. [name] Surgeon: Dr. 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  Pathological stage will be determined after surgery.  Staging is based on the size of the tumor, involvement of lymph nodes or not, and whether or not the cancer center has spread. Recommendations Recommendations: Referral to general surgery  These recommendations are based on information available as of today's consult.  This is subject to change depending further testing or exams. Next Steps Next Step: 1. Appointment with general surgery 03/26/14 at 11:15 2. Follow up with Dr. Servando Snare   Barriers to Care What do you perceive as a potential barrier that may prevent you from receiving your treatment plan? Patient noted a lot of pelvic pain.  Dr. Servando Snare aware and prescribed pain medication.  Resources given [employer] Booklet on lung cancer  Support services at [facility] Questions Norton Blizzard, RN BSN Thoracic Oncology Nurse Navigator at [facility] is a nurse navigator that is available to assist you through your cancer journey.  She can answer your questions and/or provide resources regarding your treatment plan, emotional support, or financial concerns.

## 2014-03-23 ENCOUNTER — Encounter: Payer: Self-pay | Admitting: Cardiothoracic Surgery

## 2014-03-23 NOTE — Progress Notes (Signed)
[address]       [place] [address]             [id number]                         Randy Gould [facility] Record #[id number] Date of Birth: 10-16-1941  Referring: Dr [name]  Primary Care: [name], MD  Chief Complaint:   Right chest wall pain   History of Present Illness:    Randy Gould 73 y.o. male is seen in the office for rt chest wall pain with chest wall and lung mass. The patient was treated for stage T3 N1 rectal cancer at age 71 with LAR followed by chemoradiotherapy complicated by anastomosis breakdown requiring revision multiple surgeries as recently as 2005. This ultimately required colostomy for diversion. More recently, he presented with chest pain on right side. Chest X-Ray and subsequent CT angio showed a 5.7 cm right  upper lobe peripheral mass with likely chest wall invasion. PET scan confirmed hypermetabolisim there without mediastinal lymphadenopathy suggesting T3 N0 disease, with some hypermetabolic pelvic nodes of uncertain significance. CT biopsy of the lung mass shows adenocarcinoma. ALK & EGFR  Negative.  There is a small focus of hypermetabolic nodular pleural thickening in the left upper lobe measuring 7 mm (image 60) with mild metabolic activity (SUV max 2.6).  Patient has remained smoke free since first seen.  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   The patient has completed radiation treatments to the right upper lobe chest wall mass. He notes 95% improvement in chest wall pain. He has had recurrent urinary tract infection, complains of increasing pain in the pelvis, described as bony pain made worse by sitting. Also notes some mild testicular pain. Patient had  Colonoscopy this week .    Because of increasing pelvic pain ct of chest abdomen and pelvis done yesterday     Current Activity/ Functional Status:  Patient is independent with mobility/ambulation, transfers, ADL's,  IADL's.   Zubrod Score: At the time of surgery this patient's most appropriate activity status/level should be described as: []     0    Normal activity, no symptoms [x]     1    Restricted in physical strenuous activity but ambulatory, able to do out light work []     2    Ambulatory and capable of self care, unable to do work activities, up and about               >50 % of waking hours                              []     3    Only limited self care, in bed greater than 50% of waking hours []     4    Completely disabled, no self care, confined to bed or chair []     5    Moribund   Past Medical History  Diagnosis Date  . Cancer of colon 2003  . Lung cancer     Past Surgical History  Procedure Laterality Date  . Nasal septum surgery    . Colon surgery      x3  . Colostomy      Family History  Problem Relation Age of Onset  . Stomach cancer Mother   . Colon cancer Neg  Hx     History   Social History  . Marital Status: Married    Spouse Name: N/A    Number of Children: N/A  . Years of Education: N/A   Occupational History  . Not on file.   Social History Main Topics  . Smoking status: Current Every Day Smoker -- 1.00 packs/day for 30 years    Types: Cigarettes  . Smokeless tobacco: Not on file  . Alcohol Use: Yes     Comment: 1-2 drinks per weel  . Drug Use: No  . Sexual Activity: Not on file           History  Smoking status  . Former Smoker -- 1.00 packs/day for 30 years  . Types: Cigarettes  . Quit date: 01/10/2014  Smokeless tobacco  . Never Used    History  Alcohol Use No    Comment: pt denies     Allergies  Allergen Reactions  . Ativan [Lorazepam] Other (See Comments)    REACTION: Increase heart rate  . Other Itching and Rash    States "head to toe" itching from a "powerful antibiotic" administered years ago.  Cannot recall name of antibiotic.    Current Outpatient Prescriptions  Medication Sig Dispense Refill  . acetaminophen (TYLENOL)  650 MG CR tablet Take 650 mg by mouth every 8 (eight) hours as needed for pain.      Marland Kitchen esomeprazole (NEXIUM) 20 MG capsule Take 20 mg by mouth daily at 12 noon.      Marland Kitchen oxyCODONE (OXY IR/ROXICODONE) 5 MG immediate release tablet Take 1 tablet (5 mg total) by mouth every 6 (six) hours as needed for severe pain.  50 tablet  0  . oxyCODONE (OXY IR/ROXICODONE) 5 MG immediate release tablet Take 1-2 tablets (5-10 mg total) by mouth every 6 (six) hours as needed for severe pain. 1 tablet every 3 hours as needed for pain  120 tablet  0  . oxyCODONE (OXY IR/ROXICODONE) 5 MG immediate release tablet Take 1-2 tablets (5-10 mg total) by mouth every 6 (six) hours as needed for severe pain. 1 tablet every 3 hours as needed for pain  120 tablet  0  . OxyCODONE (OXYCONTIN) 20 mg T12A 12 hr tablet Take 1 tablet (20 mg total) by mouth every 12 (twelve) hours.  60 tablet  0  . ciprofloxacin (CIPRO) 500 MG tablet Take 1 tablet (500 mg total) by mouth 2 (two) times daily. For 14 days  28 tablet  0  . prochlorperazine (COMPAZINE) 10 MG tablet TAKE 1 TABLET (10 MG TOTAL) BY MOUTH EVERY 6 (SIX) HOURS AS NEEDED FOR NAUSEA OR VOMITING.  45 tablet  0   No current facility-administered medications for this visit.     Review of Systems:     Cardiac Review of Systems: Y or N  Chest Pain [  n  ]  Resting SOB [ n  ] Exertional SOB  Blue.Reese  ]  Orthopnea [ y ]   Pedal Edema [  n ]    Palpitations [ n ] Syncope  [n  ]   Presyncope [n   ]  General Review of Systems: [Y] = yes [  ]=no Constitional: recent weight change [ n ];  Wt loss over the last 3 months [   ] anorexia [n  ]; fatigue [ y ]; nausea [n  ]; night sweats [n  ]; fever [  ]; or chillsn [  ];  Dental: poor dentition[  n]; Last Dentist visit:   Eye : blurred vision [ n ]; diplopia [   ]; vision changes [n  ];  Amaurosis fugax[  ]; Resp: cough [  ];  wheezing[ y ];  hemoptysis[ y ]; shortness of breath[y  ]; paroxysmal nocturnal dyspnea[y  ]; dyspnea on exertion[   y]; or orthopnea[  ];  GI:  gallstones[  ], vomiting[  ];  dysphagia[  ]; melena[  ];  hematochezia [  ]; heartburn[  ];   Hx of  Colonoscopy[  ]; GU: kidney stones [n  ]; hematuria[ n ];   dysuria [  ];  nocturia[n  ];  history of     obstruction [  ]; urinary frequency [  ]             Skin: rash, swelling[  ];, hair loss[  ];  peripheral edema[  ];  or itching[  ]; Musculosketetal: myalgias[  ];  joint swelling[  ];  joint erythema[  ];  joint pain[  ];  back pain[  ];  Heme/Lymph: bruising[  ];  bleeding[  ];  anemia[  ];  Neuro: TIA[ n ];  headaches[ n ];  stroke[  ];  vertigo[  ];  seizures[ n ];   paresthesias[  ];  difficulty walking[  ];  Psych:depression[  ]; anxiety[  ];  Endocrine: diabetes[  ];  thyroid dysfunction[  ];  Immunizations: Flu up to date [  n]; Pneumococcal up to date [  n];  Other:  Physical Exam: BP 132/60  Pulse 84  Temp(Src) 98.3 F (36.8 C)  Resp 18  Ht 5' 8"  (1.727 m)  Wt 124 lb 11.2 oz (56.564 kg)  BMI 18.97 kg/m2  SpO2 99%  PHYSICAL EXAMINATION:  General appearance: alert, cooperative, appears older than stated age, cachectic and no distress Neurologic: intact Heart: regular rate and rhythm, S1, S2 normal, no murmur, click, rub or gallop Lungs: diminished breath sounds bilaterally Abdomen: soft, non-tender; bowel sounds normal; no masses,  no organomegaly Extremities: extremities normal, atraumatic, no cyanosis or edema and Homans sign is negative, no sign of DVT no cervical or axillary adenopthy   Diagnostic Studies & Laboratory data:     Recent Radiology Findings: CT chest abdomen pelvis: CLINICAL DATA: Right upper lobe lung cancer. Prior history of  rectal cancer.  EXAM:  CT CHEST, ABDOMEN, AND PELVIS WITH CONTRAST  TECHNIQUE:  Multidetector CT imaging of the chest, abdomen and pelvis was  performed following the standard protocol during bolus  administration of intravenous contrast.  CONTRAST: 138m OMNIPAQUE IOHEXOL 300 MG/ML SOLN    COMPARISON: PET-CT 12/21/2013. Chest CT 12/12/2013.  FINDINGS:  CT CHEST FINDINGS  Mediastinum: Heart size is normal. There is no significant  pericardial fluid, thickening or pericardial calcification. No  pathologically enlarged mediastinal or hilar lymph nodes. Mild  atherosclerosis throughout the thoracic aorta. Esophagus is  unremarkable in appearance.  Lungs/Pleura: When compared to prior examinations, the previously  noted pleural-based mass in the periphery of the right upper lobe  has decreased in size, currently measuring 4.8 x 2.3 cm. 10 x 5 mm  pleural based nodule in the periphery of the left upper lobe (image  10 of series 4) corresponding to small focus of hypermetabolism on  recent PET-CT appears unchanged. There is also a partially calcified  pleural based nodular opacity in the medial aspect of the right  upper lobe measuring 1.2 x 1.0 cm (image 6 of series  4), which is  unchanged, and was not hypermetabolic on the recent PET-CT. No other  new suspicious appearing pulmonary nodules or masses are otherwise  noted. No acute consolidative airspace disease. No pleural  effusions. Moderate centrilobular emphysema, most pronounced  throughout the lung apices.  Musculoskeletal: When compared to prior examinations the osteolytic  changes in the lateral aspect of the right third rib are for more  dramatic. No other aggressive appearing lytic or blastic lesions are  noted in the visualized portions of the skeleton.  CT ABDOMEN AND PELVIS FINDINGS  Abdomen/Pelvis: Several sub cm low-attenuation hepatic lesions are  noted on image 59, 60 and 61 in the left lobe of the liver. These  are nonspecific, but similar to prior study 03/05/2011, and  therefore favored to be benign, likely small cysts. The appearance  of the gallbladder, pancreas, spleen, bilateral adrenal glands and  left kidney is unremarkable. There is mild right  hydroureteronephrosis which extends to the level of  the pelvis  adjacent to the sacrum, presumably secondary to mass effect or  traction and narrowing of the distal third of the right ureter  related to the presacral and perirectal process (see discussion  below). No significant volume of ascites. No pneumoperitoneum. No  pathologic distention of small bowel. Atherosclerotic calcifications  throughout the abdominal and pelvic vasculature, without evidence of  aneurysm.  Status post low anterior resection. There is a left upper quadrant  colostomy, with a moderate parastomal hernia which contains a  portion of the body/antrum of the stomach. Adjacent to the resection  site in the low anatomic pelvis there is a complex soft tissue, gas  and fluid containing mass in the perirectal and presacral region  which measures approximately 5.4 x 7.2 x 8.3 cm. This appears to  involve the rectal remnant, with extension posteriorly, overall  appearing to represent an inflammatory mass with multiple fistulae.  Although, the possibility of residual malignancy with cavitation of  a mass in this region is difficult to exclude.  Musculoskeletal: Areas of osteolysis in the sacrum adjacent to the  previously described presacral process. Two of these at the level of  S2 and S3 have well-defined margins with a sclerotic borders.  IMPRESSION:  1. Positive response to therapy with slight regression of the  previously noted right upper lobe mass. Greater apparent bony  involvement in the lateral aspect of the right third rib may be  related to increasing invasion of the rib, or could be secondary to  evolving changes following recent radiation therapy.  2. Previously described small pleural-based hypermetabolic nodule in  the periphery of the left upper lobe (image 10 of series 4) is  unchanged. Continued attention on followup studies is recommended.  3. Status post low anterior resection for remote history of rectal  cancer with what appears to be an  inflammatory mass in the  perirectal and presacral space is, likely with associated chronic  osteomyelitis. The possibility of residual/recurrent malignancy in  this region is difficult to entirely exclude, but is not strongly  favored; however, correlation with recent biopsy results is  recommended.  4. Left upper quadrant colostomy with moderate-sized parastomal  hernia which contains a short portion of the body/antrum of the  stomach.  5. New mild right hydroureteronephrosis which is presumably related  to either mass effect or traction on the distal third of the right  ureter related to the previously described presacral process.  Urologic consultation may be appropriate if clinically indicated.  6.  Additional incidental findings, as above.  Electronically Signed  By: Vinnie Langton M.D.  On: 03/21/2014 16:49    Mr Jeri Cos QB Contrast  01/09/2014   CLINICAL DATA:  Lung cancer.  Remote history of colon cancer.  EXAM: MRI HEAD WITHOUT AND WITH CONTRAST  TECHNIQUE: Multiplanar, multiecho pulse sequences of the brain and surrounding structures were obtained without and with intravenous contrast.  CONTRAST:  72m MULTIHANCE GADOBENATE DIMEGLUMINE 529 MG/ML IV SOLN  COMPARISON:  CT scan 12/21/2013.  FINDINGS: No acute infarct, hemorrhage, or mass lesion is present. Postcontrast images demonstrate no pathologic enhancement to suggest metastatic disease of the brain or meninges. Midline structures are within normal limits.  Flow is present in the major intracranial arteries. The left lens has been replaced. The globes and orbits are otherwise intact. The a lateral recess of the right sphenoid sinus is opacified. The paranasal sinuses and mastoid air cells are otherwise clear.  IMPRESSION: 1. No evidence for metastatic disease the brain or meninges. 2. MRI appearance of the brain for age 73 Opacification of the lateral recess of the right sphenoid sinus.   Electronically Signed   By: CLawrence Santiago M.D.   On: 01/09/2014 14:42     Ct Angio Chest Pe W/cm &/or Wo Cm  12/12/2013   CLINICAL DATA:  Right upper chest burning sensation or 3 weeks question pulmonary embolism, history colon cancer  EXAM: CT ANGIOGRAPHY CHEST WITH CONTRAST  TECHNIQUE: Multidetector CT imaging of the chest was performed using the standard protocol during bolus administration of intravenous contrast. Multiplanar CT image reconstructions including MIPs were obtained to evaluate the vascular anatomy.  CONTRAST:  860mOMNIPAQUE IOHEXOL 350 MG/ML SOLN  COMPARISON:  CT chest with contrast 03/05/2011  FINDINGS: Atherosclerotic calcifications aorta without aneurysm or dissection.  Pulmonary arteries patent.  No evidence of pulmonary embolism.  No thoracic adenopathy.  Nonspecific 6 mm low-attenuation focus last segment left lobe liver image 98 unchanged.  Remaining visualized portions of upper abdomen normal appearance.  Peripheral mass identified at the lateral aspect of the right upper hemithorax, contiguous with the pleural surface over a broad base, more confluent and mass-like in appearance versus the previous exam.  Finding is worrisome for a Pancoast tumor measuring 5.7 x 2.7 x 4.6 cm  Suspected associated bone destruction of the lateral aspect of the right 3rd rib.  Underlying COPD changes with calcified granuloma at the right upper lobe.  Peripheral areas of scarring in both lungs predominantly upper lobes.  Subsegmental atelectasis right lower lobe.  Remaining lungs clear.  No pleural effusion or pneumothorax.  No additional osseous findings.  Review of the MIP images confirms the above findings.  IMPRESSION: No evidence of pulmonary embolism.  Peripheral tumor with broad pleural base at the lateral aspect of the right upper lobe 5.7 x 2.7 x 4.6 cm in size worrisome for a Pancoast tumor; recommend tissue diagnosis with PET-CT imaging. .  Associated bone destruction of the lateral aspect of the right 3rd rib is suspected.  Severe  underlying COPD and old granulomatous disease.   Electronically Signed   By: MaLavonia Dana.D.   On: 12/12/2013 15:25   Nm Pet Image Initial (pi) Skull Base To Thigh  12/21/2013   CLINICAL DATA:  Initial treatment strategy for lung mass. Prior history of colon cancer.  EXAM: NUCLEAR MEDICINE PET SKULL BASE TO THIGH  FASTING BLOOD GLUCOSE:  Value: 934ml  TECHNIQUE: 19.5 mCi F-18 FDG was injected intravenously. CT data was obtained and  used for attenuation correction and anatomic localization only. (This was not acquired as a diagnostic CT examination.) Additional exam technical data entered on technologist worksheet.  COMPARISON:  CT ANGIO CHEST W/CM &/OR WO/CM dated 12/12/2013; DG CHEST 2 VIEW dated 12/08/2013; CT CHEST W/CM dated 03/05/2011  FINDINGS: NECK  No hypermetabolic lymph nodes in the neck.  CHEST  There is a hypermetabolic mass in the lateral right upper lobe measuring 5.1 x 2.7 cm with intense metabolic activity (SUV max 16.8). This mass shares a broad surface with the chest wall and appears to involve adjacent ribs. There is a small focus of hypermetabolic nodular pleural thickening in the left upper lobe measuring 7 mm (image 60) with mild metabolic activity (SUV max 2.6). There is extensive centrilobular emphysema with upper lobe predominance. No hypermetabolic mediastinal lymph nodes.  ABDOMEN/PELVIS  There is asymmetric hypermetabolic activity associated with the lower rectal region at site of prior surgical resection and surgical clips. Difficult to define the tissue planes at this level due to CT technique and lack of contrast. Hypermetabolic activity is seen on CT image 206 just anterior to the sacrum on the right. Second nodular focus of hypermetabolic activity in the deep pelvis just right of midline on image 194 without clear definable lesion on the CT portion. A thirdfocus of activity is noted medial to the left psoas muscle (image 183. This could represent ureteral activity. There is  physiologic activity noted in the left lower quadrant colostomy. No abnormal metabolic activity within the liver or adrenal glands.  SKELETON  No focal hypermetabolic activity to suggest skeletal metastasis.  IMPRESSION: 1. Hypermetabolic right upper lobe mass the chest wall is concerning for bronchogenic carcinoma. Recommend tissue sampling. 2. Hypermetabolic mild nodular thickening in the left upper lobe is less specific and may be inflammatory. Recommend attention on follow-up. 3. No hypermetabolic mediastinal lymph nodes. 4. Asymmetric hypermetabolic activity in the deep right pelvis adjacent to the sacrum with differential including local recurrence versus potential deep pelvic infection or physiologic activity. Two additional foci of activity in the pelvis could represent local metastasis or physiologic activity. Recommend correlation with CEA and if elevated recommend contrast-enhanced CT of the pelvis for further evaluation.   Electronically Signed   By: Suzy Bouchard M.D.   On: 12/21/2013 16:20   Ct Biopsy  12/27/2013   CLINICAL DATA:  Right lung lesion.  Tissue diagnosis is needed.  EXAM: CT-GUIDED BIOPSY OF RIGHT LUNG LESION  Physician: Stephan Minister. Henn, MD  MEDICATIONS: Versed 2 mg, fentanyl 50 mcg. A radiology nurse monitored the patient for moderate sedation.  ANESTHESIA/SEDATION: Sedation time: 29 min  PROCEDURE: The procedure was explained to the patient. The risks and benefits of the procedure were discussed and the patient's questions were addressed. Informed consent was obtained from the patient. Patient was placed supine on the CT scanner. Images through the chest were obtained. The lesion in the periphery of the right upper lobe was identified. The patient's right axilla was shaved. The right axilla was prepped and draped in sterile fashion. Skin was anesthetized with 1% lidocaine. Using CT guidance, a 17 gauge needle was directed into the peripheral lesion. Needle placement was confirmed  within the lesion. A total of 3 core biopsies were performed with an 18 gauge core device. Two adequate specimens were obtained. Samples were placed in formalin. 17 gauge needle was removed without complication. A small bandage was placed over the puncture site.  COMPLICATIONS: None  FINDINGS: Pleural based lesion in the lateral  right upper lobe. There is mild irregularity of the adjacent right third rib which may be contributing to the patient's symptoms in this area. Patient has diffuse centrilobular emphysema. Needle placement confirmed within the lesion. No evidence for a pneumothorax following the core biopsies.  IMPRESSION: CT-guided core biopsies of the right lung mass.   Electronically Signed   By: Markus Daft M.D.   On: 12/27/2013 13:56    PATH: Diagnosis Lung, needle/core biopsy(ies), Right upper lobe - ADENOCARCINOMA. - SEE COMMENT. Microscopic Comment Dr. Lynnell Chad has reviewed the case and concurs with this interpretation. EGFR and ALK  Both negative   Recent Lab Findings: Lab Results  Component Value Date   WBC 15.5* 02/01/2014   HGB 10.9* 02/01/2014   HCT 32.0* 02/01/2014   PLT 469* 02/01/2014   GLUCOSE 97 02/01/2014   ALT 12 12/08/2013   AST 19 12/08/2013   NA 137 02/01/2014   K 3.9 02/01/2014   CL 95* 02/01/2014   CREATININE 0.81 03/20/2014   BUN 13 03/20/2014   CO2 25 02/01/2014   INR 0.99 12/27/2013   PFT's: FEV1 2.47  84%   DLCO 21.37   72%   Assessment / Plan:   1. 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      2. There is a small focus of hypermetabolic nodular pleural thickening in the left upper lobe       measuring 7 mm (image 60) with mild metabolic activity (SUV max        2.6). This appears unchanged from a CT scan 2012  3. extensive centrilobular emphysema with upper lobe predominance, the patient's pulmonary  function studies are noted above. He has remained off cigarettes   4 Extensive mass in pelvis,/ inflammatory  Vs malignancy causing increasing discomfort, Will have pelvic mass evaluate  before considering high risk lung resection.- to see  General Surgery  Next week, also has been referred  to Urology  Plan to see back  May 7  Grace Isaac MD      [address] [place] [address] Office [phone]   [place]eeper [phone]

## 2014-03-26 ENCOUNTER — Ambulatory Visit (INDEPENDENT_AMBULATORY_CARE_PROVIDER_SITE_OTHER): Payer: Medicare Other | Admitting: Surgery

## 2014-03-26 ENCOUNTER — Encounter (INDEPENDENT_AMBULATORY_CARE_PROVIDER_SITE_OTHER): Payer: Self-pay | Admitting: Surgery

## 2014-03-26 VITALS — BP 125/81 | HR 80 | Temp 98.6°F | Resp 18 | Ht 69.0 in | Wt 124.6 lb

## 2014-03-26 DIAGNOSIS — R1900 Intra-abdominal and pelvic swelling, mass and lump, unspecified site: Secondary | ICD-10-CM

## 2014-03-26 MED ORDER — OXYCODONE HCL 5 MG PO TABS: 5.0000 mg | ORAL_TABLET | Freq: Four times a day (QID) | ORAL | Status: DC | PRN

## 2014-03-26 MED ORDER — SUCRALFATE 1 GM/10ML PO SUSP: 1.0000 g | Freq: Three times a day (TID) | ORAL | Status: DC

## 2014-03-26 NOTE — Progress Notes (Signed)
Patient ID: Randy Gould, male   DOB: [birth year], 73 y.o.   MRN: [id number]  Chief Complaint  Patient presents with  . Mass    HPI Randy Gould is a 73 y.o. male.  Referred by Dr. Lanelle Bal for evaluation of pelvic mass HPI This is a 73 year old male with a very extensive past surgical history. 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 He underwent a low anterior resection with primary anastomosis on 11/10/95 by Dr. Annabell Sabal. He was found to have an anastomotic leak and pelvic abscess and underwent transverse loop colostomy. He had a exploratory laparotomy with lysis of adhesions in August of 1997 for chronic bowel obstruction. In January of 2000 he underwent small bowel resection for bowel obstruction as well as repair of his colostomy.Over the last few weeks the patient has developed increasing pain in his lower pelvis. This is worse when he sits and feels better when he stands. He feels some pressure in his rectum. He underwent colonoscopy last week by Dr. Lucio Edward including his rectum. 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 No biopsies were performed at that time.  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 He has received radiation treatment to the right upper lobe chest wall mass with significant improvement.  His colostomy is functioning well but he does report some prolapse of the mucosa. Past Medical History  Diagnosis Date  . Cancer of colon 2003  . Lung cancer     Past Surgical History  Procedure Laterality Date  . Nasal septum surgery    . Colon surgery      x3  . Colostomy      Family History  Problem Relation Age of Onset  . Stomach cancer Mother   . Colon cancer Neg Hx     Social History History  Substance Use Topics  . Smoking status: Former Smoker -- 1.00 packs/day for 30 years    Types: Cigarettes    Quit date: 01/10/2014  . Smokeless tobacco: Never Used  .  Alcohol Use: No     Comment: pt denies    Allergies  Allergen Reactions  . Ativan [Lorazepam] Other (See Comments)    REACTION: Increase heart rate  . Other Itching and Rash    States "head to toe" itching from a "powerful antibiotic" administered years ago.  Cannot recall name of antibiotic.    Current Outpatient Prescriptions  Medication Sig Dispense Refill  . acetaminophen (TYLENOL) 650 MG CR tablet Take 650 mg by mouth every 8 (eight) hours as needed for pain.      . ciprofloxacin (CIPRO) 500 MG tablet Take 1 tablet (500 mg total) by mouth 2 (two) times daily. For 14 days  28 tablet  0  . esomeprazole (NEXIUM) 20 MG capsule Take 20 mg by mouth daily at 12 noon.      Marland Kitchen MOVIPREP 100 G SOLR       . oxyCODONE (OXY IR/ROXICODONE) 5 MG immediate release tablet Take 1-2 tablets (5-10 mg total) by mouth every 6 (six) hours as needed for severe pain. 1 tablet every 3 hours as needed for pain  120 tablet  0  . oxyCODONE (OXY IR/ROXICODONE) 5 MG immediate release tablet Take 1-2 tablets (5-10 mg total) by mouth every 6 (six) hours as needed for severe pain. 1 tablet every 3 hours as needed for pain  120 tablet  0  . oxyCODONE (OXY IR/ROXICODONE)  5 MG immediate release tablet Take 1 tablet (5 mg total) by mouth every 6 (six) hours as needed for severe pain.  50 tablet  0  . OxyCODONE (OXYCONTIN) 20 mg T12A 12 hr tablet Take 1 tablet (20 mg total) by mouth every 12 (twelve) hours.  60 tablet  0  . prochlorperazine (COMPAZINE) 10 MG tablet TAKE 1 TABLET (10 MG TOTAL) BY MOUTH EVERY 6 (SIX) HOURS AS NEEDED FOR NAUSEA OR VOMITING.  45 tablet  0  . sucralfate (CARAFATE) 1 GM/10ML suspension Place 10 mLs (1 g total) rectally 3 (three) times daily.  420 mL  0   No current facility-administered medications for this visit.    Review of Systems Review of Systems  Constitutional: Negative for fever, chills and unexpected weight change.  HENT: Negative for congestion, hearing loss, sore throat, trouble  swallowing and voice change.   Eyes: Negative for visual disturbance.  Respiratory: Negative for cough and wheezing.   Cardiovascular: Negative for chest pain, palpitations and leg swelling.  Gastrointestinal: Positive for abdominal pain and rectal pain. Negative for nausea, vomiting, diarrhea, constipation, blood in stool and abdominal distention.  Genitourinary: Negative for hematuria and difficulty urinating.  Musculoskeletal: Negative for arthralgias.  Skin: Negative for rash and wound.  Neurological: Negative for seizures, syncope, weakness and headaches.  Hematological: Negative for adenopathy. Does not bruise/bleed easily.  Psychiatric/Behavioral: Negative for confusion.    Blood pressure 125/81, pulse 80, temperature 98.6 F (37 C), temperature source Temporal, resp. rate 18, height 5\' 9"  (1.753 m), weight 124 lb 9.6 oz (56.518 kg).  Physical Exam Physical Exam WDWN in NAD HEENT:  EOMI, sclera anicteric Neck:  No masses, no thyromegaly Lungs:  CTA bilaterally; normal respiratory effort CV:  Regular rate and rhythm; no murmurs Abd:  +bowel sounds, soft, LUQ ostomy with significant prolapse (reducible) and palpable parastomal hernia Rectal:  Stenosis at the anal sphincter with significant tenderness even with passage of my fifth finger Ext:  Well-perfused; no edema Skin:  Warm, dry; no sign of jaundice  Data Reviewed Lab Results  Component Value Date   WBC 15.5* 02/01/2014   HGB 10.9* 02/01/2014   HCT 32.0* 02/01/2014   MCV 80.6 02/01/2014   PLT 469* 02/01/2014   Lab Results  Component Value Date   CREATININE 0.81 03/20/2014   BUN 13 03/20/2014   NA 137 02/01/2014   K 3.9 02/01/2014   CL 95* 02/01/2014   CO2 25 02/01/2014   Ct Chest W Contrast  03/21/2014   CLINICAL DATA:  Right upper lobe lung cancer. Prior history of rectal cancer.  EXAM: CT CHEST, ABDOMEN, AND PELVIS WITH CONTRAST  TECHNIQUE: Multidetector CT imaging of the chest, abdomen and pelvis was performed  following the standard protocol during bolus administration of intravenous contrast.  CONTRAST:  145mL OMNIPAQUE IOHEXOL 300 MG/ML  SOLN  COMPARISON:  PET-CT 12/21/2013.  Chest CT 12/12/2013.  FINDINGS: CT CHEST FINDINGS  Mediastinum: Heart size is normal. There is no significant pericardial fluid, thickening or pericardial calcification. No pathologically enlarged mediastinal or hilar lymph nodes. Mild atherosclerosis throughout the thoracic aorta. Esophagus is unremarkable in appearance.  Lungs/Pleura: When compared to prior examinations, the previously noted pleural-based mass in the periphery of the right upper lobe has decreased in size, currently measuring 4.8 x 2.3 cm. 10 x 5 mm pleural based nodule in the periphery of the left upper lobe (image 10 of series 4) corresponding to small focus of hypermetabolism on recent PET-CT appears unchanged. There is also  a partially calcified pleural based nodular opacity in the medial aspect of the right upper lobe measuring 1.2 x 1.0 cm (image 6 of series 4), which is unchanged, and was not hypermetabolic on the recent PET-CT. No other new suspicious appearing pulmonary nodules or masses are otherwise noted. No acute consolidative airspace disease. No pleural effusions. Moderate centrilobular emphysema, most pronounced throughout the lung apices.  Musculoskeletal: When compared to prior examinations the osteolytic changes in the lateral aspect of the right third rib are for more dramatic. No other aggressive appearing lytic or blastic lesions are noted in the visualized portions of the skeleton.  CT ABDOMEN AND PELVIS FINDINGS  Abdomen/Pelvis: Several sub cm low-attenuation hepatic lesions are noted on image 59, 60 and 61 in the left lobe of the liver. These are nonspecific, but similar to prior study 03/05/2011, and therefore favored to be benign, likely small cysts. The appearance of the gallbladder, pancreas, spleen, bilateral adrenal glands and left kidney is  unremarkable. There is mild right hydroureteronephrosis which extends to the level of the pelvis adjacent to the sacrum, presumably secondary to mass effect or traction and narrowing of the distal third of the right ureter related to the presacral and perirectal process (see discussion below). No significant volume of ascites. No pneumoperitoneum. No pathologic distention of small bowel. Atherosclerotic calcifications throughout the abdominal and pelvic vasculature, without evidence of aneurysm.  Status post low anterior resection. There is a left upper quadrant colostomy, with a moderate parastomal hernia which contains a portion of the body/antrum of the stomach. Adjacent to the resection site in the low anatomic pelvis there is a complex soft tissue, gas and fluid containing mass in the perirectal and presacral region which measures approximately 5.4 x 7.2 x 8.3 cm. This appears to involve the rectal remnant, with extension posteriorly, overall appearing to represent an inflammatory mass with multiple fistulae. Although, the possibility of residual malignancy with cavitation of a mass in this region is difficult to exclude.  Musculoskeletal: Areas of osteolysis in the sacrum adjacent to the previously described presacral process. Two of these at the level of S2 and S3 have well-defined margins with a sclerotic borders.  IMPRESSION: 1. Positive response to therapy with slight regression of the previously noted right upper lobe mass. Greater apparent bony involvement in the lateral aspect of the right third rib may be related to increasing invasion of the rib, or could be secondary to evolving changes following recent radiation therapy. 2. Previously described small pleural-based hypermetabolic nodule in the periphery of the left upper lobe (image 10 of series 4) is unchanged. Continued attention on followup studies is recommended. 3. 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 The possibility of residual/recurrent malignancy in this region is difficult to entirely exclude, but is not strongly favored; however, correlation with recent biopsy results is recommended. 4. Left upper quadrant colostomy with moderate-sized parastomal hernia which contains a short portion of the body/antrum of the stomach. 5. New mild right hydroureteronephrosis which is presumably related to either mass effect or traction on the distal third of the right ureter related to the previously described presacral process. Urologic consultation may be appropriate if clinically indicated. 6. Additional incidental findings, as above.   Electronically Signed   By: Vinnie Langton M.D.   On: 03/21/2014 16:49   Ct Abdomen Pelvis W Contrast  03/21/2014  CLINICAL DATA:  Right upper lobe lung cancer. Prior history of rectal cancer.  EXAM: CT CHEST, ABDOMEN, AND PELVIS WITH CONTRAST  TECHNIQUE: Multidetector CT imaging of the chest, abdomen and pelvis was performed following the standard protocol during bolus administration of intravenous contrast.  CONTRAST:  123mL OMNIPAQUE IOHEXOL 300 MG/ML  SOLN  COMPARISON:  PET-CT 12/21/2013.  Chest CT 12/12/2013.  FINDINGS: CT CHEST FINDINGS  Mediastinum: Heart size is normal. There is no significant pericardial fluid, thickening or pericardial calcification. No pathologically enlarged mediastinal or hilar lymph nodes. Mild atherosclerosis throughout the thoracic aorta. Esophagus is unremarkable in appearance.  Lungs/Pleura: When compared to prior examinations, the previously noted pleural-based mass in the periphery of the right upper lobe has decreased in size, currently measuring 4.8 x 2.3 cm. 10 x 5 mm pleural based nodule in the periphery of the left upper lobe (image 10 of series 4) corresponding to small focus of hypermetabolism on recent PET-CT appears  unchanged. There is also a partially calcified pleural based nodular opacity in the medial aspect of the right upper lobe measuring 1.2 x 1.0 cm (image 6 of series 4), which is unchanged, and was not hypermetabolic on the recent PET-CT. No other new suspicious appearing pulmonary nodules or masses are otherwise noted. No acute consolidative airspace disease. No pleural effusions. Moderate centrilobular emphysema, most pronounced throughout the lung apices.  Musculoskeletal: When compared to prior examinations the osteolytic changes in the lateral aspect of the right third rib are for more dramatic. No other aggressive appearing lytic or blastic lesions are noted in the visualized portions of the skeleton.  CT ABDOMEN AND PELVIS FINDINGS  Abdomen/Pelvis: Several sub cm low-attenuation hepatic lesions are noted on image 59, 60 and 61 in the left lobe of the liver. These are nonspecific, but similar to prior study 03/05/2011, and therefore favored to be benign, likely small cysts. The appearance of the gallbladder, pancreas, spleen, bilateral adrenal glands and left kidney is unremarkable. There is mild right hydroureteronephrosis which extends to the level of the pelvis adjacent to the sacrum, presumably secondary to mass effect or traction and narrowing of the distal third of the right ureter related to the presacral and perirectal process (see discussion below). No significant volume of ascites. No pneumoperitoneum. No pathologic distention of small bowel. Atherosclerotic calcifications throughout the abdominal and pelvic vasculature, without evidence of aneurysm.  Status post low anterior resection. There is a left upper quadrant colostomy, with a moderate parastomal hernia which contains a portion of the body/antrum of the stomach. Adjacent to the resection site in the low anatomic pelvis there is a complex soft tissue, gas and fluid containing mass in the perirectal and presacral region which measures  approximately 5.4 x 7.2 x 8.3 cm. This appears to involve the rectal remnant, with extension posteriorly, overall appearing to represent an inflammatory mass with multiple fistulae. Although, the possibility of residual malignancy with cavitation of a mass in this region is difficult to exclude.  Musculoskeletal: Areas of osteolysis in the sacrum adjacent to the previously described presacral process. Two of these at the level of S2 and S3 have well-defined margins with a sclerotic borders.  IMPRESSION: 1. Positive response to therapy with slight regression of the previously noted right upper lobe mass. Greater apparent bony involvement in the lateral aspect of the right third rib may be related to increasing invasion of the rib, or could be secondary to evolving changes following recent radiation therapy. 2. Previously described small pleural-based hypermetabolic nodule in  the periphery of the left upper lobe (image 10 of series 4) is unchanged. Continued attention on followup studies is recommended. 3. 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 The possibility of residual/recurrent malignancy in this region is difficult to entirely exclude, but is not strongly favored; however, correlation with recent biopsy results is recommended. 4. Left upper quadrant colostomy with moderate-sized parastomal hernia which contains a short portion of the body/antrum of the stomach. 5. New mild right hydroureteronephrosis which is presumably related to either mass effect or traction on the distal third of the right ureter related to the previously described presacral process. Urologic consultation may be appropriate if clinically indicated. 6. Additional incidental findings, as above.   Electronically Signed   By: Vinnie Langton M.D.   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  Rectal stump inflammation with complex inflammatory mass in the pelvis - possible recurrent malignancy vs. Proctitis 2.  Parastomal hernia containing stomach - my examination of his old records shows that this may be a loop colostomy 3.  Colostomy mucosal prolapse     Plan    Too tender for anoscopy and biopsy in the office.  Will likely need examination under anesthesia vs. Percutaneous biopsy of the pelvic mass.  I have asked Dr. Leighton Ruff, our colorectal specialist to evaluate the patient and determine his further management.  He will need probable revision of his colostomy, repair of parastomal hernia, and possible abdominoperineal resection.        Imogene Burn. Alecia Doi 03/26/2014, 2:18 PM

## 2014-03-28 ENCOUNTER — Telehealth: Payer: Self-pay | Admitting: Radiation Oncology

## 2014-03-28 NOTE — Telephone Encounter (Signed)
Place Dr. Simone Curia office note on Dr. Johny Shears desk for review. Dr. [name] referred this patient to Dr. Karsten Ro for severe groin and rectal pain.

## 2014-03-29 ENCOUNTER — Ambulatory Visit (INDEPENDENT_AMBULATORY_CARE_PROVIDER_SITE_OTHER): Payer: Medicare Other | Admitting: General Surgery

## 2014-04-03 ENCOUNTER — Encounter (INDEPENDENT_AMBULATORY_CARE_PROVIDER_SITE_OTHER): Payer: Self-pay | Admitting: General Surgery

## 2014-04-03 ENCOUNTER — Ambulatory Visit (INDEPENDENT_AMBULATORY_CARE_PROVIDER_SITE_OTHER): Payer: Medicare Other | Admitting: General Surgery

## 2014-04-03 ENCOUNTER — Other Ambulatory Visit: Payer: Self-pay | Admitting: *Deleted

## 2014-04-03 VITALS — BP 138/68 | HR 82 | Temp 97.2°F | Resp 16 | Ht 68.0 in | Wt 123.0 lb

## 2014-04-03 DIAGNOSIS — C3490 Malignant neoplasm of unspecified part of unspecified bronchus or lung: Secondary | ICD-10-CM

## 2014-04-03 DIAGNOSIS — K6289 Other specified diseases of anus and rectum: Secondary | ICD-10-CM

## 2014-04-03 MED ORDER — METRONIDAZOLE 500 MG PO TABS: 500.0000 mg | ORAL_TABLET | Freq: Three times a day (TID) | ORAL | Status: AC

## 2014-04-03 MED ORDER — OXYCODONE HCL 5 MG PO TABS: 5.0000 mg | ORAL_TABLET | ORAL | Status: DC | PRN

## 2014-04-03 NOTE — Progress Notes (Signed)
Chief Complaint   Patient presents with   .  Mass   HPI  Randy Gould is a 73 y.o. male. Referred by Dr. Lanelle Bal for evaluation of pelvic mass  HPI  This is a 73 year old male with a very extensive past surgical history. 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 He underwent a low anterior resection with primary anastomosis on 11/10/95 by Dr. Annabell Sabal. He was found to have an anastomotic leak and pelvic abscess and underwent transverse loop colostomy. He had a exploratory laparotomy with lysis of adhesions in August of 1997 for chronic bowel obstruction. In January of 2000 he underwent small bowel resection for bowel obstruction as well as repair of his colostomy.Over the last few weeks the patient has developed increasing pain in his lower pelvis. This is worse when he sits and feels better when he stands. He feels some pressure in his rectum. He underwent colonoscopy last week by Dr. Lucio Edward including his rectum. 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 No biopsies were performed at that time.  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 He has received radiation treatment to the right upper lobe chest wall mass with significant improvement.  His colostomy is functioning well but he does report some prolapse.  Past Medical History   Diagnosis  Date   .  Cancer of colon  2003   .  Lung cancer     Past Surgical History   Procedure  Laterality  Date   .  Nasal septum surgery     .  Colon surgery       x3   .  Colostomy      Family History   Problem  Relation  Age of Onset   .  Stomach cancer  Mother    .  Colon cancer  Neg Hx    Social History  History   Substance Use Topics   .  Smoking status:  Former Smoker -- 1.00 packs/day for 30 years     Types:  Cigarettes     Quit date:  01/10/2014   .  Smokeless tobacco:  Never Used   .  Alcohol Use:  No      Comment: pt denies    Allergies    Allergen  Reactions   .  Ativan [Lorazepam]  Other (See Comments)     REACTION: Increase heart rate   .  Other  Itching and Rash     States "head to toe" itching from a "powerful antibiotic" administered years ago. Cannot recall name of antibiotic.    Current Outpatient Prescriptions   Medication  Sig  Dispense  Refill   .  acetaminophen (TYLENOL) 650 MG CR tablet  Take 650 mg by mouth every 8 (eight) hours as needed for pain.     .  ciprofloxacin (CIPRO) 500 MG tablet  Take 1 tablet (500 mg total) by mouth 2 (two) times daily. For 14 days  28 tablet  0   .  esomeprazole (NEXIUM) 20 MG capsule  Take 20 mg by mouth daily at 12 noon.     Marland Kitchen  MOVIPREP 100 G SOLR      .  oxyCODONE (OXY IR/ROXICODONE) 5 MG immediate release tablet  Take 1-2 tablets (5-10 mg total) by mouth every 6 (six) hours as needed for severe pain. 1 tablet every 3 hours as needed for pain  120 tablet  0   .  oxyCODONE (OXY IR/ROXICODONE) 5 MG immediate release tablet  Take 1-2 tablets (5-10 mg total) by mouth every 6 (six) hours as needed for severe pain. 1 tablet every 3 hours as needed for pain  120 tablet  0   .  oxyCODONE (OXY IR/ROXICODONE) 5 MG immediate release tablet  Take 1 tablet (5 mg total) by mouth every 6 (six) hours as needed for severe pain.  50 tablet  0   .  OxyCODONE (OXYCONTIN) 20 mg T12A 12 hr tablet  Take 1 tablet (20 mg total) by mouth every 12 (twelve) hours.  60 tablet  0   .  prochlorperazine (COMPAZINE) 10 MG tablet  TAKE 1 TABLET (10 MG TOTAL) BY MOUTH EVERY 6 (SIX) HOURS AS NEEDED FOR NAUSEA OR VOMITING.  45 tablet  0   .  sucralfate (CARAFATE) 1 GM/10ML suspension  Place 10 mLs (1 g total) rectally 3 (three) times daily.  420 mL  0    No current facility-administered medications for this visit.   Review of Systems  Review of Systems  Constitutional: Negative for fever, chills and unexpected weight change.  HENT: Negative for congestion, hearing loss, sore throat, trouble swallowing and voice  change.  Eyes: Negative for visual disturbance.  Respiratory: Negative for cough and wheezing.  Cardiovascular: Negative for chest pain, palpitations and leg swelling.  Gastrointestinal: Positive for abdominal pain and rectal pain. Negative for nausea, vomiting, diarrhea, constipation, blood in stool and abdominal distention.  Genitourinary: Negative for hematuria and difficulty urinating.  Musculoskeletal: Negative for arthralgias.  Skin: Negative for rash and wound.  Neurological: Negative for seizures, syncope, weakness and headaches.  Hematological: Negative for adenopathy. Does not bruise/bleed easily.  Psychiatric/Behavioral: Negative for confusion.  Blood pressure 125/81, pulse 80, temperature 98.6 F (37 C), temperature source Temporal, resp. rate 18, height 5\' 9"  (1.753 m), weight 124 lb 9.6 oz (56.518 kg).  Physical Exam  Filed Vitals:   04/03/14 1008  BP: 138/68  Pulse: 82  Temp: 97.2 F (36.2 C)  Resp: 16   Physical Exam  WDWN in NAD  HEENT: EOMI, sclera anicteric  Neck: No masses, no thyromegaly  Lungs: CTA bilaterally; normal respiratory effort  CV: Regular rate and rhythm; no murmurs  Abd: +bowel sounds, soft, LUQ ostomy with significant prolapse (reducible) and palpable parastomal hernia  Rectal: Stenosis at the anal sphincter with significant tenderness even with passage of my fifth finger  Ext: Well-perfused; no edema  Skin: Warm, dry; no sign of jaundice  Data Reviewed  Lab Results   Component  Value  Date    WBC  15.5*  02/01/2014    HGB  10.9*  02/01/2014    HCT  32.0*  02/01/2014    MCV  80.6  02/01/2014    PLT  469*  02/01/2014    Lab Results   Component  Value  Date    CREATININE  0.81  03/20/2014    BUN  13  03/20/2014    NA  137  02/01/2014    K  3.9  02/01/2014    CL  95*  02/01/2014    CO2  25  02/01/2014   Ct Chest W Contrast  03/21/2014 CLINICAL DATA: Right upper lobe lung cancer. Prior history of rectal cancer. EXAM: CT CHEST, ABDOMEN, AND PELVIS  WITH CONTRAST TECHNIQUE: Multidetector CT imaging of the chest, abdomen and pelvis was performed following the standard protocol during bolus administration of intravenous contrast. CONTRAST: 14mL OMNIPAQUE  IOHEXOL 300 MG/ML SOLN COMPARISON: PET-CT 12/21/2013. Chest CT 12/12/2013. FINDINGS: CT CHEST FINDINGS Mediastinum: Heart size is normal. There is no significant pericardial fluid, thickening or pericardial calcification. No pathologically enlarged mediastinal or hilar lymph nodes. Mild atherosclerosis throughout the thoracic aorta. Esophagus is unremarkable in appearance. Lungs/Pleura: When compared to prior examinations, the previously noted pleural-based mass in the periphery of the right upper lobe has decreased in size, currently measuring 4.8 x 2.3 cm. 10 x 5 mm pleural based nodule in the periphery of the left upper lobe (image 10 of series 4) corresponding to small focus of hypermetabolism on recent PET-CT appears unchanged. There is also a partially calcified pleural based nodular opacity in the medial aspect of the right upper lobe measuring 1.2 x 1.0 cm (image 6 of series 4), which is unchanged, and was not hypermetabolic on the recent PET-CT. No other new suspicious appearing pulmonary nodules or masses are otherwise noted. No acute consolidative airspace disease. No pleural effusions. Moderate centrilobular emphysema, most pronounced throughout the lung apices. Musculoskeletal: When compared to prior examinations the osteolytic changes in the lateral aspect of the right third rib are for more dramatic. No other aggressive appearing lytic or blastic lesions are noted in the visualized portions of the skeleton. CT ABDOMEN AND PELVIS FINDINGS Abdomen/Pelvis: Several sub cm low-attenuation hepatic lesions are noted on image 59, 60 and 61 in the left lobe of the liver. These are nonspecific, but similar to prior study 03/05/2011, and therefore favored to be benign, likely small cysts. The appearance of  the gallbladder, pancreas, spleen, bilateral adrenal glands and left kidney is unremarkable. There is mild right hydroureteronephrosis which extends to the level of the pelvis adjacent to the sacrum, presumably secondary to mass effect or traction and narrowing of the distal third of the right ureter related to the presacral and perirectal process (see discussion below). No significant volume of ascites. No pneumoperitoneum. No pathologic distention of small bowel. Atherosclerotic calcifications throughout the abdominal and pelvic vasculature, without evidence of aneurysm. Status post low anterior resection. There is a left upper quadrant colostomy, with a moderate parastomal hernia which contains a portion of the body/antrum of the stomach. Adjacent to the resection site in the low anatomic pelvis there is a complex soft tissue, gas and fluid containing mass in the perirectal and presacral region which measures approximately 5.4 x 7.2 x 8.3 cm. This appears to involve the rectal remnant, with extension posteriorly, overall appearing to represent an inflammatory mass with multiple fistulae. Although, the possibility of residual malignancy with cavitation of a mass in this region is difficult to exclude. Musculoskeletal: Areas of osteolysis in the sacrum adjacent to the previously described presacral process. Two of these at the level of S2 and S3 have well-defined margins with a sclerotic borders. IMPRESSION: 1. Positive response to therapy with slight regression of the previously noted right upper lobe mass. Greater apparent bony involvement in the lateral aspect of the right third rib may be related to increasing invasion of the rib, or could be secondary to evolving changes following recent radiation therapy. 2. Previously described small pleural-based hypermetabolic nodule in the periphery of the left upper lobe (image 10 of series 4) is unchanged. Continued attention on followup studies is recommended. 3.  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 The possibility of residual/recurrent malignancy in this region is difficult  to entirely exclude, but is not strongly favored; however, correlation with recent biopsy results is recommended. 4. Left upper quadrant colostomy with moderate-sized parastomal hernia which contains a short portion of the body/antrum of the stomach. 5. New mild right hydroureteronephrosis which is presumably related to either mass effect or traction on the distal third of the right ureter related to the previously described presacral process. Urologic consultation may be appropriate if clinically indicated. 6. Additional incidental findings, as above. Electronically Signed By: Vinnie Langton M.D. On: 03/21/2014 16:49  Ct Abdomen Pelvis W Contrast  03/21/2014 CLINICAL DATA: Right upper lobe lung cancer. Prior history of rectal cancer. EXAM: CT CHEST, ABDOMEN, AND PELVIS WITH CONTRAST TECHNIQUE: Multidetector CT imaging of the chest, abdomen and pelvis was performed following the standard protocol during bolus administration of intravenous contrast. CONTRAST: 119mL OMNIPAQUE IOHEXOL 300 MG/ML SOLN COMPARISON: PET-CT 12/21/2013. Chest CT 12/12/2013. FINDINGS: CT CHEST FINDINGS Mediastinum: Heart size is normal. There is no significant pericardial fluid, thickening or pericardial calcification. No pathologically enlarged mediastinal or hilar lymph nodes. Mild atherosclerosis throughout the thoracic aorta. Esophagus is unremarkable in appearance. Lungs/Pleura: When compared to prior examinations, the previously noted pleural-based mass in the periphery of the right upper lobe has decreased in size, currently measuring 4.8 x 2.3 cm. 10 x 5 mm pleural based nodule in the periphery of the left upper lobe (image 10 of series 4) corresponding to  small focus of hypermetabolism on recent PET-CT appears unchanged. There is also a partially calcified pleural based nodular opacity in the medial aspect of the right upper lobe measuring 1.2 x 1.0 cm (image 6 of series 4), which is unchanged, and was not hypermetabolic on the recent PET-CT. No other new suspicious appearing pulmonary nodules or masses are otherwise noted. No acute consolidative airspace disease. No pleural effusions. Moderate centrilobular emphysema, most pronounced throughout the lung apices. Musculoskeletal: When compared to prior examinations the osteolytic changes in the lateral aspect of the right third rib are for more dramatic. No other aggressive appearing lytic or blastic lesions are noted in the visualized portions of the skeleton. CT ABDOMEN AND PELVIS FINDINGS Abdomen/Pelvis: Several sub cm low-attenuation hepatic lesions are noted on image 59, 60 and 61 in the left lobe of the liver. These are nonspecific, but similar to prior study 03/05/2011, and therefore favored to be benign, likely small cysts. The appearance of the gallbladder, pancreas, spleen, bilateral adrenal glands and left kidney is unremarkable. There is mild right hydroureteronephrosis which extends to the level of the pelvis adjacent to the sacrum, presumably secondary to mass effect or traction and narrowing of the distal third of the right ureter related to the presacral and perirectal process (see discussion below). No significant volume of ascites. No pneumoperitoneum. No pathologic distention of small bowel. Atherosclerotic calcifications throughout the abdominal and pelvic vasculature, without evidence of aneurysm. Status post low anterior resection. There is a left upper quadrant colostomy, with a moderate parastomal hernia which contains a portion of the body/antrum of the stomach. Adjacent to the resection site in the low anatomic pelvis there is a complex soft tissue, gas and fluid containing mass in the  perirectal and presacral region which measures approximately 5.4 x 7.2 x 8.3 cm. This appears to involve the rectal remnant, with extension posteriorly, overall appearing to represent an inflammatory mass with multiple fistulae. Although, the possibility of residual malignancy with cavitation of a mass in this region is difficult to exclude. Musculoskeletal: Areas of osteolysis in the sacrum adjacent to the  previously described presacral process. Two of these at the level of S2 and S3 have well-defined margins with a sclerotic borders. IMPRESSION: 1. Positive response to therapy with slight regression of the previously noted right upper lobe mass. Greater apparent bony involvement in the lateral aspect of the right third rib may be related to increasing invasion of the rib, or could be secondary to evolving changes following recent radiation therapy. 2. Previously described small pleural-based hypermetabolic nodule in the periphery of the left upper lobe (image 10 of series 4) is unchanged. Continued attention on followup studies is recommended. 3. 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 The possibility of residual/recurrent malignancy in this region is difficult to entirely exclude, but is not strongly favored; however, correlation with recent biopsy results is recommended. 4. Left upper quadrant colostomy with moderate-sized parastomal hernia which contains a short portion of the body/antrum of the stomach. 5. New mild right hydroureteronephrosis which is presumably related to either mass effect or traction on the distal third of the right ureter related to the previously described presacral process. Urologic consultation may be appropriate if clinically indicated. 6. Additional incidental findings, as above. Electronically Signed By: Vinnie Langton  M.D. 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 Rectal stump inflammation with complex inflammatory mass in the pelvis - possible recurrent malignancy vs. Proctitis  2. Parastomal hernia containing stomach - my examination of his old records shows that this may be a loop colostomy  3. Colostomy mucosal prolapse  Plan  Too tender for anoscopy and biopsy in the office.  I will start with an exam under anesthesia and biopsy. 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 I will start him on some Flagyl to control his bacterial load. We will need to rule out a malignant recurrent. After this, we may need to find a way to drain his infection better.  Cont carafate enemas.

## 2014-04-03 NOTE — Patient Instructions (Signed)
Continue Carafate enemas.  We will get your surgery scheduled as soon as possible

## 2014-04-05 ENCOUNTER — Encounter (HOSPITAL_BASED_OUTPATIENT_CLINIC_OR_DEPARTMENT_OTHER): Payer: Self-pay | Admitting: *Deleted

## 2014-04-05 ENCOUNTER — Encounter: Payer: Self-pay | Admitting: Radiation Oncology

## 2014-04-05 ENCOUNTER — Ambulatory Visit
Admission: RE | Admit: 2014-04-05 | Discharge: 2014-04-05 | Disposition: A | Discharge status: Home / Self Care | Payer: Medicare Other | Source: Ambulatory Visit | Attending: Radiation Oncology | Admitting: Radiation Oncology

## 2014-04-05 VITALS — BP 101/62 | HR 96 | Temp 98.1°F | Resp 16 | Wt 122.0 lb

## 2014-04-05 DIAGNOSIS — C3410 Malignant neoplasm of upper lobe, unspecified bronchus or lung: Secondary | ICD-10-CM

## 2014-04-05 NOTE — Progress Notes (Signed)
Patient has been seen by several physician. 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 Post surgical deformity. Another reports reads that the polyp removed during his colonscopy was adenomatous and a repeat colonscopy was recommended for three years out. Reports pain in both his "butt cheeks like a mull kicked him" 4 on a scale of 0-10 despite taking four tylenol and 3 oxycodone. Reports he has ran out of oxycontin. Reports lung operation has been put off until pain is resolved. Reports routine nausea but, denies emesis. Reports taking compazine for nausea. Denies cough, shortness of breath or difficulty swallowing. Denies headache or dizziness.

## 2014-04-05 NOTE — Progress Notes (Signed)
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  Patient has had a busy 6 weeks since completing radiation treatment. 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 He also seen urology regarding chronic urinary tract infections. He is seen general surgery who plans exam under anesthesia to further assess his rectal issues. For all these reasons, his lung cancer surgery is currently on hold.                              ALLERGIES:  is allergic to ativan and other.  Meds: Current Outpatient Prescriptions  Medication Sig Dispense Refill  . acetaminophen (TYLENOL) 650 MG CR tablet Take 650 mg by mouth every 8 (eight) hours as needed for pain.      . ciprofloxacin (CIPRO) 500 MG tablet       . esomeprazole (NEXIUM) 20 MG capsule Take 20 mg by mouth daily at 12 noon.      Marland Kitchen oxyCODONE (OXY IR/ROXICODONE) 5 MG immediate release tablet Take 1-2 tablets (5-10 mg total) by mouth every 4 (four) hours as needed for severe pain.  120 tablet  0  . prochlorperazine (COMPAZINE) 10 MG tablet TAKE 1 TABLET (10 MG TOTAL) BY MOUTH EVERY 6 (SIX) HOURS AS NEEDED FOR NAUSEA OR VOMITING.  45 tablet  0  . sucralfate (CARAFATE) 1 GM/10ML suspension Place 10 mLs (1 g total) rectally 3 (three) times daily.  420 mL  0  . tamsulosin (FLOMAX) 0.4 MG CAPS capsule Take 0.4 mg by mouth daily.       . metroNIDAZOLE (FLAGYL) 500 MG tablet Take 1 tablet (500 mg total) by mouth 3 (three) times daily.  42 tablet  0    No current facility-administered medications for this encounter.    Physical Findings: The patient is in no acute distress. Patient is alert and oriented.  weight is 122 lb (55.339 kg). His oral temperature is 98.1 F (36.7 C). His blood pressure is 101/62 and his pulse is 96. His respiration is 16 and oxygen saturation is 98%. .  No significant changes.  Lab Findings: Lab Results  Component Value Date   WBC 15.5* 02/01/2014   HGB 10.9* 02/01/2014   HCT 32.0* 02/01/2014   MCV 80.6 02/01/2014   PLT 469* 02/01/2014    Radiographic Findings: Ct Chest W Contrast  03/21/2014   CLINICAL DATA:  Right upper lobe lung cancer. Prior history of rectal cancer.  EXAM: CT CHEST, ABDOMEN, AND PELVIS WITH CONTRAST  TECHNIQUE: Multidetector CT imaging of the chest, abdomen and pelvis was performed following the standard protocol during bolus administration of intravenous contrast.  CONTRAST:  166mL OMNIPAQUE IOHEXOL 300 MG/ML  SOLN  COMPARISON:  PET-CT 12/21/2013.  Chest CT 12/12/2013.  FINDINGS: CT CHEST FINDINGS  Mediastinum: Heart size is normal. There is no significant pericardial fluid, thickening or pericardial calcification. No pathologically enlarged mediastinal or hilar lymph nodes. Mild atherosclerosis throughout the  thoracic aorta. Esophagus is unremarkable in appearance.  Lungs/Pleura: When compared to prior examinations, the previously noted pleural-based mass in the periphery of the right upper lobe has decreased in size, currently measuring 4.8 x 2.3 cm. 10 x 5 mm pleural based nodule in the periphery of the left upper lobe (image 10 of series 4) corresponding to small focus of hypermetabolism on recent PET-CT appears unchanged. There is also a partially calcified pleural based nodular opacity in the medial aspect of the right upper lobe measuring 1.2 x 1.0 cm (image 6 of series 4), which is unchanged, and was not hypermetabolic on the recent PET-CT. No other new suspicious appearing pulmonary  nodules or masses are otherwise noted. No acute consolidative airspace disease. No pleural effusions. Moderate centrilobular emphysema, most pronounced throughout the lung apices.  Musculoskeletal: When compared to prior examinations the osteolytic changes in the lateral aspect of the right third rib are for more dramatic. No other aggressive appearing lytic or blastic lesions are noted in the visualized portions of the skeleton.  CT ABDOMEN AND PELVIS FINDINGS  Abdomen/Pelvis: Several sub cm low-attenuation hepatic lesions are noted on image 59, 60 and 61 in the left lobe of the liver. These are nonspecific, but similar to prior study 03/05/2011, and therefore favored to be benign, likely small cysts. The appearance of the gallbladder, pancreas, spleen, bilateral adrenal glands and left kidney is unremarkable. There is mild right hydroureteronephrosis which extends to the level of the pelvis adjacent to the sacrum, presumably secondary to mass effect or traction and narrowing of the distal third of the right ureter related to the presacral and perirectal process (see discussion below). No significant volume of ascites. No pneumoperitoneum. No pathologic distention of small bowel. Atherosclerotic calcifications throughout the abdominal and pelvic vasculature, without evidence of aneurysm.  Status post low anterior resection. There is a left upper quadrant colostomy, with a moderate parastomal hernia which contains a portion of the body/antrum of the stomach. Adjacent to the resection site in the low anatomic pelvis there is a complex soft tissue, gas and fluid containing mass in the perirectal and presacral region which measures approximately 5.4 x 7.2 x 8.3 cm. This appears to involve the rectal remnant, with extension posteriorly, overall appearing to represent an inflammatory mass with multiple fistulae. Although, the possibility of residual malignancy with cavitation of a mass in this region is difficult to  exclude.  Musculoskeletal: Areas of osteolysis in the sacrum adjacent to the previously described presacral process. Two of these at the level of S2 and S3 have well-defined margins with a sclerotic borders.  IMPRESSION: 1. Positive response to therapy with slight regression of the previously noted right upper lobe mass. Greater apparent bony involvement in the lateral aspect of the right third rib may be related to increasing invasion of the rib, or could be secondary to evolving changes following recent radiation therapy. 2. Previously described small pleural-based hypermetabolic nodule in the periphery of the left upper lobe (image 10 of series 4) is unchanged. Continued attention on followup studies is recommended. 3. 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 The possibility of residual/recurrent malignancy in this region is difficult to entirely exclude, but is not strongly favored; however, correlation with recent biopsy results is recommended. 4. Left upper quadrant colostomy with moderate-sized parastomal hernia which contains a short portion of the body/antrum of  the stomach. 5. New mild right hydroureteronephrosis which is presumably related to either mass effect or traction on the distal third of the right ureter related to the previously described presacral process. Urologic consultation may be appropriate if clinically indicated. 6. Additional incidental findings, as above.   Electronically Signed   By: Vinnie Langton M.D.   On: 03/21/2014 16:49   Ct Abdomen Pelvis W Contrast  03/21/2014   CLINICAL DATA:  Right upper lobe lung cancer. Prior history of rectal cancer.  EXAM: CT CHEST, ABDOMEN, AND PELVIS WITH CONTRAST  TECHNIQUE: Multidetector CT imaging of the chest, abdomen and pelvis was performed following the standard protocol during  bolus administration of intravenous contrast.  CONTRAST:  116mL OMNIPAQUE IOHEXOL 300 MG/ML  SOLN  COMPARISON:  PET-CT 12/21/2013.  Chest CT 12/12/2013.  FINDINGS: CT CHEST FINDINGS  Mediastinum: Heart size is normal. There is no significant pericardial fluid, thickening or pericardial calcification. No pathologically enlarged mediastinal or hilar lymph nodes. Mild atherosclerosis throughout the thoracic aorta. Esophagus is unremarkable in appearance.  Lungs/Pleura: When compared to prior examinations, the previously noted pleural-based mass in the periphery of the right upper lobe has decreased in size, currently measuring 4.8 x 2.3 cm. 10 x 5 mm pleural based nodule in the periphery of the left upper lobe (image 10 of series 4) corresponding to small focus of hypermetabolism on recent PET-CT appears unchanged. There is also a partially calcified pleural based nodular opacity in the medial aspect of the right upper lobe measuring 1.2 x 1.0 cm (image 6 of series 4), which is unchanged, and was not hypermetabolic on the recent PET-CT. No other new suspicious appearing pulmonary nodules or masses are otherwise noted. No acute consolidative airspace disease. No pleural effusions. Moderate centrilobular emphysema, most pronounced throughout the lung apices.  Musculoskeletal: When compared to prior examinations the osteolytic changes in the lateral aspect of the right third rib are for more dramatic. No other aggressive appearing lytic or blastic lesions are noted in the visualized portions of the skeleton.  CT ABDOMEN AND PELVIS FINDINGS  Abdomen/Pelvis: Several sub cm low-attenuation hepatic lesions are noted on image 59, 60 and 61 in the left lobe of the liver. These are nonspecific, but similar to prior study 03/05/2011, and therefore favored to be benign, likely small cysts. The appearance of the gallbladder, pancreas, spleen, bilateral adrenal glands and left kidney is unremarkable. There is mild right  hydroureteronephrosis which extends to the level of the pelvis adjacent to the sacrum, presumably secondary to mass effect or traction and narrowing of the distal third of the right ureter related to the presacral and perirectal process (see discussion below). No significant volume of ascites. No pneumoperitoneum. No pathologic distention of small bowel. Atherosclerotic calcifications throughout the abdominal and pelvic vasculature, without evidence of aneurysm.  Status post low anterior resection. There is a left upper quadrant colostomy, with a moderate parastomal hernia which contains a portion of the body/antrum of the stomach. Adjacent to the resection site in the low anatomic pelvis there is a complex soft tissue, gas and fluid containing mass in the perirectal and presacral region which measures approximately 5.4 x 7.2 x 8.3 cm. This appears to involve the rectal remnant, with extension posteriorly, overall appearing to represent an inflammatory mass with multiple fistulae. Although, the possibility of residual malignancy with cavitation of a mass in this region is difficult to exclude.  Musculoskeletal: Areas of osteolysis in the sacrum adjacent to the previously described presacral process. Two of these  at the level of S2 and S3 have well-defined margins with a sclerotic borders.  IMPRESSION: 1. Positive response to therapy with slight regression of the previously noted right upper lobe mass. Greater apparent bony involvement in the lateral aspect of the right third rib may be related to increasing invasion of the rib, or could be secondary to evolving changes following recent radiation therapy. 2. Previously described small pleural-based hypermetabolic nodule in the periphery of the left upper lobe (image 10 of series 4) is unchanged. Continued attention on followup studies is recommended. 3. 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 The possibility of residual/recurrent malignancy in this region is difficult to entirely exclude, but is not strongly favored; however, correlation with recent biopsy results is recommended. 4. Left upper quadrant colostomy with moderate-sized parastomal hernia which contains a short portion of the body/antrum of the stomach. 5. New mild right hydroureteronephrosis which is presumably related to either mass effect or traction on the distal third of the right ureter related to the previously described presacral process. Urologic consultation may be appropriate if clinically indicated. 6. Additional incidental findings, as above.   Electronically Signed   By: Vinnie Langton M.D.   On: 03/21/2014 16:49    Impression:  The patient is recovering from the effects of radiation.  He experienced some regression of the treated lung cancer which may make the tumor amenable to surgical resection if the patient is medically eligible for surgery. However, his medical issues have been dominated by pelvic pain which appears to be multifactorial. Patient had a failed surgical anastomosis of the rectum following surgery and pelvic chemoradiotherapy. 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  Plan:  The patient is to undergo exam under anesthesia with Dr. Marcello Moores. Hopefully, his pelvic issues can be stabilized to permit surgical resection of his T3 N0 lung cancer. Following clearance of his lung cancer, the patient may be eligible for hyperbaric oxygen treatment for his chronic rectal anastomotic failure/radiation proctitis.  _____________________________________  Sheral Apley [name], M.D.

## 2014-04-06 ENCOUNTER — Encounter (HOSPITAL_BASED_OUTPATIENT_CLINIC_OR_DEPARTMENT_OTHER): Payer: Self-pay | Admitting: *Deleted

## 2014-04-06 NOTE — Progress Notes (Signed)
NPO AFTER MN. ARRIVE AT 0600.  NEEDS HG.  CURRENT CHEST CT IN EPIC AND CHART.  WILL TAKE NEXIUM AND OXYCODONE AM DOS W/ SIPS OF WATER.

## 2014-04-08 ENCOUNTER — Encounter (HOSPITAL_BASED_OUTPATIENT_CLINIC_OR_DEPARTMENT_OTHER): Payer: Self-pay | Admitting: Anesthesiology

## 2014-04-08 NOTE — Anesthesia Preprocedure Evaluation (Addendum)
Anesthesia Evaluation  Patient identified by MRN, date of birth, ID band Patient awake    Reviewed: Allergy & Precautions, H&P , NPO status , Patient's Chart, lab work & pertinent test results  Airway Mallampati: II TM Distance: >3 FB Neck ROM: Full    Dental no notable dental hx.    Pulmonary COPDformer smoker,  Lung cancer  breath sounds clear to auscultation  Pulmonary exam normal       Cardiovascular negative cardio ROS  Rhythm:Regular Rate:Normal     Neuro/Psych  Neuromuscular disease negative psych ROS   GI/Hepatic Neg liver ROS, GERD-  Medicated,  Endo/Other  negative endocrine ROS  Renal/GU negative Renal ROS  negative genitourinary   Musculoskeletal negative musculoskeletal ROS (+)   Abdominal   Peds negative pediatric ROS (+)  Hematology negative hematology ROS (+)   Anesthesia Other Findings   Reproductive/Obstetrics negative OB ROS                          Anesthesia Physical Anesthesia Plan  ASA: III  Anesthesia Plan: MAC   Post-op Pain Management:    Induction: Intravenous  Airway Management Planned:   Additional Equipment:   Intra-op Plan:   Post-operative Plan:   Informed Consent: I have reviewed the patients History and Physical, chart, labs and discussed the procedure including the risks, benefits and alternatives for the proposed anesthesia with the patient or authorized representative who has indicated his/her understanding and acceptance.   Dental advisory given  Plan Discussed with: CRNA  Anesthesia Plan Comments:         Anesthesia Quick Evaluation

## 2014-04-09 ENCOUNTER — Ambulatory Visit (HOSPITAL_BASED_OUTPATIENT_CLINIC_OR_DEPARTMENT_OTHER): Payer: Medicare Other | Admitting: Anesthesiology

## 2014-04-09 ENCOUNTER — Encounter (HOSPITAL_BASED_OUTPATIENT_CLINIC_OR_DEPARTMENT_OTHER): Payer: Medicare Other | Admitting: Anesthesiology

## 2014-04-09 ENCOUNTER — Encounter (HOSPITAL_BASED_OUTPATIENT_CLINIC_OR_DEPARTMENT_OTHER)
Admission: RE | Disposition: A | Discharge status: Home / Self Care | Payer: Self-pay | Source: Ambulatory Visit | Attending: General Surgery

## 2014-04-09 ENCOUNTER — Encounter (HOSPITAL_BASED_OUTPATIENT_CLINIC_OR_DEPARTMENT_OTHER): Payer: Self-pay

## 2014-04-09 ENCOUNTER — Ambulatory Visit (HOSPITAL_BASED_OUTPATIENT_CLINIC_OR_DEPARTMENT_OTHER)
Admission: RE | Admit: 2014-04-09 | Discharge: 2014-04-09 | Disposition: A | Discharge status: Home / Self Care | Payer: Medicare Other | Source: Ambulatory Visit | Attending: General Surgery | Admitting: General Surgery

## 2014-04-09 ENCOUNTER — Ambulatory Visit (INDEPENDENT_AMBULATORY_CARE_PROVIDER_SITE_OTHER): Payer: Medicare Other | Admitting: Surgery

## 2014-04-09 DIAGNOSIS — Z933 Colostomy status: Secondary | ICD-10-CM | POA: Insufficient documentation

## 2014-04-09 DIAGNOSIS — Z800 Family history of malignant neoplasm of digestive organs: Secondary | ICD-10-CM | POA: Insufficient documentation

## 2014-04-09 DIAGNOSIS — K624 Stenosis of anus and rectum: Secondary | ICD-10-CM | POA: Insufficient documentation

## 2014-04-09 DIAGNOSIS — Z85118 Personal history of other malignant neoplasm of bronchus and lung: Secondary | ICD-10-CM | POA: Insufficient documentation

## 2014-04-09 DIAGNOSIS — Z79899 Other long term (current) drug therapy: Secondary | ICD-10-CM | POA: Insufficient documentation

## 2014-04-09 DIAGNOSIS — K6289 Other specified diseases of anus and rectum: Secondary | ICD-10-CM

## 2014-04-09 DIAGNOSIS — Y842 Radiological procedure and radiotherapy as the cause of abnormal reaction of the patient, or of later complication, without mention of misadventure at the time of the procedure: Secondary | ICD-10-CM | POA: Insufficient documentation

## 2014-04-09 DIAGNOSIS — Z85048 Personal history of other malignant neoplasm of rectum, rectosigmoid junction, and anus: Secondary | ICD-10-CM | POA: Insufficient documentation

## 2014-04-09 DIAGNOSIS — K603 Anal fistula, unspecified: Secondary | ICD-10-CM | POA: Insufficient documentation

## 2014-04-09 DIAGNOSIS — K219 Gastro-esophageal reflux disease without esophagitis: Secondary | ICD-10-CM | POA: Insufficient documentation

## 2014-04-09 DIAGNOSIS — C3490 Malignant neoplasm of unspecified part of unspecified bronchus or lung: Secondary | ICD-10-CM

## 2014-04-09 DIAGNOSIS — T66XXXA Radiation sickness, unspecified, initial encounter: Secondary | ICD-10-CM

## 2014-04-09 DIAGNOSIS — Z923 Personal history of irradiation: Secondary | ICD-10-CM | POA: Insufficient documentation

## 2014-04-09 DIAGNOSIS — Z87891 Personal history of nicotine dependence: Secondary | ICD-10-CM | POA: Insufficient documentation

## 2014-04-09 DIAGNOSIS — Z888 Allergy status to other drugs, medicaments and biological substances status: Secondary | ICD-10-CM | POA: Insufficient documentation

## 2014-04-09 HISTORY — DX: Presence of dental prosthetic device (complete) (partial): Z97.2

## 2014-04-09 HISTORY — PX: EXAMINATION UNDER ANESTHESIA: SHX1540

## 2014-04-09 HISTORY — DX: Intra-abdominal and pelvic swelling, mass and lump, unspecified site: R19.00

## 2014-04-09 HISTORY — DX: Malignant neoplasm of unspecified part of unspecified bronchus or lung: C34.90

## 2014-04-09 HISTORY — DX: Emphysema, unspecified: J43.9

## 2014-04-09 HISTORY — DX: Flaccid neuropathic bladder, not elsewhere classified: N31.2

## 2014-04-09 HISTORY — DX: Personal history of irradiation: Z92.3

## 2014-04-09 HISTORY — DX: Gastro-esophageal reflux disease without esophagitis: K21.9

## 2014-04-09 HISTORY — DX: Retention of urine, unspecified: R33.9

## 2014-04-09 HISTORY — DX: Presence of spectacles and contact lenses: Z97.3

## 2014-04-09 HISTORY — DX: Colostomy status: Z93.3

## 2014-04-09 HISTORY — DX: Personal history of other malignant neoplasm of rectum, rectosigmoid junction, and anus: Z85.048

## 2014-04-09 HISTORY — DX: Other specified diseases of anus and rectum: K62.89

## 2014-04-09 LAB — POCT HEMOGLOBIN-HEMACUE: Hemoglobin: 9.9 g/dL — ABNORMAL LOW (ref 13.0–17.0)

## 2014-04-09 SURGERY — EXAM UNDER ANESTHESIA
Anesthesia: Monitor Anesthesia Care | Site: Rectum

## 2014-04-09 MED ORDER — PROPOFOL 10 MG/ML IV EMUL
INTRAVENOUS | Status: DC | PRN
  Administered 2014-04-09: 50 ug/kg/min via INTRAVENOUS

## 2014-04-09 MED ORDER — 0.9 % SODIUM CHLORIDE (POUR BTL) OPTIME
TOPICAL | Status: DC | PRN
  Administered 2014-04-09: 500 mL

## 2014-04-09 MED ORDER — FENTANYL CITRATE 0.05 MG/ML IJ SOLN
INTRAMUSCULAR | Status: DC | PRN
  Administered 2014-04-09 (×2): 50 ug via INTRAVENOUS

## 2014-04-09 MED ORDER — LIDOCAINE HCL (CARDIAC) 20 MG/ML IV SOLN
INTRAVENOUS | Status: DC | PRN
  Administered 2014-04-09: 50 mg via INTRAVENOUS

## 2014-04-09 MED ORDER — SUCRALFATE 1 GM/10ML PO SUSP: 1.0000 g | Freq: Three times a day (TID) | ORAL | Status: AC

## 2014-04-09 MED ORDER — LIDOCAINE 5 % EX OINT: 1.0000 "application " | TOPICAL_OINTMENT | CUTANEOUS | Status: DC | PRN

## 2014-04-09 MED ORDER — FENTANYL CITRATE 0.05 MG/ML IJ SOLN
INTRAMUSCULAR | Status: AC
  Filled 2014-04-09: qty 4

## 2014-04-09 MED ORDER — ONDANSETRON HCL 4 MG/2ML IJ SOLN
INTRAMUSCULAR | Status: DC | PRN
  Administered 2014-04-09: 4 mg via INTRAVENOUS

## 2014-04-09 MED ORDER — PROPOFOL 10 MG/ML IV BOLUS
INTRAVENOUS | Status: DC | PRN
Start: 2014-04-09 — End: 2014-04-09
  Administered 2014-04-09: 40 mg via INTRAVENOUS

## 2014-04-09 MED ORDER — LACTATED RINGERS IV SOLN
INTRAVENOUS | Status: DC
  Administered 2014-04-09: 07:00:00 via INTRAVENOUS
  Filled 2014-04-09: qty 1000

## 2014-04-09 MED ORDER — PROMETHAZINE HCL 25 MG/ML IJ SOLN
6.2500 mg | INTRAMUSCULAR | Status: DC | PRN
  Filled 2014-04-09: qty 1

## 2014-04-09 MED ORDER — BUPIVACAINE LIPOSOME 1.3 % IJ SUSP
INTRAMUSCULAR | Status: DC | PRN
  Administered 2014-04-09: 20 mL

## 2014-04-09 MED ORDER — MIDAZOLAM HCL 2 MG/2ML IJ SOLN
INTRAMUSCULAR | Status: AC
  Filled 2014-04-09: qty 2

## 2014-04-09 MED ORDER — FENTANYL CITRATE 0.05 MG/ML IJ SOLN
25.0000 ug | INTRAMUSCULAR | Status: DC | PRN
  Filled 2014-04-09: qty 1

## 2014-04-09 SURGICAL SUPPLY — 47 items
BENZOIN TINCTURE PRP APPL 2/3 (GAUZE/BANDAGES/DRESSINGS) IMPLANT
BLADE HEX COATED 2.75 (ELECTRODE) ×2 IMPLANT
BLADE SURG 10 STRL SS (BLADE) IMPLANT
BLADE SURG 15 STRL LF DISP TIS (BLADE) IMPLANT
BLADE SURG 15 STRL SS (BLADE)
BRIEF STRETCH FOR OB PAD LRG (UNDERPADS AND DIAPERS) ×2 IMPLANT
CANISTER SUCTION 2500CC (MISCELLANEOUS) ×2 IMPLANT
CLOTH BEACON ORANGE TIMEOUT ST (SAFETY) ×2 IMPLANT
COVER MAYO STAND STRL (DRAPES) ×2 IMPLANT
COVER TABLE BACK 60X90 (DRAPES) ×2 IMPLANT
DECANTER SPIKE VIAL GLASS SM (MISCELLANEOUS) IMPLANT
DRAPE LG THREE QUARTER DISP (DRAPES) ×2 IMPLANT
DRAPE PED LAPAROTOMY (DRAPES) IMPLANT
DRAPE UNDERBUTTOCKS STRL (DRAPE) ×2 IMPLANT
DRSG PAD ABDOMINAL 8X10 ST (GAUZE/BANDAGES/DRESSINGS) ×2 IMPLANT
ELECT BLADE 6.5 .24CM SHAFT (ELECTRODE) IMPLANT
ELECT REM PT RETURN 9FT ADLT (ELECTROSURGICAL) ×2
ELECTRODE REM PT RTRN 9FT ADLT (ELECTROSURGICAL) ×1 IMPLANT
GAUZE SPONGE 4X4 16PLY XRAY LF (GAUZE/BANDAGES/DRESSINGS) ×2 IMPLANT
GLOVE BIO SURGEON STRL SZ 6.5 (GLOVE) ×2 IMPLANT
GLOVE INDICATOR 7.0 STRL GRN (GLOVE) ×2 IMPLANT
GOWN STRL REIN XL XLG (GOWN DISPOSABLE) IMPLANT
GOWN STRL REUS W/ TWL LRG LVL3 (GOWN DISPOSABLE) ×1 IMPLANT
GOWN STRL REUS W/TWL 2XL LVL3 (GOWN DISPOSABLE) ×2 IMPLANT
GOWN STRL REUS W/TWL LRG LVL3 (GOWN DISPOSABLE) ×1
HEMOSTAT SURGICEL 2X14 (HEMOSTASIS) IMPLANT
LEGGING LITHOTOMY PAIR STRL (DRAPES) ×2 IMPLANT
LOOP VESSEL MAXI BLUE (MISCELLANEOUS) IMPLANT
NDL SAFETY ECLIPSE 18X1.5 (NEEDLE) IMPLANT
NEEDLE HYPO 18GX1.5 SHARP (NEEDLE)
NEEDLE HYPO 22GX1.5 SAFETY (NEEDLE) ×2 IMPLANT
NS IRRIG 500ML POUR BTL (IV SOLUTION) ×2 IMPLANT
PACK BASIN DAY SURGERY FS (CUSTOM PROCEDURE TRAY) ×2 IMPLANT
PAD ABD 8X10 STRL (GAUZE/BANDAGES/DRESSINGS) ×2 IMPLANT
PENCIL BUTTON HOLSTER BLD 10FT (ELECTRODE) ×2 IMPLANT
SPONGE GAUZE 4X4 12PLY (GAUZE/BANDAGES/DRESSINGS) IMPLANT
SPONGE SURGIFOAM ABS GEL 12-7 (HEMOSTASIS) IMPLANT
SUT CHROMIC 2 0 SH (SUTURE) IMPLANT
SUT CHROMIC 3 0 SH 27 (SUTURE) IMPLANT
SUT ETHIBOND 0 (SUTURE) IMPLANT
SUT VIC AB 2-0 SH 27 (SUTURE)
SUT VIC AB 2-0 SH 27XBRD (SUTURE) IMPLANT
SYR CONTROL 10ML LL (SYRINGE) ×2 IMPLANT
TOWEL OR 17X24 6PK STRL BLUE (TOWEL DISPOSABLE) ×4 IMPLANT
TRAY DSU PREP LF (CUSTOM PROCEDURE TRAY) ×2 IMPLANT
TUBE CONNECTING 12X1/4 (SUCTIONS) ×2 IMPLANT
YANKAUER SUCT BULB TIP NO VENT (SUCTIONS) ×2 IMPLANT

## 2014-04-09 NOTE — Interval H&P Note (Signed)
History and Physical Interval Note:  04/09/2014 7:13 AM  Randy Gould  has presented today for surgery, with the diagnosis of anal pain  The various methods of treatment have been discussed with the patient and family. After consideration of risks, benefits and other options for treatment, the patient has consented to  Procedure(s): EXAM UNDER ANESTHESIA, FLEX SIGMOIDOSCOPYM POSSIBLE BIOPSY (N/A) as a surgical intervention .  The patient's history has been reviewed, patient examined, no change in status, stable for surgery.  I have reviewed the patient's chart and labs.  Questions were answered to the patient's satisfaction.     Leighton Ruff

## 2014-04-09 NOTE — Transfer of Care (Signed)
Immediate Anesthesia Transfer of Care Note  Patient: Randy Gould  Procedure(s) Performed: Procedure(s): EXAM UNDER ANESTHESIA, FLEX SIGMOIDOSCOPYM POSSIBLE BIOPSY (N/A)  Patient Location: PACU  Anesthesia Type:MAC  Level of Consciousness: awake, alert  and oriented  Airway & Oxygen Therapy: Patient Spontanous Breathing and Patient connected to face mask oxygen  Post-op Assessment: Report given to PACU RN  Post vital signs: Reviewed and stable  Complications: No apparent anesthesia complications

## 2014-04-09 NOTE — Anesthesia Postprocedure Evaluation (Signed)
  Anesthesia Post-op Note  Patient: Randy Gould  Procedure(s) Performed: Procedure(s) (LRB): EXAM UNDER ANESTHESIA, FLEX SIGMOIDOSCOPYM POSSIBLE BIOPSY (N/A)  Patient Location: PACU  Anesthesia Type: MAC  Level of Consciousness: awake and alert   Airway and Oxygen Therapy: Patient Spontanous Breathing  Post-op Pain: mild  Post-op Assessment: Post-op Vital signs reviewed, Patient's Cardiovascular Status Stable, Respiratory Function Stable, Patent Airway and No signs of Nausea or vomiting  Last Vitals:  Filed Vitals:   04/09/14 0900  BP: 129/64  Pulse: 82  Temp:   Resp: 17    Post-op Vital Signs: stable   Complications: No apparent anesthesia complications

## 2014-04-09 NOTE — Op Note (Signed)
04/09/2014  8:09 AM  PATIENT:  Randy Gould  73 y.o. male  Patient Care Team: Maxine Glenn. Nolon Rod, MD as PCP - General (Family Medicine) Lora Paula, MD as Consulting Physician (Radiation Oncology) Tanda Rockers, MD as Consulting Physician (Pulmonary Disease)  PRE-OPERATIVE DIAGNOSIS:  anal pain  POST-OPERATIVE DIAGNOSIS:  fistulating radiation proctitis   PROCEDURE:  EXAM UNDER ANESTHESIA, FLEX SIGMOIDOSCOPY  SURGEON:  Surgeon(s): Leighton Ruff, MD  ASSISTANT: none   ANESTHESIA:   local and MAC  SPECIMEN:  No Specimen  DISPOSITION OF SPECIMEN:  N/A  COUNTS:  YES  PLAN OF CARE: Discharge to home after PACU  PATIENT DISPOSITION:  PACU - hemodynamically stable.  INDICATION: This is a 73 y.o. M with anal pain.  He has a h/o rectal cancer and pelvic irradiation.  He is here today for evaluation under anesthesia.     OR FINDINGS: fistula noted posteriorly at level of anastomosis, rectal stenosis distally.  Anal canal intact.    DESCRIPTION: the patient was identified in the preoperative holding area and taken to the OR where they were laid on the operating room table.  MAC anesthesia was induced without difficulty. The patient was then positioned in prone jackknife position with buttocks gently taped apart.  The patient was then prepped and draped in usual sterile fashion.  SCDs were noted to be in place prior to the initiation of anesthesia. A surgical timeout was performed indicating the correct patient, procedure, positioning and need for preoperative antibiotics.  A rectal block was completed using Exparel.    I began with a digital rectal exam. There was some anal stenosis noted.  I then placed a small Hill-Ferguson anoscope into the anal canal and evaluated this completely.  The anal canal appeared normal and without significant inflammation.   There was a small pit noted anteriorly.  The flexible sigmoidoscope was inserted.  The rectum was insufflated and irrigated.   There were no masses noted.  There was a small tract posteriorly that was too small to get the scope into.  The anastomosis appeared friable.  The distal colon above was full of mucus but appeared normal for diverted colon.  At this point we completed the procedure.  All counts were correct per OR staff.  The patient was sent to the PACU in stable condition.

## 2014-04-09 NOTE — H&P (View-Only) (Signed)
Chief Complaint   Patient presents with   .  Mass   HPI  Randy Gould is a 73 y.o. male. Referred by Dr. Lanelle Bal for evaluation of pelvic mass  HPI  This is a 73 year old male with a very extensive past surgical history. 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 He underwent a low anterior resection with primary anastomosis on 11/10/95 by Dr. Annabell Sabal. He was found to have an anastomotic leak and pelvic abscess and underwent transverse loop colostomy. He had a exploratory laparotomy with lysis of adhesions in August of 1997 for chronic bowel obstruction. In January of 2000 he underwent small bowel resection for bowel obstruction as well as repair of his colostomy.Over the last few weeks the patient has developed increasing pain in his lower pelvis. This is worse when he sits and feels better when he stands. He feels some pressure in his rectum. He underwent colonoscopy last week by Dr. Lucio Edward including his rectum. 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 No biopsies were performed at that time.  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 He has received radiation treatment to the right upper lobe chest wall mass with significant improvement.  His colostomy is functioning well but he does report some prolapse.  Past Medical History   Diagnosis  Date   .  Cancer of colon  2003   .  Lung cancer     Past Surgical History   Procedure  Laterality  Date   .  Nasal septum surgery     .  Colon surgery       x3   .  Colostomy      Family History   Problem  Relation  Age of Onset   .  Stomach cancer  Mother    .  Colon cancer  Neg Hx    Social History  History   Substance Use Topics   .  Smoking status:  Former Smoker -- 1.00 packs/day for 30 years     Types:  Cigarettes     Quit date:  01/10/2014   .  Smokeless tobacco:  Never Used   .  Alcohol Use:  No      Comment: pt denies    Allergies    Allergen  Reactions   .  Ativan [Lorazepam]  Other (See Comments)     REACTION: Increase heart rate   .  Other  Itching and Rash     States "head to toe" itching from a "powerful antibiotic" administered years ago. Cannot recall name of antibiotic.    Current Outpatient Prescriptions   Medication  Sig  Dispense  Refill   .  acetaminophen (TYLENOL) 650 MG CR tablet  Take 650 mg by mouth every 8 (eight) hours as needed for pain.     .  ciprofloxacin (CIPRO) 500 MG tablet  Take 1 tablet (500 mg total) by mouth 2 (two) times daily. For 14 days  28 tablet  0   .  esomeprazole (NEXIUM) 20 MG capsule  Take 20 mg by mouth daily at 12 noon.     Marland Kitchen  MOVIPREP 100 G SOLR      .  oxyCODONE (OXY IR/ROXICODONE) 5 MG immediate release tablet  Take 1-2 tablets (5-10 mg total) by mouth every 6 (six) hours as needed for severe pain. 1 tablet every 3 hours as needed for pain  120 tablet  0   .  oxyCODONE (OXY IR/ROXICODONE) 5 MG immediate release tablet  Take 1-2 tablets (5-10 mg total) by mouth every 6 (six) hours as needed for severe pain. 1 tablet every 3 hours as needed for pain  120 tablet  0   .  oxyCODONE (OXY IR/ROXICODONE) 5 MG immediate release tablet  Take 1 tablet (5 mg total) by mouth every 6 (six) hours as needed for severe pain.  50 tablet  0   .  OxyCODONE (OXYCONTIN) 20 mg T12A 12 hr tablet  Take 1 tablet (20 mg total) by mouth every 12 (twelve) hours.  60 tablet  0   .  prochlorperazine (COMPAZINE) 10 MG tablet  TAKE 1 TABLET (10 MG TOTAL) BY MOUTH EVERY 6 (SIX) HOURS AS NEEDED FOR NAUSEA OR VOMITING.  45 tablet  0   .  sucralfate (CARAFATE) 1 GM/10ML suspension  Place 10 mLs (1 g total) rectally 3 (three) times daily.  420 mL  0    No current facility-administered medications for this visit.   Review of Systems  Review of Systems  Constitutional: Negative for fever, chills and unexpected weight change.  HENT: Negative for congestion, hearing loss, sore throat, trouble swallowing and voice  change.  Eyes: Negative for visual disturbance.  Respiratory: Negative for cough and wheezing.  Cardiovascular: Negative for chest pain, palpitations and leg swelling.  Gastrointestinal: Positive for abdominal pain and rectal pain. Negative for nausea, vomiting, diarrhea, constipation, blood in stool and abdominal distention.  Genitourinary: Negative for hematuria and difficulty urinating.  Musculoskeletal: Negative for arthralgias.  Skin: Negative for rash and wound.  Neurological: Negative for seizures, syncope, weakness and headaches.  Hematological: Negative for adenopathy. Does not bruise/bleed easily.  Psychiatric/Behavioral: Negative for confusion.  Blood pressure 125/81, pulse 80, temperature 98.6 F (37 C), temperature source Temporal, resp. rate 18, height 5\' 9"  (1.753 m), weight 124 lb 9.6 oz (56.518 kg).  Physical Exam  Filed Vitals:   04/03/14 1008  BP: 138/68  Pulse: 82  Temp: 97.2 F (36.2 C)  Resp: 16   Physical Exam  WDWN in NAD  HEENT: EOMI, sclera anicteric  Neck: No masses, no thyromegaly  Lungs: CTA bilaterally; normal respiratory effort  CV: Regular rate and rhythm; no murmurs  Abd: +bowel sounds, soft, LUQ ostomy with significant prolapse (reducible) and palpable parastomal hernia  Rectal: Stenosis at the anal sphincter with significant tenderness even with passage of my fifth finger  Ext: Well-perfused; no edema  Skin: Warm, dry; no sign of jaundice  Data Reviewed  Lab Results   Component  Value  Date    WBC  15.5*  02/01/2014    HGB  10.9*  02/01/2014    HCT  32.0*  02/01/2014    MCV  80.6  02/01/2014    PLT  469*  02/01/2014    Lab Results   Component  Value  Date    CREATININE  0.81  03/20/2014    BUN  13  03/20/2014    NA  137  02/01/2014    K  3.9  02/01/2014    CL  95*  02/01/2014    CO2  25  02/01/2014   Ct Chest W Contrast  03/21/2014 CLINICAL DATA: Right upper lobe lung cancer. Prior history of rectal cancer. EXAM: CT CHEST, ABDOMEN, AND PELVIS  WITH CONTRAST TECHNIQUE: Multidetector CT imaging of the chest, abdomen and pelvis was performed following the standard protocol during bolus administration of intravenous contrast. CONTRAST: 160mL OMNIPAQUE  IOHEXOL 300 MG/ML SOLN COMPARISON: PET-CT 12/21/2013. Chest CT 12/12/2013. FINDINGS: CT CHEST FINDINGS Mediastinum: Heart size is normal. There is no significant pericardial fluid, thickening or pericardial calcification. No pathologically enlarged mediastinal or hilar lymph nodes. Mild atherosclerosis throughout the thoracic aorta. Esophagus is unremarkable in appearance. Lungs/Pleura: When compared to prior examinations, the previously noted pleural-based mass in the periphery of the right upper lobe has decreased in size, currently measuring 4.8 x 2.3 cm. 10 x 5 mm pleural based nodule in the periphery of the left upper lobe (image 10 of series 4) corresponding to small focus of hypermetabolism on recent PET-CT appears unchanged. There is also a partially calcified pleural based nodular opacity in the medial aspect of the right upper lobe measuring 1.2 x 1.0 cm (image 6 of series 4), which is unchanged, and was not hypermetabolic on the recent PET-CT. No other new suspicious appearing pulmonary nodules or masses are otherwise noted. No acute consolidative airspace disease. No pleural effusions. Moderate centrilobular emphysema, most pronounced throughout the lung apices. Musculoskeletal: When compared to prior examinations the osteolytic changes in the lateral aspect of the right third rib are for more dramatic. No other aggressive appearing lytic or blastic lesions are noted in the visualized portions of the skeleton. CT ABDOMEN AND PELVIS FINDINGS Abdomen/Pelvis: Several sub cm low-attenuation hepatic lesions are noted on image 59, 60 and 61 in the left lobe of the liver. These are nonspecific, but similar to prior study 03/05/2011, and therefore favored to be benign, likely small cysts. The appearance of  the gallbladder, pancreas, spleen, bilateral adrenal glands and left kidney is unremarkable. There is mild right hydroureteronephrosis which extends to the level of the pelvis adjacent to the sacrum, presumably secondary to mass effect or traction and narrowing of the distal third of the right ureter related to the presacral and perirectal process (see discussion below). No significant volume of ascites. No pneumoperitoneum. No pathologic distention of small bowel. Atherosclerotic calcifications throughout the abdominal and pelvic vasculature, without evidence of aneurysm. Status post low anterior resection. There is a left upper quadrant colostomy, with a moderate parastomal hernia which contains a portion of the body/antrum of the stomach. Adjacent to the resection site in the low anatomic pelvis there is a complex soft tissue, gas and fluid containing mass in the perirectal and presacral region which measures approximately 5.4 x 7.2 x 8.3 cm. This appears to involve the rectal remnant, with extension posteriorly, overall appearing to represent an inflammatory mass with multiple fistulae. Although, the possibility of residual malignancy with cavitation of a mass in this region is difficult to exclude. Musculoskeletal: Areas of osteolysis in the sacrum adjacent to the previously described presacral process. Two of these at the level of S2 and S3 have well-defined margins with a sclerotic borders. IMPRESSION: 1. Positive response to therapy with slight regression of the previously noted right upper lobe mass. Greater apparent bony involvement in the lateral aspect of the right third rib may be related to increasing invasion of the rib, or could be secondary to evolving changes following recent radiation therapy. 2. Previously described small pleural-based hypermetabolic nodule in the periphery of the left upper lobe (image 10 of series 4) is unchanged. Continued attention on followup studies is recommended. 3.  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 The possibility of residual/recurrent malignancy in this region is difficult  to entirely exclude, but is not strongly favored; however, correlation with recent biopsy results is recommended. 4. Left upper quadrant colostomy with moderate-sized parastomal hernia which contains a short portion of the body/antrum of the stomach. 5. New mild right hydroureteronephrosis which is presumably related to either mass effect or traction on the distal third of the right ureter related to the previously described presacral process. Urologic consultation may be appropriate if clinically indicated. 6. Additional incidental findings, as above. Electronically Signed By: Vinnie Langton M.D. On: 03/21/2014 16:49  Ct Abdomen Pelvis W Contrast  03/21/2014 CLINICAL DATA: Right upper lobe lung cancer. Prior history of rectal cancer. EXAM: CT CHEST, ABDOMEN, AND PELVIS WITH CONTRAST TECHNIQUE: Multidetector CT imaging of the chest, abdomen and pelvis was performed following the standard protocol during bolus administration of intravenous contrast. CONTRAST: 17mL OMNIPAQUE IOHEXOL 300 MG/ML SOLN COMPARISON: PET-CT 12/21/2013. Chest CT 12/12/2013. FINDINGS: CT CHEST FINDINGS Mediastinum: Heart size is normal. There is no significant pericardial fluid, thickening or pericardial calcification. No pathologically enlarged mediastinal or hilar lymph nodes. Mild atherosclerosis throughout the thoracic aorta. Esophagus is unremarkable in appearance. Lungs/Pleura: When compared to prior examinations, the previously noted pleural-based mass in the periphery of the right upper lobe has decreased in size, currently measuring 4.8 x 2.3 cm. 10 x 5 mm pleural based nodule in the periphery of the left upper lobe (image 10 of series 4) corresponding to  small focus of hypermetabolism on recent PET-CT appears unchanged. There is also a partially calcified pleural based nodular opacity in the medial aspect of the right upper lobe measuring 1.2 x 1.0 cm (image 6 of series 4), which is unchanged, and was not hypermetabolic on the recent PET-CT. No other new suspicious appearing pulmonary nodules or masses are otherwise noted. No acute consolidative airspace disease. No pleural effusions. Moderate centrilobular emphysema, most pronounced throughout the lung apices. Musculoskeletal: When compared to prior examinations the osteolytic changes in the lateral aspect of the right third rib are for more dramatic. No other aggressive appearing lytic or blastic lesions are noted in the visualized portions of the skeleton. CT ABDOMEN AND PELVIS FINDINGS Abdomen/Pelvis: Several sub cm low-attenuation hepatic lesions are noted on image 59, 60 and 61 in the left lobe of the liver. These are nonspecific, but similar to prior study 03/05/2011, and therefore favored to be benign, likely small cysts. The appearance of the gallbladder, pancreas, spleen, bilateral adrenal glands and left kidney is unremarkable. There is mild right hydroureteronephrosis which extends to the level of the pelvis adjacent to the sacrum, presumably secondary to mass effect or traction and narrowing of the distal third of the right ureter related to the presacral and perirectal process (see discussion below). No significant volume of ascites. No pneumoperitoneum. No pathologic distention of small bowel. Atherosclerotic calcifications throughout the abdominal and pelvic vasculature, without evidence of aneurysm. Status post low anterior resection. There is a left upper quadrant colostomy, with a moderate parastomal hernia which contains a portion of the body/antrum of the stomach. Adjacent to the resection site in the low anatomic pelvis there is a complex soft tissue, gas and fluid containing mass in the  perirectal and presacral region which measures approximately 5.4 x 7.2 x 8.3 cm. This appears to involve the rectal remnant, with extension posteriorly, overall appearing to represent an inflammatory mass with multiple fistulae. Although, the possibility of residual malignancy with cavitation of a mass in this region is difficult to exclude. Musculoskeletal: Areas of osteolysis in the sacrum adjacent to the  previously described presacral process. Two of these at the level of S2 and S3 have well-defined margins with a sclerotic borders. IMPRESSION: 1. Positive response to therapy with slight regression of the previously noted right upper lobe mass. Greater apparent bony involvement in the lateral aspect of the right third rib may be related to increasing invasion of the rib, or could be secondary to evolving changes following recent radiation therapy. 2. Previously described small pleural-based hypermetabolic nodule in the periphery of the left upper lobe (image 10 of series 4) is unchanged. Continued attention on followup studies is recommended. 3. 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 The possibility of residual/recurrent malignancy in this region is difficult to entirely exclude, but is not strongly favored; however, correlation with recent biopsy results is recommended. 4. Left upper quadrant colostomy with moderate-sized parastomal hernia which contains a short portion of the body/antrum of the stomach. 5. New mild right hydroureteronephrosis which is presumably related to either mass effect or traction on the distal third of the right ureter related to the previously described presacral process. Urologic consultation may be appropriate if clinically indicated. 6. Additional incidental findings, as above. Electronically Signed By: Vinnie Langton  M.D. 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 Rectal stump inflammation with complex inflammatory mass in the pelvis - possible recurrent malignancy vs. Proctitis  2. Parastomal hernia containing stomach - my examination of his old records shows that this may be a loop colostomy  3. Colostomy mucosal prolapse  Plan  Too tender for anoscopy and biopsy in the office.  I will start with an exam under anesthesia and biopsy. 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 I will start him on some Flagyl to control his bacterial load. We will need to rule out a malignant recurrent. After this, we may need to find a way to drain his infection better.  Cont carafate enemas.

## 2014-04-09 NOTE — Discharge Instructions (Addendum)
Continue antibiotics and Carafate enemas.  Use lidocaine ointment for external pain.     Beginning the day after surgery:  You may sit in a tub of warm water 2-3 times a day to relieve discomfort.  Eat a regular diet high in fiber.  Avoid foods that give you constipation or diarrhea.  Avoid foods that are difficult to digest, such as seeds, nuts, corn or popcorn.  Do not go any longer than 2 days without a bowel movement.  You may take a dose of Milk of Magnesia if you become constipated.    Drink 6-8 glasses of water daily.  Walking is encouraged.  Avoid strenuous activity and heavy lifting for one month after surgery.    Call the office if you have any questions or concerns.  Call immediately if you develop:   Excessive rectal bleeding (more than a cup or passing large clots)  Increased discomfort  Fever greater than 100 F  Difficulty urinating   Post Anesthesia Home Care Instructions  Activity: Get plenty of rest for the remainder of the day. A responsible adult should stay with you for 24 hours following the procedure.  For the next 24 hours, DO NOT: -Drive a car -Paediatric nurse -Drink alcoholic beverages -Take any medication unless instructed by your physician -Make any legal decisions or sign important papers.  Meals: Start with liquid foods such as gelatin or soup. Progress to regular foods as tolerated. Avoid greasy, spicy, heavy foods. If nausea and/or vomiting occur, drink only clear liquids until the nausea and/or vomiting subsides. Call your physician if vomiting continues.  Special Instructions/Symptoms: Your throat may feel dry or sore from the anesthesia or the breathing tube placed in your throat during surgery. If this causes discomfort, gargle with warm salt water. The discomfort should disappear within 24 hours.   Information for Discharge Teaching: EXPAREL (bupivacaine liposome injectable suspension)   Your surgeon gave you EXPAREL(bupivacaine)  in your surgical incision to help control your pain after surgery.   EXPAREL is a local anesthetic that provides pain relief by numbing the tissue around the surgical site.  EXPAREL is designed to release pain medication over time and can control pain for up to 72 hours.  Depending on how you respond to EXPAREL, you may require less pain medication during your recovery.  Possible side effects:  Temporary loss of sensation or ability to move in the area where bupivacaine was injected.  Nausea, vomiting, constipation  Rarely, numbness and tingling in your mouth or lips, lightheadedness, or anxiety may occur.  Call your doctor right away if you think you may be experiencing any of these sensations, or if you have other questions regarding possible side effects.  Follow all other discharge instructions given to you by your surgeon or nurse. Eat a healthy diet and drink plenty of water or other fluids.  If you return to the hospital for any reason within 96 hours following the administration of EXPAREL, please inform your health care providers.

## 2014-04-10 ENCOUNTER — Encounter (HOSPITAL_BASED_OUTPATIENT_CLINIC_OR_DEPARTMENT_OTHER): Payer: Self-pay | Admitting: General Surgery

## 2014-04-13 ENCOUNTER — Emergency Department (HOSPITAL_COMMUNITY): Payer: Medicare Other

## 2014-04-13 ENCOUNTER — Telehealth: Payer: Self-pay | Admitting: Radiation Oncology

## 2014-04-13 ENCOUNTER — Telehealth: Payer: Self-pay | Admitting: *Deleted

## 2014-04-13 ENCOUNTER — Encounter (HOSPITAL_COMMUNITY): Payer: Self-pay | Admitting: Emergency Medicine

## 2014-04-13 ENCOUNTER — Observation Stay (HOSPITAL_COMMUNITY)
Admission: EM | Admit: 2014-04-13 | Discharge: 2014-04-15 | Disposition: A | Discharge status: Home / Self Care | Payer: Medicare Other | Attending: Internal Medicine | Admitting: Internal Medicine

## 2014-04-13 DIAGNOSIS — K219 Gastro-esophageal reflux disease without esophagitis: Secondary | ICD-10-CM | POA: Insufficient documentation

## 2014-04-13 DIAGNOSIS — R079 Chest pain, unspecified: Secondary | ICD-10-CM

## 2014-04-13 DIAGNOSIS — Z85038 Personal history of other malignant neoplasm of large intestine: Secondary | ICD-10-CM | POA: Insufficient documentation

## 2014-04-13 DIAGNOSIS — Z800 Family history of malignant neoplasm of digestive organs: Secondary | ICD-10-CM | POA: Insufficient documentation

## 2014-04-13 DIAGNOSIS — C3410 Malignant neoplasm of upper lobe, unspecified bronchus or lung: Secondary | ICD-10-CM

## 2014-04-13 DIAGNOSIS — K6289 Other specified diseases of anus and rectum: Secondary | ICD-10-CM | POA: Insufficient documentation

## 2014-04-13 DIAGNOSIS — Z87891 Personal history of nicotine dependence: Secondary | ICD-10-CM | POA: Insufficient documentation

## 2014-04-13 DIAGNOSIS — R1900 Intra-abdominal and pelvic swelling, mass and lump, unspecified site: Secondary | ICD-10-CM

## 2014-04-13 DIAGNOSIS — E876 Hypokalemia: Secondary | ICD-10-CM

## 2014-04-13 DIAGNOSIS — D649 Anemia, unspecified: Secondary | ICD-10-CM

## 2014-04-13 DIAGNOSIS — IMO0001 Reserved for inherently not codable concepts without codable children: Secondary | ICD-10-CM

## 2014-04-13 DIAGNOSIS — Z923 Personal history of irradiation: Secondary | ICD-10-CM | POA: Insufficient documentation

## 2014-04-13 DIAGNOSIS — IMO0002 Reserved for concepts with insufficient information to code with codable children: Secondary | ICD-10-CM

## 2014-04-13 DIAGNOSIS — F17200 Nicotine dependence, unspecified, uncomplicated: Secondary | ICD-10-CM

## 2014-04-13 DIAGNOSIS — Z980 Intestinal bypass and anastomosis status: Secondary | ICD-10-CM | POA: Insufficient documentation

## 2014-04-13 DIAGNOSIS — E43 Unspecified severe protein-calorie malnutrition: Secondary | ICD-10-CM | POA: Insufficient documentation

## 2014-04-13 DIAGNOSIS — E860 Dehydration: Principal | ICD-10-CM

## 2014-04-13 DIAGNOSIS — G893 Neoplasm related pain (acute) (chronic): Secondary | ICD-10-CM

## 2014-04-13 DIAGNOSIS — R627 Adult failure to thrive: Secondary | ICD-10-CM

## 2014-04-13 DIAGNOSIS — Z79899 Other long term (current) drug therapy: Secondary | ICD-10-CM | POA: Insufficient documentation

## 2014-04-13 DIAGNOSIS — C3490 Malignant neoplasm of unspecified part of unspecified bronchus or lung: Secondary | ICD-10-CM

## 2014-04-13 DIAGNOSIS — J438 Other emphysema: Secondary | ICD-10-CM | POA: Insufficient documentation

## 2014-04-13 DIAGNOSIS — Z933 Colostomy status: Secondary | ICD-10-CM | POA: Insufficient documentation

## 2014-04-13 LAB — CBC WITH DIFFERENTIAL/PLATELET
BASOS PCT: 0 % (ref 0–1)
Basophils Absolute: 0.1 10*3/uL (ref 0.0–0.1)
EOS ABS: 0.1 10*3/uL (ref 0.0–0.7)
EOS PCT: 0 % (ref 0–5)
HEMATOCRIT: 23.3 % — AB (ref 39.0–52.0)
Hemoglobin: 8 g/dL — ABNORMAL LOW (ref 13.0–17.0)
Lymphocytes Relative: 7 % — ABNORMAL LOW (ref 12–46)
Lymphs Abs: 1 10*3/uL (ref 0.7–4.0)
MCH: 27.2 pg (ref 26.0–34.0)
MCHC: 34.3 g/dL (ref 30.0–36.0)
MCV: 79.3 fL (ref 78.0–100.0)
MONOS PCT: 6 % (ref 3–12)
Monocytes Absolute: 0.9 10*3/uL (ref 0.1–1.0)
Neutro Abs: 12.6 10*3/uL — ABNORMAL HIGH (ref 1.7–7.7)
Neutrophils Relative %: 87 % — ABNORMAL HIGH (ref 43–77)
Platelets: 776 10*3/uL — ABNORMAL HIGH (ref 150–400)
RBC: 2.94 MIL/uL — AB (ref 4.22–5.81)
RDW: 18.1 % — ABNORMAL HIGH (ref 11.5–15.5)
WBC: 14.6 10*3/uL — ABNORMAL HIGH (ref 4.0–10.5)

## 2014-04-13 LAB — URINALYSIS, ROUTINE W REFLEX MICROSCOPIC
Bilirubin Urine: NEGATIVE
Glucose, UA: NEGATIVE mg/dL
HGB URINE DIPSTICK: NEGATIVE
Ketones, ur: NEGATIVE mg/dL
LEUKOCYTES UA: NEGATIVE
Nitrite: NEGATIVE
PH: 6 (ref 5.0–8.0)
Protein, ur: NEGATIVE mg/dL
Specific Gravity, Urine: 1.013 (ref 1.005–1.030)
Urobilinogen, UA: 0.2 mg/dL (ref 0.0–1.0)

## 2014-04-13 LAB — I-STAT CG4 LACTIC ACID, ED: LACTIC ACID, VENOUS: 3.04 mmol/L — AB (ref 0.5–2.2)

## 2014-04-13 LAB — COMPREHENSIVE METABOLIC PANEL
ALK PHOS: 73 U/L (ref 39–117)
ALT: 9 U/L (ref 0–53)
AST: 15 U/L (ref 0–37)
Albumin: 2.2 g/dL — ABNORMAL LOW (ref 3.5–5.2)
BUN: 10 mg/dL (ref 6–23)
CALCIUM: 8.5 mg/dL (ref 8.4–10.5)
CO2: 26 mEq/L (ref 19–32)
CREATININE: 0.93 mg/dL (ref 0.50–1.35)
Chloride: 93 mEq/L — ABNORMAL LOW (ref 96–112)
GFR calc non Af Amer: 82 mL/min — ABNORMAL LOW (ref >90)
GLUCOSE: 133 mg/dL — AB (ref 70–99)
POTASSIUM: 2.6 meq/L — AB (ref 3.7–5.3)
Sodium: 134 mEq/L — ABNORMAL LOW (ref 137–147)
TOTAL PROTEIN: 7.2 g/dL (ref 6.0–8.3)
Total Bilirubin: <0.2 mg/dL — ABNORMAL LOW (ref 0.3–1.2)

## 2014-04-13 LAB — CREATININE, SERUM
CREATININE: 0.81 mg/dL (ref 0.50–1.35)
GFR calc Af Amer: >90 mL/min (ref >90)
GFR, EST NON AFRICAN AMERICAN: 87 mL/min — AB (ref >90)

## 2014-04-13 LAB — CBC
HCT: 23.6 % — ABNORMAL LOW (ref 39.0–52.0)
Hemoglobin: 7.9 g/dL — ABNORMAL LOW (ref 13.0–17.0)
MCH: 26.4 pg (ref 26.0–34.0)
MCHC: 33.5 g/dL (ref 30.0–36.0)
MCV: 78.9 fL (ref 78.0–100.0)
Platelets: 742 10*3/uL — ABNORMAL HIGH (ref 150–400)
RBC: 2.99 MIL/uL — ABNORMAL LOW (ref 4.22–5.81)
RDW: 18.1 % — AB (ref 11.5–15.5)
WBC: 16.6 10*3/uL — AB (ref 4.0–10.5)

## 2014-04-13 LAB — MAGNESIUM: MAGNESIUM: 1.5 mg/dL (ref 1.5–2.5)

## 2014-04-13 LAB — LIPASE, BLOOD: Lipase: 11 U/L (ref 11–59)

## 2014-04-13 MED ORDER — POTASSIUM CHLORIDE 10 MEQ/100ML IV SOLN
10.0000 meq | INTRAVENOUS | Status: AC
  Administered 2014-04-13 (×5): 10 meq via INTRAVENOUS
  Filled 2014-04-13 (×5): qty 100

## 2014-04-13 MED ORDER — ACETAMINOPHEN 325 MG PO TABS: 650.0000 mg | ORAL_TABLET | Freq: Four times a day (QID) | ORAL | Status: DC | PRN

## 2014-04-13 MED ORDER — HYDROMORPHONE HCL PF 1 MG/ML IJ SOLN
1.0000 mg | INTRAMUSCULAR | Status: DC | PRN
  Administered 2014-04-13: 1 mg via INTRAVENOUS
  Filled 2014-04-13: qty 1

## 2014-04-13 MED ORDER — POTASSIUM CHLORIDE CRYS ER 20 MEQ PO TBCR
40.0000 meq | EXTENDED_RELEASE_TABLET | ORAL | Status: AC
  Administered 2014-04-13 (×2): 40 meq via ORAL
  Filled 2014-04-13 (×3): qty 2

## 2014-04-13 MED ORDER — OXYCODONE HCL 5 MG PO TABS
5.0000 mg | ORAL_TABLET | ORAL | Status: DC | PRN
  Administered 2014-04-13 – 2014-04-14 (×2): 5 mg via ORAL
  Filled 2014-04-13 (×2): qty 1

## 2014-04-13 MED ORDER — POTASSIUM CHLORIDE CRYS ER 20 MEQ PO TBCR
40.0000 meq | EXTENDED_RELEASE_TABLET | Freq: Once | ORAL | Status: AC
  Administered 2014-04-13: 40 meq via ORAL
  Filled 2014-04-13: qty 2

## 2014-04-13 MED ORDER — ONDANSETRON HCL 4 MG/2ML IJ SOLN
4.0000 mg | Freq: Once | INTRAMUSCULAR | Status: AC
  Administered 2014-04-13: 4 mg via INTRAVENOUS
  Filled 2014-04-13: qty 2

## 2014-04-13 MED ORDER — SODIUM CHLORIDE 0.9 % IV SOLN
INTRAVENOUS | Status: DC
  Administered 2014-04-13 – 2014-04-15 (×3): via INTRAVENOUS

## 2014-04-13 MED ORDER — SODIUM CHLORIDE 0.9 % IJ SOLN
3.0000 mL | Freq: Two times a day (BID) | INTRAMUSCULAR | Status: DC
  Administered 2014-04-14: 3 mL via INTRAVENOUS

## 2014-04-13 MED ORDER — POTASSIUM CHLORIDE 10 MEQ/100ML IV SOLN
10.0000 meq | INTRAVENOUS | Status: AC
  Administered 2014-04-13 (×3): 10 meq via INTRAVENOUS
  Filled 2014-04-13 (×3): qty 100

## 2014-04-13 MED ORDER — ACETAMINOPHEN 650 MG RE SUPP: 650.0000 mg | Freq: Four times a day (QID) | RECTAL | Status: DC | PRN

## 2014-04-13 MED ORDER — MORPHINE SULFATE 4 MG/ML IJ SOLN
4.0000 mg | Freq: Once | INTRAMUSCULAR | Status: AC
  Administered 2014-04-13: 4 mg via INTRAVENOUS
  Filled 2014-04-13: qty 1

## 2014-04-13 MED ORDER — ONDANSETRON HCL 4 MG PO TABS: 4.0000 mg | ORAL_TABLET | Freq: Four times a day (QID) | ORAL | Status: DC | PRN

## 2014-04-13 MED ORDER — SODIUM CHLORIDE 0.9 % IV BOLUS (SEPSIS)
1000.0000 mL | Freq: Once | INTRAVENOUS | Status: AC
  Administered 2014-04-13: 1000 mL via INTRAVENOUS

## 2014-04-13 MED ORDER — ADULT MULTIVITAMIN W/MINERALS CH
1.0000 | ORAL_TABLET | Freq: Every day | ORAL | Status: DC
  Filled 2014-04-13: qty 1

## 2014-04-13 MED ORDER — ONDANSETRON HCL 4 MG/2ML IJ SOLN: 4.0000 mg | Freq: Four times a day (QID) | INTRAMUSCULAR | Status: DC | PRN

## 2014-04-13 MED ORDER — ENOXAPARIN SODIUM 40 MG/0.4ML ~~LOC~~ SOLN
40.0000 mg | SUBCUTANEOUS | Status: DC
  Administered 2014-04-13 – 2014-04-14 (×2): 40 mg via SUBCUTANEOUS
  Filled 2014-04-13 (×3): qty 0.4

## 2014-04-13 MED ORDER — PROMETHAZINE HCL 25 MG/ML IJ SOLN
12.5000 mg | Freq: Once | INTRAMUSCULAR | Status: AC
  Administered 2014-04-13: 12.5 mg via INTRAVENOUS
  Filled 2014-04-13: qty 1

## 2014-04-13 MED ORDER — METRONIDAZOLE 500 MG PO TABS
500.0000 mg | ORAL_TABLET | Freq: Three times a day (TID) | ORAL | Status: DC
  Administered 2014-04-13 – 2014-04-15 (×6): 500 mg via ORAL
  Filled 2014-04-13 (×9): qty 1

## 2014-04-13 MED ORDER — SODIUM CHLORIDE 0.9 % IV SOLN
INTRAVENOUS | Status: AC
  Administered 2014-04-13: 15:00:00 via INTRAVENOUS

## 2014-04-13 MED ORDER — SENNOSIDES-DOCUSATE SODIUM 8.6-50 MG PO TABS: 1.0000 | ORAL_TABLET | Freq: Every evening | ORAL | Status: DC | PRN

## 2014-04-13 MED ORDER — ONDANSETRON HCL 4 MG/2ML IJ SOLN: 4.0000 mg | Freq: Three times a day (TID) | INTRAMUSCULAR | Status: DC | PRN

## 2014-04-13 MED ORDER — HYDROMORPHONE HCL PF 1 MG/ML IJ SOLN
1.0000 mg | Freq: Once | INTRAMUSCULAR | Status: AC
  Administered 2014-04-13: 1 mg via INTRAVENOUS
  Filled 2014-04-13: qty 1

## 2014-04-13 NOTE — Telephone Encounter (Signed)
Received notification from [place], RN the patient is "miserable, in a lot of pain, and not eating." Phoned patient. He reports he hasn't eaten or drank anything in six days. Reports he has severe low pelvic pain 10 on a scale of 0-10. Reports a productive cough with white sputum. Reports he has remained in the bed for "the last several days because he is so weak." Reports his bones ache all over. Advised patient to present to the emergency room at [facility] for pain management and hydration. Patient verbalized understanding and confirmed it would take one hour travel time. Phoned Marzetta Board, RN, triage in ED. Informed her patient was in route.

## 2014-04-13 NOTE — Progress Notes (Signed)
INITIAL NUTRITION ASSESSMENT  Pt meets criteria for severe MALNUTRITION in the context of chronic illness as evidenced by <75% estimated energy intake in the past month with 5.6% weight loss in the past month with severe muscle wasting and subcutaneous fat loss in legs and thoracic and lumbar region.  DOCUMENTATION CODES Per approved criteria  -Severe malnutrition in the context of chronic illness -Underweight   INTERVENTION: - Recommend MD discuss appetite stimulant and enteral nutrition with pt as pt with ongoing poor appetite and intake with severe malnutrition - may benefit from palliative care consult  - Encouraged increased meal/snack intake, pt declines wanting any nutritional supplements at this time - Multivitamin 1 tablet PO daily  - Will continue to monitor   NUTRITION DIAGNOSIS: Inadequate oral intake related to chronic nausea, poor appetite as evidenced by pt report.   Goal: Pt to consume >90% of meals  Monitor:  Weights, labs, intake  Reason for Assessment: Malnutrition screening tool   73 y.o. male  Admitting Dx: Nausea, abdominal pain, and tarry stools   ASSESSMENT: C/o extreme nausea, abdominal pain, and tarry stools. Only notices tarry colored stool from LLQ ostomy but not from rectum. Loss of appetite and nausea x6 days. Reports he "hasn't been drinking or eating much the last 6 days." Says he feels dehydrated, weak and dizzy at times when he walks. Hx lung CA and rectal CA. Chemo treatment 10 years ago, radiation treatment ended two weeks ago.  -Met with pt who reports having on/off nausea, mostly on, for the past 10 years.  -States since he had surgery for cancer 10 years ago, if he doesn't have a taste for something he won't eat it. Used example of sometimes he gets a craving for a Yum yum hot dog, and if he gets it, he will eat it, but if he gets sent a steak or lobster or any other kind of food than what he's craving, he either won't eat it, or he will eat it  and it will make him nauseated.  -States he has not had anything to eat or drink in the past 6 days. -Before then he was eating only 1 very small meal/day of various foods.  -Reports weighing 199 pounds 7-8 years ago, then went to 178 pounds, then plateaued to 134 pounds for awhile, now is down to 117 pounds.  -States he cannot drink Ensure because he can't stand the taste and he will throw it back up without warning. Reports his wife has gotten him protein bars and other nutritional supplements but he can't eat them. -States his nausea is improving but his pain is getting worse, also c/o pain in butt cheeks.   Nutrition Focused Physical Exam:  Subcutaneous Fat:  Orbital Region: moderate fat loss  Upper Arm Region:  severe muscle wasting and subcutaneous fat loss  Thoracic and Lumbar Region:  severe muscle wasting and subcutaneous fat loss   Muscle:  Temple Region: moderate fat loss Clavicle Bone Region: moderate fat loss Clavicle and Acromion Bone Region: moderate fat loss Scapular Bone Region: NA Dorsal Hand: moderate fat loss  Patellar Region:  severe muscle wasting and subcutaneous fat loss  Anterior Thigh Region:  severe muscle wasting and subcutaneous fat loss  Posterior Calf Region: severe muscle wasting and subcutaneous fat loss   Edema: None noted  Potassium low, getting IV and oral replacement    Height: Ht Readings from Last 1 Encounters:  04/13/14 5' 9"  (1.753 m)    Weight: Wt Readings from  Last 1 Encounters:  04/13/14 117 lb 11.6 oz (53.4 kg)    Ideal Body Weight: 160 lbs   % Ideal Body Weight: 73%  Wt Readings from Last 10 Encounters:  04/13/14 117 lb 11.6 oz (53.4 kg)  04/09/14 118 lb (53.524 kg)  04/09/14 118 lb (53.524 kg)  04/05/14 122 lb (55.339 kg)  04/03/14 123 lb (55.792 kg)  03/26/14 124 lb 9.6 oz (56.518 kg)  03/22/14 124 lb 11.2 oz (56.564 kg)  03/16/14 122 lb (55.339 kg)  03/15/14 125 lb (56.7 kg)  03/07/14 124 lb 1.6 oz (56.291 kg)     Usual Body Weight: 134 lbs for years per pt  % Usual Body Weight: 87%  BMI:  Body mass index is 17.38 kg/(m^2). Underweight   Estimated Nutritional Needs: Kcal: 1850-2050 Protein: 80-95g Fluid: 1.8-2L/day   Skin: intact   Diet Order: General  EDUCATION NEEDS: -No education needs identified at this time  No intake or output data in the 24 hours ending 04/13/14 1538  Last BM: 5/8  Labs:   Recent Labs Lab 04/13/14 1200  NA 134*  K 2.6*  CL 93*  CO2 26  BUN 10  CREATININE 0.93  CALCIUM 8.5  GLUCOSE 133*    CBG (last 3)  No results found for this basename: GLUCAP,  in the last 72 hours  Scheduled Meds: . sodium chloride   Intravenous STAT  . potassium chloride  10 mEq Intravenous Q1 Hr x 3    Continuous Infusions:   Past Medical History  Diagnosis Date  . Emphysema/COPD   . Rectal pain   . Colostomy in place   . Proctitis   . Hypotonic bladder   . Incomplete emptying of bladder   . History of malignant neoplasm of rectum     1996-- (T3 N1)   S/P  RESECTION WITH COLOSTOMY/   CHEMORADATION THERAPY  . Pelvic mass   . History of radiation therapy     RIGHT UPPER LUNG LOBE--   01-30-2014  to  03-07-2014  (45 gray)  . GERD (gastroesophageal reflux disease)   . Wears glasses   . Wears dentures   . Adenocarcinoma of lung     RIGHT UPPER LOBE (stage IIB , T2  N0, M0)--  DX JAN 2015  S/P  RADIATION TX  (COMPLETE  03-07-2014)    Past Surgical History  Procedure Laterality Date  . Nasal septum surgery  1972  . Exploratory laparotomy /  lysis adhesions  AUG  1997    CHRONIC BOWEL OBSTRUCTION  . Small bowel resection and repair colostomy  JAN  2000  . Colonoscopy w/ polypectomy  03-16-2014  . Low anterior resection with primary anastomosis/  trnasverse loop colostomy with division  11-10-1995  . Examination under anesthesia N/A 04/09/2014    Procedure: EXAM UNDER ANESTHESIA, FLEX SIGMOIDOSCOPYM POSSIBLE BIOPSY;  Surgeon: Leighton Ruff, MD;  Location:  [facility];  Service: General;  Laterality: N/A;    Mikey College MS, [place], [place] Pager [phone] After Hours Pager

## 2014-04-13 NOTE — ED Provider Notes (Signed)
CSN: [id number]     Arrival date & time 04/13/14  1116 History   First MD Initiated Contact with Patient 04/13/14 1122     Chief Complaint  Patient presents with  . Melena     (Consider location/radiation/quality/duration/timing/severity/associated sxs/prior Treatment) The history is provided by the patient.  Randy Gould is a 73 y.o. male hx of COPD, rectal cancer and lung ca s/p radiation 2 weeks ago here with weakness, dehydration, nausea, and melena. The last 6-7 days he's been having diffuse weakness and nausea and decreased food intake. He tried taking some Compazine without any benefit. He had some black stools from his colostomy bag. He was also diagnosed with urinary tract infection and has been on cipro. 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 Denies any fevers or chills. He has cancer of the right ribs now he feels it more. Denies any shortness of breath. Had a history of failure to thrive in the past.    Past Medical History  Diagnosis Date  . Emphysema/COPD   . Rectal pain   . Colostomy in place   . Proctitis   . Hypotonic bladder   . Incomplete emptying of bladder   . History of malignant neoplasm of rectum     1996-- (T3 N1)   S/P  RESECTION WITH COLOSTOMY/   CHEMORADATION THERAPY  . Pelvic mass   . History of radiation therapy     RIGHT UPPER LUNG LOBE--   01-30-2014  to  03-07-2014  (45 gray)  . GERD (gastroesophageal reflux disease)   . Wears glasses   . Wears dentures   . Adenocarcinoma of lung     RIGHT UPPER LOBE (stage IIB , T2  N0, M0)--  DX JAN 2015  S/P  RADIATION TX  (COMPLETE  03-07-2014)   Past Surgical History  Procedure Laterality Date  . Nasal septum surgery  1972  . Exploratory laparotomy /  lysis adhesions  AUG  1997    CHRONIC BOWEL OBSTRUCTION  . Small bowel resection and repair colostomy  JAN  2000  . Colonoscopy w/ polypectomy  03-16-2014  . Low anterior resection with primary  anastomosis/  trnasverse loop colostomy with division  11-10-1995  . Examination under anesthesia N/A 04/09/2014    Procedure: EXAM UNDER ANESTHESIA, FLEX SIGMOIDOSCOPYM POSSIBLE BIOPSY;  Surgeon: Leighton Ruff, MD;  Location: [facility];  Service: General;  Laterality: N/A;   Family History  Problem Relation Age of Onset  . Stomach cancer Mother   . Colon cancer Neg Hx    History  Substance Use Topics  . Smoking status: Former Smoker -- 1.00 packs/day for 30 years    Types: Cigarettes    Quit date: 01/10/2014  . Smokeless tobacco: Never Used  . Alcohol Use: No     Comment: pt denies    Review of Systems  Gastrointestinal: Positive for nausea and abdominal pain.  All other systems reviewed and are negative.     Allergies  Ativan and Other  Home Medications   Prior to Admission medications   Medication Sig Start Date End Date Taking? Authorizing Provider  acetaminophen (TYLENOL) 650 MG CR tablet Take 650 mg by mouth every 8 (eight) hours as needed for pain.    Historical Provider, MD  ciprofloxacin (CIPRO) 500 MG tablet  03/02/14   Historical Provider, MD  esomeprazole (NEXIUM) 20 MG capsule Take 20 mg by mouth as needed.  Historical Provider, MD  lidocaine (XYLOCAINE) 5 % ointment Apply 1 application topically as needed. 5/0/93   Leighton Ruff, MD  metroNIDAZOLE (FLAGYL) 500 MG tablet Take 1 tablet (500 mg total) by mouth 3 (three) times daily. 2/67/12 4/58/09  Leighton Ruff, MD  oxyCODONE (OXY IR/ROXICODONE) 5 MG immediate release tablet Take 1-2 tablets (5-10 mg total) by mouth every 4 (four) hours as needed for severe pain. 04/03/14   Melrose Nakayama, MD  prochlorperazine (COMPAZINE) 10 MG tablet TAKE 1 TABLET (10 MG TOTAL) BY MOUTH EVERY 6 (SIX) HOURS AS NEEDED FOR NAUSEA OR VOMITING.    Lora Paula, MD  sucralfate (CARAFATE) 1 GM/10ML suspension Place 10 mLs (1 g total) rectally 3 (three) times daily. 08/14/32   Leighton Ruff, MD  tamsulosin  (FLOMAX) 0.4 MG CAPS capsule Take 0.4 mg by mouth daily.  03/27/14   Historical Provider, MD   BP 113/56  Pulse 86  Temp(Src) 98.3 F (36.8 C) (Oral)  Resp 12  SpO2 100% Physical Exam  Nursing note and vitals reviewed. Constitutional: He is oriented to person, place, and time.  Emaciated, dehydrated   HENT:  Head: Normocephalic.  MM dry   Eyes: Conjunctivae are normal. Pupils are equal, round, and reactive to light.  Neck: Normal range of motion. Neck supple.  Cardiovascular: Normal rate, regular rhythm and normal heart sounds.   Pulmonary/Chest: Effort normal and breath sounds normal. No respiratory distress. He has no wheezes. He has no rales.  Abdominal: Soft. Bowel sounds are normal.  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   Musculoskeletal: Normal range of motion. He exhibits no edema and no tenderness.  Neurological: He is alert and oriented to person, place, and time. No cranial nerve deficit. Coordination normal.  Skin: Skin is warm and dry.  Psychiatric: He has a normal mood and affect. His behavior is normal. Judgment and thought content normal.    ED Course  Procedures (including critical care time) Labs Review Labs Reviewed  CBC WITH DIFFERENTIAL - Abnormal; Notable for the following:    WBC 14.6 (*)    RBC 2.94 (*)    Hemoglobin 8.0 (*)    HCT 23.3 (*)    RDW 18.1 (*)    Platelets 776 (*)    Neutrophils Relative % 87 (*)    Neutro Abs 12.6 (*)    Lymphocytes Relative 7 (*)    All other components within normal limits  COMPREHENSIVE METABOLIC PANEL - Abnormal; Notable for the following:    Sodium 134 (*)    Potassium 2.6 (*)    Chloride 93 (*)    Glucose, Bld 133 (*)    Albumin 2.2 (*)    Total Bilirubin <0.2 (*)    GFR calc non Af Amer 82 (*)    All other components within normal limits  I-STAT CG4 LACTIC ACID, ED - Abnormal; Notable for the following:     Lactic Acid, Venous 3.04 (*)    All other components within normal limits  URINE CULTURE  URINALYSIS, ROUTINE W REFLEX MICROSCOPIC  LIPASE, BLOOD  POC OCCULT BLOOD, ED    Imaging Review Dg Abd Acute W/chest  04/13/2014   CLINICAL DATA:  Abdominal pain and nausea.  EXAM: ACUTE ABDOMEN SERIES (ABDOMEN 2 VIEW & CHEST 1 VIEW)  COMPARISON:  CT chest, abdomen and pelvis 03/21/2014. Chest radiograph 12/27/2013.  FINDINGS: The cardiomediastinal silhouette is within normal limits. The lungs  remain hyperinflated. Peripheral right upper lobe mass is again seen and better evaluated on recent chest CT. Interstitial markings are mildly prominent bilaterally, similar to the prior study. There is no evidence of new airspace consolidation, overt edema, pleural effusion, or pneumothorax.  There is no evidence of intraperitoneal free air. No significant air-fluid levels are identified. Gas is present in nondilated colon. There is a left sided colostomy. There is a paucity of small bowel gas. Surgical clips are present in the pelvis. No acute osseous abnormality is identified.  IMPRESSION: 1. Peripheral right upper lobe mass. No evidence of acute airspace disease. 2. Nonspecific bowel gas pattern with paucity of small bowel gas.   Electronically Signed   By: Logan Bores   On: 04/13/2014 12:37     EKG Interpretation None      Date: 04/13/2014  Rate: 69  Rhythm: normal sinus rhythm  QRS Axis: normal  Intervals: normal  ST/T Wave abnormalities: LVH with anteiro Q waves, nonspecific changes   Conduction Disutrbances:none  Narrative Interpretation:   Old EKG Reviewed: unchanged    MDM   Final diagnoses:  None    Randy Gould is a 73 y.o. male here with weakness, nausea, ab pain, melena. Occ neg currently. Likely failure to thrive vs sepsis vs SBO (given surgical history). Will get labs, hydrate patient, get xrays.   1:22 PM Lactate 3. No obvious pneumonia or UTI. K 2.6, supplemented. Nonspecific EKG  changes. Hg 8.0, dropped from 10 4 days ago. Occ neg in the ED and brown stool. Anemia can be from previous melena vs cancer. Will admit to tele for hypokalemia, failure to thrive, anemia.    Wandra Arthurs, MD 04/13/14 1325

## 2014-04-13 NOTE — ED Notes (Signed)
POC occult blood: NEGATIVE Confirmed by Darl Householder, MD

## 2014-04-13 NOTE — Telephone Encounter (Signed)
Received call this am from a pt of Dr Jana Hakim who states she is calling on behalf of a friend  She reports that Randy Gould is in dire straights & his wife does not know where to turn for advice.  She states that he is a lung ca pt & former colon ca pt & his meds are not helping him & he is in a lot of pain & wife is convinced that he is not going to survive much longer.   Returned call to wife & she states that pt is not taking oxycontin due to expense & "it wasn't helping anyway"  But is taking oxycodone.  She reports that he is not eating or drinking b/c everything taste bad & he has lost a lot of weight.  He is having rectal pain & is using carafate rectally & this really hurts to use.  She states that she has contacted every doctor involved & asked to be admitted but no one will admit him.  She thinks he is getting dehydrated & also thinks he needs pain control.  Informed that I would talk with Dr. Johny Shears office & ask them to contact her & also suggested that she call if she doesn't hear anything & to also contact his surgeon & if he needs to, go to ED for help.  Talked with Gaspar Garbe in [place] she will relay message to Dr. Johny Shears nurse.

## 2014-04-13 NOTE — ED Notes (Signed)
Patient is aware that a urine sample is needed. Urinal is at bedside.

## 2014-04-13 NOTE — Telephone Encounter (Signed)
Call from [facility] med/onc ,this is Dr.Manning's patient, will give information to [facility] Pressnell<RN to call the patient back,pain,depresed, not eating 10:01 AM

## 2014-04-13 NOTE — ED Notes (Addendum)
Per patient- A&O x4. C/o extreme nausea, abdominal pain, and tarry stools. Only notices tarry colored stool from LLQ ostomy but not from rectum. Loss of appetite and nausea x6 days. Reports he "hasn't been drinking or eating much the last 6 days." Says he feels dehydrated, weak and dizzy at times when he walks. Hx lung CA and colon CA. Chemo treatment 10 years ago, radiation treatment ended two weeks ago. No other complaints at this time. MD in to see patient and explain plan of care

## 2014-04-13 NOTE — H&P (Signed)
[facility]          History and Physical    PCP:   [name], MD   Chief Complaint:  Weakness, anorexia, sacral pain  HPI: Patient is a very pleasant 73 year old man with a diagnosis of lung cancer who has recently completed radiation therapy, prior history of colon cancer status post colostomy who is on Flagyl for rectal proctitis recently diagnosed. He presents to the hospital today with the above-mentioned complaints. He has not had any appetite and has had no food or fluid intake in at least 6 days. He has become progressively weaker to the point where his wife is having difficulty managing him at home. In the emergency department he is found to have a potassium of 2.6, found to be severely dehydrated, hospitalist admission has been requested.  Allergies:   Allergies  Allergen Reactions  . Ativan [Lorazepam] Other (See Comments)    REACTION: Increase heart rate  . Other Itching and Rash    States "head to toe" itching from a "powerful antibiotic" administered years ago.  Cannot recall name of antibiotic.      Past Medical History  Diagnosis Date  . Emphysema/COPD   . Rectal pain   . Colostomy in place   . Proctitis   . Hypotonic bladder   . Incomplete emptying of bladder   . History of malignant neoplasm of rectum     1996-- (T3 N1)   S/P  RESECTION WITH COLOSTOMY/   CHEMORADATION THERAPY  . Pelvic mass   . History of radiation therapy     RIGHT UPPER LUNG LOBE--   01-30-2014  to  03-07-2014  (45 gray)  . GERD (gastroesophageal reflux disease)   . Wears glasses   . Wears dentures   . Adenocarcinoma of lung     RIGHT UPPER LOBE (stage IIB , T2  N0, M0)--  DX JAN 2015  S/P  RADIATION TX  (COMPLETE  03-07-2014)    Past Surgical History  Procedure Laterality Date  . Nasal septum surgery  1972  . Exploratory laparotomy /  lysis adhesions  AUG  1997    CHRONIC BOWEL OBSTRUCTION  . Small bowel resection and repair colostomy  JAN  2000  .  Colonoscopy w/ polypectomy  03-16-2014  . Low anterior resection with primary anastomosis/  trnasverse loop colostomy with division  11-10-1995  . Examination under anesthesia N/A 04/09/2014    Procedure: EXAM UNDER ANESTHESIA, FLEX SIGMOIDOSCOPYM POSSIBLE BIOPSY;  Surgeon: Leighton Ruff, MD;  Location: [facility];  Service: General;  Laterality: N/A;    Prior to Admission medications   Medication Sig Start Date End Date Taking? Authorizing Provider  acetaminophen (TYLENOL) 650 MG CR tablet Take 650 mg by mouth every 4 (four) hours as needed for pain.    Yes Historical Provider, MD  esomeprazole (NEXIUM) 20 MG capsule Take 20 mg by mouth daily as needed (for acid reflex).    Yes Historical Provider, MD  lidocaine (XYLOCAINE) 5 % ointment Apply 1 application topically as needed. 02/11/74  Yes Leighton Ruff, MD  metroNIDAZOLE (FLAGYL) 500 MG tablet Take 1 tablet (500 mg total) by mouth 3 (three) times daily. 6/43/32 9/51/88 Yes Leighton Ruff, MD  oxyCODONE (OXY IR/ROXICODONE) 5 MG immediate release tablet Take 1-2 tablets (5-10 mg total) by mouth every 4 (four) hours as needed for severe pain. 04/03/14  Yes Melrose Nakayama, MD  prochlorperazine (COMPAZINE)  10 MG tablet Take 10 mg by mouth every 6 (six) hours as needed for nausea or vomiting.   Yes Historical Provider, MD  sucralfate (CARAFATE) 1 GM/10ML suspension Place 10 mLs (1 g total) rectally 3 (three) times daily. 06/10/11  Yes Leighton Ruff, MD    Social History:  reports that he quit smoking about 3 months ago. His smoking use included Cigarettes. He has a 30 pack-year smoking history. He has never used smokeless tobacco. He reports that he does not drink alcohol or use illicit drugs.  Family History  Problem Relation Age of Onset  . Stomach cancer Mother   . Colon cancer Neg Hx     Review of Systems:  Constitutional: Denies fever, chills, diaphoresis. HEENT: Denies photophobia, eye pain, redness, hearing loss, ear  pain, congestion, sore throat, rhinorrhea, sneezing, mouth sores, trouble swallowing, neck pain, neck stiffness and tinnitus.   Respiratory: Denies SOB, DOE, cough, chest tightness,  and wheezing.   Cardiovascular: Denies chest pain, palpitations and leg swelling.  Gastrointestinal: Denies nausea, vomiting, abdominal pain, diarrhea, constipation, blood in stool and abdominal distention.  Genitourinary: Denies dysuria, urgency, frequency, hematuria, flank pain and difficulty urinating.  Endocrine: Denies: hot or cold intolerance, sweats, changes in hair or nails, polyuria, polydipsia. Musculoskeletal: Denies myalgias, back pain, joint swelling, arthralgias and gait problem.  Skin: Denies pallor, rash and wound.  Neurological: Denies dizziness, seizures, syncope, weakness, light-headedness, numbness and headaches.  Hematological: Denies adenopathy. Easy bruising, personal or family bleeding history  Psychiatric/Behavioral: Denies suicidal ideation, mood changes, confusion, nervousness, sleep disturbance and agitation   Physical Exam: Blood pressure 130/62, pulse 68, temperature 98.2 F (36.8 C), temperature source Oral, resp. rate 17, height 5' 9"  (1.753 m), weight 53.4 kg (117 lb 11.6 oz), SpO2 99.00%. General: Alert, awake, oriented x3, no distress, cachectic. HEENT: Normocephalic, atraumatic, pupils equal and reactive to light, extraocular movements intact, wears corrective lenses, dry mucous membranes. Neck: Supple, no JVD, no lymphadenopathy, no bruits, no goiter. Cardiovascular: Regular rate and rhythm, no murmurs, rubs or gallops auscultated. Lungs: Clear to auscultation bilaterally. Abdomen: Soft, nontender, nondistended, positive bowel sounds, left-sided colostomy present. Extremities: No clubbing, cyanosis or edema, positive pedal pulses. Neurologic: Grossly intact and nonfocal, and ambulated him.  Labs on Admission:  Results for orders placed during the hospital encounter of  04/13/14 (from the past 48 hour(s))  CBC WITH DIFFERENTIAL     Status: Abnormal   Collection Time    04/13/14 12:00 PM      Result Value Ref Range   WBC 14.6 (*) 4.0 - 10.5 K/uL   RBC 2.94 (*) 4.22 - 5.81 MIL/uL   Hemoglobin 8.0 (*) 13.0 - 17.0 g/dL   HCT 23.3 (*) 39.0 - 52.0 %   MCV 79.3  78.0 - 100.0 fL   MCH 27.2  26.0 - 34.0 pg   MCHC 34.3  30.0 - 36.0 g/dL   RDW 18.1 (*) 11.5 - 15.5 %   Platelets 776 (*) 150 - 400 K/uL   Neutrophils Relative % 87 (*) 43 - 77 %   Neutro Abs 12.6 (*) 1.7 - 7.7 K/uL   Lymphocytes Relative 7 (*) 12 - 46 %   Lymphs Abs 1.0  0.7 - 4.0 K/uL   Monocytes Relative 6  3 - 12 %   Monocytes Absolute 0.9  0.1 - 1.0 K/uL   Eosinophils Relative 0  0 - 5 %   Eosinophils Absolute 0.1  0.0 - 0.7 K/uL   Basophils Relative 0  0 - 1 %   Basophils Absolute 0.1  0.0 - 0.1 K/uL  COMPREHENSIVE METABOLIC PANEL     Status: Abnormal   Collection Time    04/13/14 12:00 PM      Result Value Ref Range   Sodium 134 (*) 137 - 147 mEq/L   Potassium 2.6 (*) 3.7 - 5.3 mEq/L   Comment: CRITICAL RESULT CALLED TO, READ BACK BY AND VERIFIED WITH:     NEASE, A RN 1238 04/13/14 [place],V   Chloride 93 (*) 96 - 112 mEq/L   CO2 26  19 - 32 mEq/L   Glucose, Bld 133 (*) 70 - 99 mg/dL   BUN 10  6 - 23 mg/dL   Creatinine, Ser 0.93  0.50 - 1.35 mg/dL   Calcium 8.5  8.4 - 10.5 mg/dL   Total Protein 7.2  6.0 - 8.3 g/dL   Albumin 2.2 (*) 3.5 - 5.2 g/dL   AST 15  0 - 37 U/L   ALT 9  0 - 53 U/L   Alkaline Phosphatase 73  39 - 117 U/L   Total Bilirubin <0.2 (*) 0.3 - 1.2 mg/dL   GFR calc non Af Amer 82 (*) >90 mL/min   GFR calc Af Amer >90  >90 mL/min   Comment: (NOTE)     The eGFR has been calculated using the CKD EPI equation.     This calculation has not been validated in all clinical situations.     eGFR's persistently <90 mL/min signify possible Chronic Kidney     Disease.  LIPASE, BLOOD     Status: None   Collection Time    04/13/14 12:00 PM      Result Value Ref Range    Lipase 11  11 - 59 U/L  I-STAT CG4 LACTIC ACID, ED     Status: Abnormal   Collection Time    04/13/14 12:05 PM      Result Value Ref Range   Lactic Acid, Venous 3.04 (*) 0.5 - 2.2 mmol/L  URINALYSIS, ROUTINE W REFLEX MICROSCOPIC     Status: None   Collection Time    04/13/14 12:08 PM      Result Value Ref Range   Color, Urine YELLOW  YELLOW   APPearance CLEAR  CLEAR   Specific Gravity, Urine 1.013  1.005 - 1.030   pH 6.0  5.0 - 8.0   Glucose, UA NEGATIVE  NEGATIVE mg/dL   Hgb urine dipstick NEGATIVE  NEGATIVE   Bilirubin Urine NEGATIVE  NEGATIVE   Ketones, ur NEGATIVE  NEGATIVE mg/dL   Protein, ur NEGATIVE  NEGATIVE mg/dL   Urobilinogen, UA 0.2  0.0 - 1.0 mg/dL   Nitrite NEGATIVE  NEGATIVE   Leukocytes, UA NEGATIVE  NEGATIVE   Comment: MICROSCOPIC NOT DONE ON URINES WITH NEGATIVE PROTEIN, BLOOD, LEUKOCYTES, NITRITE, OR GLUCOSE <1000 mg/dL.    Radiological Exams on Admission: Dg Abd Acute W/chest  04/13/2014   CLINICAL DATA:  Abdominal pain and nausea.  EXAM: ACUTE ABDOMEN SERIES (ABDOMEN 2 VIEW & CHEST 1 VIEW)  COMPARISON:  CT chest, abdomen and pelvis 03/21/2014. Chest radiograph 12/27/2013.  FINDINGS: The cardiomediastinal silhouette is within normal limits. The lungs remain hyperinflated. Peripheral right upper lobe mass is again seen and better evaluated on recent chest CT. Interstitial markings are mildly prominent bilaterally, similar to the prior study. There is no evidence of new airspace consolidation, overt edema, pleural effusion, or pneumothorax.  There is no evidence of intraperitoneal free air. No significant air-fluid  levels are identified. Gas is present in nondilated colon. There is a left sided colostomy. There is a paucity of small bowel gas. Surgical clips are present in the pelvis. No acute osseous abnormality is identified.  IMPRESSION: 1. Peripheral right upper lobe mass. No evidence of acute airspace disease. 2. Nonspecific bowel gas pattern with paucity of small  bowel gas.   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 -IV fluids and normal saline at 100 cc an hour. -IV and by mouth potassium repletion. -Check magnesium level and replete as necessary. -Obtain PT/OT evaluations for functional status and possibly even SNF placement. -Hospice consultation might not be a bad idea in this gentleman long-term oncology agrees. -Pain medications as needed. Have evaluated his sacral area and he does not appear to have a decubitus ulcer at this time, although he is certainly at risk for developing one given his cachexia and malnutrition.  DVT prophylaxis -Lovenox  CODE STATUS -Full code for now    Time Spent on Admission: 75 minutes  [facility] Pager: [phone] 04/13/2014, 4:42 PM

## 2014-04-14 DIAGNOSIS — R627 Adult failure to thrive: Secondary | ICD-10-CM

## 2014-04-14 LAB — CBC
HCT: 22.7 % — ABNORMAL LOW (ref 39.0–52.0)
Hemoglobin: 7.4 g/dL — ABNORMAL LOW (ref 13.0–17.0)
MCH: 25.7 pg — ABNORMAL LOW (ref 26.0–34.0)
MCHC: 32.6 g/dL (ref 30.0–36.0)
MCV: 78.8 fL (ref 78.0–100.0)
PLATELETS: 708 10*3/uL — AB (ref 150–400)
RBC: 2.88 MIL/uL — ABNORMAL LOW (ref 4.22–5.81)
RDW: 18.4 % — AB (ref 11.5–15.5)
WBC: 12.7 10*3/uL — AB (ref 4.0–10.5)

## 2014-04-14 LAB — BASIC METABOLIC PANEL
BUN: 8 mg/dL (ref 6–23)
CHLORIDE: 105 meq/L (ref 96–112)
CO2: 25 mEq/L (ref 19–32)
CREATININE: 0.92 mg/dL (ref 0.50–1.35)
Calcium: 8.4 mg/dL (ref 8.4–10.5)
GFR, EST NON AFRICAN AMERICAN: 82 mL/min — AB (ref >90)
Glucose, Bld: 93 mg/dL (ref 70–99)
Potassium: 4.9 mEq/L (ref 3.7–5.3)
Sodium: 137 mEq/L (ref 137–147)

## 2014-04-14 MED ORDER — OXYCODONE-ACETAMINOPHEN 5-325 MG PO TABS
2.0000 | ORAL_TABLET | Freq: Four times a day (QID) | ORAL | Status: DC | PRN
  Administered 2014-04-14 – 2014-04-15 (×3): 2 via ORAL
  Filled 2014-04-14 (×3): qty 2

## 2014-04-14 NOTE — Progress Notes (Signed)
[facility] PROGRESS NOTE  HUSTON STONEHOCKER BWI:[id number] DOB: [birth year] DOA: 04/13/2014 PCP: [name], MD  Assessment/Plan: FTT/Severe protein caloric malnutrition/Hypokalemia -Continue IVF today at current rate. -Continue to correct potassium. -Per PT/OT did well and does not require further follow up. -Suspect might be ready to DC home in am. -Pain management as needed.  Code Status: Full Code Family Communication: Wife at bedside updated on plan of care.  Disposition Plan: Home when ready; likely 24-48 hours.   Consultants:  None   Antibiotics:  None   Subjective: C/o pain in his backside.  Objective: Filed Vitals:   04/13/14 1452 04/13/14 1504 04/13/14 2123 04/14/14 0357  BP: 130/62  117/53 120/52  Pulse: 68  74 69  Temp: 98.2 F (36.8 C)  98.6 F (37 C) 97 F (36.1 C)  TempSrc: Oral  Oral Oral  Resp: 17  18 18   Height:  5\' 9"  (1.753 m)    Weight:  53.4 kg (117 lb 11.6 oz)    SpO2: 99%  95% 96%    Intake/Output Summary (Last 24 hours) at 04/14/14 1343 Last data filed at 04/14/14 6384  Gross per 24 hour  Intake 1576.67 ml  Output   1675 ml  Net -98.33 ml   Filed Weights   04/13/14 1504  Weight: 53.4 kg (117 lb 11.6 oz)    Exam:   General:  AA Ox3  Cardiovascular: RRR  Respiratory: CTA B  Abdomen: S/NT/ND/+BS  Extremities: no C/C/E   Neurologic:  Non-focal, intact  Data Reviewed: Basic Metabolic Panel:  Recent Labs Lab 04/13/14 1200 04/13/14 1706 04/14/14 0519  NA 134*  --  137  K 2.6*  --  4.9  CL 93*  --  105  CO2 26  --  25  GLUCOSE 133*  --  93  BUN 10  --  8  CREATININE 0.93 0.81 0.92  CALCIUM 8.5  --  8.4  MG  --  1.5  --    Liver Function Tests:  Recent Labs Lab 04/13/14 1200  AST 15  ALT 9  ALKPHOS 73  BILITOT <0.2*  PROT 7.2  ALBUMIN 2.2*    Recent Labs Lab 04/13/14 1200  LIPASE 11   No results found for this basename: AMMONIA,  in the last 168 hours CBC:  Recent Labs Lab  04/09/14 0651 04/13/14 1200 04/13/14 1706 04/14/14 0519  WBC  --  14.6* 16.6* 12.7*  NEUTROABS  --  12.6*  --   --   HGB 9.9* 8.0* 7.9* 7.4*  HCT  --  23.3* 23.6* 22.7*  MCV  --  79.3 78.9 78.8  PLT  --  776* 742* 708*   Cardiac Enzymes: No results found for this basename: CKTOTAL, CKMB, CKMBINDEX, TROPONINI,  in the last 168 hours BNP (last 3 results) No results found for this basename: PROBNP,  in the last 8760 hours CBG: No results found for this basename: GLUCAP,  in the last 168 hours  No results found for this or any previous visit (from the past 240 hour(s)).   Studies: Dg Abd Acute W/chest  04/13/2014   CLINICAL DATA:  Abdominal pain and nausea.  EXAM: ACUTE ABDOMEN SERIES (ABDOMEN 2 VIEW & CHEST 1 VIEW)  COMPARISON:  CT chest, abdomen and pelvis 03/21/2014. Chest radiograph 12/27/2013.  FINDINGS: The cardiomediastinal silhouette is within normal limits. The lungs remain hyperinflated. Peripheral right upper lobe mass is again seen and better evaluated on recent chest CT. Interstitial markings are mildly prominent  bilaterally, similar to the prior study. There is no evidence of new airspace consolidation, overt edema, pleural effusion, or pneumothorax.  There is no evidence of intraperitoneal free air. No significant air-fluid levels are identified. Gas is present in nondilated colon. There is a left sided colostomy. There is a paucity of small bowel gas. Surgical clips are present in the pelvis. No acute osseous abnormality is identified.  IMPRESSION: 1. Peripheral right upper lobe mass. No evidence of acute airspace disease. 2. Nonspecific bowel gas pattern with paucity of small bowel gas.   Electronically Signed   By: Logan Bores   On: 04/13/2014 12:37    Scheduled Meds: . enoxaparin (LOVENOX) injection  40 mg Subcutaneous Q24H  . metroNIDAZOLE  500 mg Oral TID  . sodium chloride  3 mL Intravenous Q12H   Continuous Infusions: . 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 Greater than 50% of this time was spent in direct contact with the patient coordinating care.    [facility] Pager [phone]  If 7PM-7AM, please contact night-coverage at [email or web address], password [facility] 04/14/2014, 1:43 PM  LOS: 1 day

## 2014-04-14 NOTE — Evaluation (Signed)
Physical Therapy Evaluation Patient Details Name: Randy Gould MRN: [id number] DOB: March 24, 1941 Today's Date: 04/14/2014   History of Present Illness  73yo male adm with dehydration, FTT; PMHx: lung Ca, rectal CA, colostomy, COPD, recently quit smoking  Clinical Impression  Pt expressed frustration regarding his care, pain management, etc; RN aware; Pt cooperative with PT and at baseline for functional mobility; I have encouraged pt wife to let him do as much as possible at home for himself;  Encouraged pt to mobilize with staff/at home as he feels able with rests in between due to pt reported fatigue; PT will sign off, discussed with pt and wife;  Wife states he looks stronger today    Follow Up Recommendations No PT follow up    Equipment Recommendations  None recommended by PT    Recommendations for Other Services       Precautions / Restrictions Precautions Precautions: None Restrictions Weight Bearing Restrictions: No      Mobility  Bed Mobility Overal bed mobility: Needs Assistance Bed Mobility: Supine to Sit;Sit to Supine     Supine to sit: Supervision Sit to supine: Supervision   General bed mobility comments: cues to self assist; pt yells at his wife to assist  Transfers   Equipment used: None Transfers: Sit to/from Stand Sit to Stand: Independent            Ambulation/Gait Ambulation/Gait assistance: Independent   Assistive device: None Gait Pattern/deviations: WFL(Within Functional Limits)     General Gait Details: pt wife report unsteady gait, pt with one slight posterior LOB  with sudden stop/turn and I recovery  Stairs            Wheelchair Mobility    Modified Rankin (Stroke Patients Only)       Balance Overall balance assessment: Independent Sitting-balance support: Feet supported;No upper extremity supported Sitting balance-Leahy Scale: Normal     Standing balance support: No upper extremity supported;During functional  activity Standing balance-Leahy Scale: Normal               High level balance activites: Backward walking;Direction changes;Turns;Sudden stops;Head turns High Level Balance Comments: 1 slight posterior LOB with I recovery             Pertinent Vitals/Pain O2 sats 90-97% on RA HR 90-100 with amb Pt was premedicated prior to tx but continues to c/o pain    Home Living Family/patient expects to be discharged to:: Private residence Living Arrangements: Spouse/significant other Available Help at Discharge:  (75 yo grandson) Type of Home: House Home Access: Stairs to enter   [employer] of Steps: 3  Home Layout: Multi-level;Able to live on main level with bedroom/bathroom Home Equipment: Walker - standard      Prior Function Level of Independence: Needs assistance;Independent   Gait / Transfers Assistance Needed: recently too eweak to do much per wife  ADL's / Homemaking Assistance Needed: not taking showers due to weakness per wife        Hand Dominance        Extremity/Trunk Assessment   Upper Extremity Assessment: Defer to OT evaluation           Lower Extremity Assessment: Overall WFL for tasks assessed         Communication      Cognition Arousal/Alertness: Awake/alert Behavior During Therapy: WFL for tasks assessed/performed Overall Cognitive Status: Within Functional Limits for tasks assessed  General Comments General comments (skin integrity, edema, etc.): have encouraged pt to change positions, turn when in bed due to reported skin break down sacral area    Exercises        Assessment/Plan    PT Assessment Patent does not need any further PT services  PT Diagnosis     PT Problem List    PT Treatment Interventions     PT Goals (Current goals can be found in the Care Plan section) Acute Rehab PT Goals Patient Stated Goal: to get out of here and decr pain; pt reports he is "just tired of all  this"; RN and MD are aware PT Goal Formulation: No goals set, d/c therapy    Frequency     Barriers to discharge        Co-evaluation               End of Session Equipment Utilized During Treatment: Gait belt Activity Tolerance: Patient tolerated treatment well Patient left: in bed;with call bell/phone within reach;with family/visitor present Nurse Communication: Mobility status         Time: 9390-3009 PT Time Calculation (min): 24 min   Charges:   PT Evaluation $Initial PT Evaluation Tier I: 1 Procedure PT Treatments $Gait Training: 8-22 mins   PT G Codes:          Neil Crouch 04/14/2014, 10:40 AM

## 2014-04-14 NOTE — Evaluation (Signed)
Occupational Therapy Evaluation Patient Details Name: Randy Gould MRN: [id number] DOB: 03-24-1941 Today's Date: 04/14/2014    History of Present Illness 73yo male adm with dehydration, FTT; PMHx: lung Ca with rib mets, rectal CA, colostomy, COPD, recently quit smoking   Clinical Impression   Pt and wife are primarily concerned about his painful, raised sacral area, significant weakness, and inability to eat.  He's able to perform ADL and mobility WFL, but fatigues quickly.  Recommended pt consider a shower seat and grab bar for safety and energy conservation if this is a priority to him.    Follow Up Recommendations  No OT follow up    Equipment Recommendations  None recommended by OT    Recommendations for Other Services       Precautions / Restrictions Precautions Precautions: None Restrictions Weight Bearing Restrictions: No      Mobility Bed Mobility Overal bed mobility: Needs Assistance Bed Mobility: Supine to Sit;Sit to Supine     Supine to sit: Supervision Sit to supine: Supervision   General bed mobility comments: pt seeks help from his wife, but was able to perform without physical assist  Transfers   Equipment used: None Transfers: Sit to/from Stand Sit to Stand: Independent              Balance Overall balance assessment: Independent Sitting-balance support: Feet supported;No upper extremity supported Sitting balance-Leahy Scale: Normal     Standing balance support: No upper extremity supported;During functional activity Standing balance-Leahy Scale: Normal               High level balance activites: Backward walking;Direction changes;Turns;Sudden stops;Head turns High Level Balance Comments: 1 slight posterior LOB with I recovery            ADL Overall ADL's : Modified independent                                       General ADL Comments: Pt fatigues easily, but is able to perform sponge bathing, dressing and  toileting. Recommended pt use a shower seat and consider a grab bar to shower.       Vision                     Perception     Praxis      Pertinent Vitals/Pain Sacral pain, premedicated, lays on side to avoid pressure, VSS     Hand Dominance Right   Extremity/Trunk Assessment Upper Extremity Assessment Upper Extremity Assessment: Overall WFL for tasks assessed   Lower Extremity Assessment Lower Extremity Assessment: Generalized weakness   Cervical / Trunk Assessment Cervical / Trunk Assessment: Other exceptions (raised sacral area that is painful to the touch)   Communication Communication Communication: No difficulties   Cognition Arousal/Alertness: Awake/alert Behavior During Therapy: WFL for tasks assessed/performed Overall Cognitive Status: Within Functional Limits for tasks assessed                     General Comments       Exercises       Shoulder Instructions      Home Living Family/patient expects to be discharged to:: Private residence Living Arrangements: Spouse/significant other Available Help at Discharge: Family Type of Home: House Home Access: Stairs to enter [employer] of Steps: 3    Home Layout: Multi-level;Able to live on main level with bedroom/bathroom  Bathroom Shower/Tub: Teacher, early years/pre: Standard     Home Equipment: Environmental consultant - standard          Prior Functioning/Environment Level of Independence: Needs assistance;Independent  Gait / Transfers Assistance Needed: walking household distances only due to weakness ADL's / Homemaking Assistance Needed: not taking showers due to weakness per wife, wife does housekeeping and meal prep        OT Diagnosis:     OT Problem List:     OT Treatment/Interventions:      OT Goals(Current goals can be found in the care plan section) Acute Rehab OT Goals Patient Stated Goal: to get out of here and decr pain; pt reports he is "just tired  of all this"; RN and MD are aware  OT Frequency:     Barriers to D/C:            Co-evaluation              End of Session    Activity Tolerance: Patient limited by pain;Patient limited by fatigue Patient left: in bed;with call bell/phone within reach;with family/visitor present   Time: 9833-8250 OT Time Calculation (min): 32 min Charges:  OT General Charges $OT Visit: 1 Procedure OT Evaluation $Initial OT Evaluation Tier I: 1 Procedure OT Treatments $Self Care/Home Management : 8-22 mins G-Codes:    Haze Boyden Isak Sotomayor 05/12/14, 12:04 PM [phone]

## 2014-04-15 LAB — CBC
HCT: 25.3 % — ABNORMAL LOW (ref 39.0–52.0)
Hemoglobin: 8.1 g/dL — ABNORMAL LOW (ref 13.0–17.0)
MCH: 26 pg (ref 26.0–34.0)
MCHC: 32 g/dL (ref 30.0–36.0)
MCV: 81.4 fL (ref 78.0–100.0)
PLATELETS: 679 10*3/uL — AB (ref 150–400)
RBC: 3.11 MIL/uL — AB (ref 4.22–5.81)
RDW: 18.9 % — ABNORMAL HIGH (ref 11.5–15.5)
WBC: 12.9 10*3/uL — ABNORMAL HIGH (ref 4.0–10.5)

## 2014-04-15 LAB — BASIC METABOLIC PANEL
BUN: 8 mg/dL (ref 6–23)
CO2: 22 meq/L (ref 19–32)
Calcium: 8.3 mg/dL — ABNORMAL LOW (ref 8.4–10.5)
Chloride: 101 mEq/L (ref 96–112)
Creatinine, Ser: 0.85 mg/dL (ref 0.50–1.35)
GFR calc Af Amer: >90 mL/min (ref >90)
GFR calc non Af Amer: 85 mL/min — ABNORMAL LOW (ref >90)
GLUCOSE: 89 mg/dL (ref 70–99)
Potassium: 3.7 mEq/L (ref 3.7–5.3)
SODIUM: 138 meq/L (ref 137–147)

## 2014-04-15 NOTE — Progress Notes (Signed)
Patient's HR kept going into the 30's-40's. Patient asymptomatic. Telemetry was called by RN and they told RN that patient's HR started going into the 40's during the day. NP notified. No new orders given. Will continue to monitor.

## 2014-04-15 NOTE — Discharge Summary (Signed)
Physician Discharge Summary  Randy Gould WUJ:[id number] DOB: 1941/05/07 DOA: 04/13/2014  PCP: [name], MD  Admit date: 04/13/2014 Discharge date: 04/15/2014  Time spent: 45 minutes  Recommendations for Outpatient Follow-up:  -Will be discharged home today. -Advised him and his wife that he should seek medical oncology care.   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 He presents to the hospital today with the above-mentioned complaints. He has not had any appetite and has had no food or fluid intake in at least 6 days. He has become progressively weaker to the point where his wife is having difficulty managing him at home. In the emergency department he is found to have a potassium of 2.6, found to be severely dehydrated, hospitalist admission was requested.   Hospital Course:   FTT/Severe protein caloric malnutrition/Hypokalemia  -Potassium has been repleted. -Per PT/OT did well and does not require further follow up.  -Pain management as needed with PRN oxycodone. -Have encouraged PO intake and have recommended Ensure at least BID. -Have advised him and his wife that they should seek medical onc care, as it would appear that he has only been seen by CVTS and radiation oncology, and they might help manage his long-term pain and malnutrition related to his malignancy.   Procedures:  None    Consultations:  None  Discharge Instructions  Discharge Orders   Future Appointments Provider Department Dept Phone   04/19/2014 9:30 AM Grace Isaac, MD [facility] [phone]   Future Orders Complete By Expires   Discontinue IV  As directed    Increase activity slowly  As directed        Medication List    STOP taking these medications       lidocaine 5 % ointment  Commonly known as:  XYLOCAINE      TAKE these medications       acetaminophen 650 MG CR tablet  Commonly known as:  TYLENOL  Take 650 mg by mouth every 4 (four) hours as needed for pain.     esomeprazole 20 MG capsule  Commonly known as:  NEXIUM  Take 20 mg by mouth daily as needed (for acid reflex).     metroNIDAZOLE 500 MG tablet  Commonly known as:  FLAGYL  Take 1 tablet (500 mg total) by mouth 3 (three) times daily.     oxyCODONE 5 MG immediate release tablet  Commonly known as:  Oxy IR/ROXICODONE  Take 1-2 tablets (5-10 mg total) by mouth every 4 (four) hours as needed for severe pain.     prochlorperazine 10 MG tablet  Commonly known as:  COMPAZINE  Take 10 mg by mouth every 6 (six) hours as needed for nausea or vomiting.     sucralfate 1 GM/10ML suspension  Commonly known as:  CARAFATE  Place 10 mLs (1 g total) rectally 3 (three) times daily.  Allergies  Allergen Reactions  . Ativan [Lorazepam] Other (See Comments)    REACTION: Increase heart rate  . Other Itching and Rash    States "head to toe" itching from a "powerful antibiotic" administered years ago.  Cannot recall name of antibiotic.       Follow-up Information   Follow up with [name], MD. Schedule an appointment as soon as possible for a visit in 2 weeks.   Specialty:  Family Medicine   Contact information:   [address] [place] [address] [phone]        The results of significant diagnostics from this hospitalization (including  imaging, microbiology, ancillary and laboratory) are listed below for reference.    Significant Diagnostic Studies: Ct Chest W Contrast  03/21/2014   CLINICAL DATA:  Right upper lobe lung cancer. Prior history of rectal cancer.  EXAM: CT CHEST, ABDOMEN, AND PELVIS WITH CONTRAST  TECHNIQUE: Multidetector CT imaging of the chest, abdomen and pelvis was performed following the standard protocol during bolus administration of intravenous contrast.  CONTRAST:  134mL OMNIPAQUE IOHEXOL 300 MG/ML  SOLN  COMPARISON:  PET-CT 12/21/2013.  Chest CT 12/12/2013.  FINDINGS: CT CHEST FINDINGS  Mediastinum: Heart size is normal. There is no significant pericardial fluid, thickening or pericardial calcification. No pathologically enlarged mediastinal or hilar lymph nodes. Mild atherosclerosis throughout the thoracic aorta. Esophagus is unremarkable in appearance.  Lungs/Pleura: When compared to prior examinations, the previously noted pleural-based mass in the periphery of the right upper lobe has decreased in size, currently measuring 4.8 x 2.3 cm. 10 x 5 mm pleural based nodule in the periphery of the left upper lobe (image 10 of series 4) corresponding to small focus of hypermetabolism on recent PET-CT appears unchanged. There is also a partially calcified pleural based nodular opacity in the medial aspect of the right upper lobe measuring 1.2 x 1.0 cm (image 6 of series 4), which is unchanged, and was not hypermetabolic on the recent PET-CT. No other new suspicious appearing pulmonary nodules or masses are otherwise noted. No acute consolidative airspace disease. No pleural effusions. Moderate centrilobular emphysema, most pronounced throughout the lung apices.  Musculoskeletal: When compared to prior examinations the osteolytic changes in the lateral aspect of the right third rib are for more dramatic. No other aggressive appearing lytic or blastic lesions are noted in the visualized portions of the skeleton.  CT ABDOMEN AND  PELVIS FINDINGS  Abdomen/Pelvis: Several sub cm low-attenuation hepatic lesions are noted on image 59, 60 and 61 in the left lobe of the liver. These are nonspecific, but similar to prior study 03/05/2011, and therefore favored to be benign, likely small cysts. The appearance of the gallbladder, pancreas, spleen, bilateral adrenal glands and left kidney is unremarkable. There is mild right hydroureteronephrosis which extends to the level of the pelvis adjacent to the sacrum, presumably secondary to mass effect or traction and narrowing of the distal third of the right ureter related to the presacral and perirectal process (see discussion below). No significant volume of ascites. No pneumoperitoneum. No pathologic distention of small bowel. Atherosclerotic calcifications throughout the abdominal and pelvic vasculature, without evidence of aneurysm.  Status post low anterior resection. There is a left upper quadrant colostomy, with a moderate parastomal hernia which contains a portion of the body/antrum of the stomach. Adjacent to the resection site in the low anatomic pelvis there is a complex soft tissue, gas and fluid containing mass in the perirectal and presacral region which measures approximately 5.4  x 7.2 x 8.3 cm. This appears to involve the rectal remnant, with extension posteriorly, overall appearing to represent an inflammatory mass with multiple fistulae. Although, the possibility of residual malignancy with cavitation of a mass in this region is difficult to exclude.  Musculoskeletal: Areas of osteolysis in the sacrum adjacent to the previously described presacral process. Two of these at the level of S2 and S3 have well-defined margins with a sclerotic borders.  IMPRESSION: 1. Positive response to therapy with slight regression of the previously noted right upper lobe mass. Greater apparent bony involvement in the lateral aspect of the right third rib may be related to increasing invasion of the rib, or  could be secondary to evolving changes following recent radiation therapy. 2. Previously described small pleural-based hypermetabolic nodule in the periphery of the left upper lobe (image 10 of series 4) is unchanged. Continued attention on followup studies is recommended. 3. 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 The possibility of residual/recurrent malignancy in this region is difficult to entirely exclude, but is not strongly favored; however, correlation with recent biopsy results is recommended. 4. Left upper quadrant colostomy with moderate-sized parastomal hernia which contains a short portion of the body/antrum of the stomach. 5. New mild right hydroureteronephrosis which is presumably related to either mass effect or traction on the distal third of the right ureter related to the previously described presacral process. Urologic consultation may be appropriate if clinically indicated. 6. Additional incidental findings, as above.   Electronically Signed   By: Vinnie Langton M.D.   On: 03/21/2014 16:49   Ct Abdomen Pelvis W Contrast  03/21/2014   CLINICAL DATA:  Right upper lobe lung cancer. Prior history of rectal cancer.  EXAM: CT CHEST, ABDOMEN, AND PELVIS WITH CONTRAST  TECHNIQUE: Multidetector CT imaging of the chest, abdomen and pelvis was performed following the standard protocol during bolus administration of intravenous contrast.  CONTRAST:  159mL OMNIPAQUE IOHEXOL 300 MG/ML  SOLN  COMPARISON:  PET-CT 12/21/2013.  Chest CT 12/12/2013.  FINDINGS: CT CHEST FINDINGS  Mediastinum: Heart size is normal. There is no significant pericardial fluid, thickening or pericardial calcification. No pathologically enlarged mediastinal or hilar lymph nodes. Mild atherosclerosis throughout the thoracic aorta. Esophagus is unremarkable in appearance.   Lungs/Pleura: When compared to prior examinations, the previously noted pleural-based mass in the periphery of the right upper lobe has decreased in size, currently measuring 4.8 x 2.3 cm. 10 x 5 mm pleural based nodule in the periphery of the left upper lobe (image 10 of series 4) corresponding to small focus of hypermetabolism on recent PET-CT appears unchanged. There is also a partially calcified pleural based nodular opacity in the medial aspect of the right upper lobe measuring 1.2 x 1.0 cm (image 6 of series 4), which is unchanged, and was not hypermetabolic on the recent PET-CT. No other new suspicious appearing pulmonary nodules or masses are otherwise noted. No acute consolidative airspace disease. No pleural effusions. Moderate centrilobular emphysema, most pronounced throughout the lung apices.  Musculoskeletal: When compared to prior examinations the osteolytic changes in the lateral aspect of the right third rib are for more dramatic. No other aggressive appearing lytic or blastic lesions are noted in the visualized portions of the skeleton.  CT ABDOMEN AND PELVIS FINDINGS  Abdomen/Pelvis: Several sub cm low-attenuation hepatic lesions are noted on image 59, 60  and 61 in the left lobe of the liver. These are nonspecific, but similar to prior study 03/05/2011, and therefore favored to be benign, likely small cysts. The appearance of the gallbladder, pancreas, spleen, bilateral adrenal glands and left kidney is unremarkable. There is mild right hydroureteronephrosis which extends to the level of the pelvis adjacent to the sacrum, presumably secondary to mass effect or traction and narrowing of the distal third of the right ureter related to the presacral and perirectal process (see discussion below). No significant volume of ascites. No pneumoperitoneum. No pathologic distention of small bowel. Atherosclerotic calcifications throughout the abdominal and pelvic vasculature, without evidence of aneurysm.   Status post low anterior resection. There is a left upper quadrant colostomy, with a moderate parastomal hernia which contains a portion of the body/antrum of the stomach. Adjacent to the resection site in the low anatomic pelvis there is a complex soft tissue, gas and fluid containing mass in the perirectal and presacral region which measures approximately 5.4 x 7.2 x 8.3 cm. This appears to involve the rectal remnant, with extension posteriorly, overall appearing to represent an inflammatory mass with multiple fistulae. Although, the possibility of residual malignancy with cavitation of a mass in this region is difficult to exclude.  Musculoskeletal: Areas of osteolysis in the sacrum adjacent to the previously described presacral process. Two of these at the level of S2 and S3 have well-defined margins with a sclerotic borders.  IMPRESSION: 1. Positive response to therapy with slight regression of the previously noted right upper lobe mass. Greater apparent bony involvement in the lateral aspect of the right third rib may be related to increasing invasion of the rib, or could be secondary to evolving changes following recent radiation therapy. 2. Previously described small pleural-based hypermetabolic nodule in the periphery of the left upper lobe (image 10 of series 4) is unchanged. Continued attention on followup studies is recommended. 3. 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 The possibility of residual/recurrent malignancy in this region is difficult to entirely exclude, but is not strongly favored; however, correlation with recent biopsy results is recommended. 4. Left upper quadrant colostomy with moderate-sized parastomal hernia which contains a short portion of the body/antrum of the stomach. 5. New mild right hydroureteronephrosis which is  presumably related to either mass effect or traction on the distal third of the right ureter related to the previously described presacral process. Urologic consultation may be appropriate if clinically indicated. 6. Additional incidental findings, as above.   Electronically Signed   By: Vinnie Langton M.D.   On: 03/21/2014 16:49   Dg Abd Acute W/chest  04/13/2014   CLINICAL DATA:  Abdominal pain and nausea.  EXAM: ACUTE ABDOMEN SERIES (ABDOMEN 2 VIEW & CHEST 1 VIEW)  COMPARISON:  CT chest, abdomen and pelvis 03/21/2014. Chest radiograph 12/27/2013.  FINDINGS: The cardiomediastinal silhouette is within normal limits. The lungs remain hyperinflated. Peripheral right upper lobe mass is again seen and better evaluated on recent chest CT. Interstitial markings are mildly prominent bilaterally, similar to the prior study. There is no evidence of new airspace consolidation, overt edema, pleural effusion, or pneumothorax.  There is no evidence of intraperitoneal free air. No significant air-fluid levels are identified. Gas is present in nondilated colon. There is a left sided colostomy. There is a paucity of small bowel gas. Surgical clips are present in the  pelvis. No acute osseous abnormality is identified.  IMPRESSION: 1. Peripheral right upper lobe mass. No evidence of acute airspace disease. 2. Nonspecific bowel gas pattern with paucity of small bowel gas.   Electronically Signed   By: Logan Bores   On: 04/13/2014 12:37    Microbiology: Recent Results (from the past 240 hour(s))  URINE CULTURE     Status: None   Collection Time    04/13/14 12:08 PM      Result Value Ref Range Status   Specimen Description URINE, CLEAN CATCH   Final   Special Requests NONE   Final   Culture  Setup Time     Final   Value: 04/13/2014 15:09     Performed at [place] PENDING   Incomplete   Culture     Final   Value: Culture reincubated for better growth     Performed at [facility]     Report Status PENDING   Incomplete     Labs: Basic Metabolic Panel:  Recent Labs Lab 04/13/14 1200 04/13/14 1706 04/14/14 0519 04/15/14 0440  NA 134*  --  137 138  K 2.6*  --  4.9 3.7  CL 93*  --  105 101  CO2 26  --  25 22  GLUCOSE 133*  --  93 89  BUN 10  --  8 8  CREATININE 0.93 0.81 0.92 0.85  CALCIUM 8.5  --  8.4 8.3*  MG  --  1.5  --   --    Liver Function Tests:  Recent Labs Lab 04/13/14 1200  AST 15  ALT 9  ALKPHOS 73  BILITOT <0.2*  PROT 7.2  ALBUMIN 2.2*    Recent Labs Lab 04/13/14 1200  LIPASE 11   No results found for this basename: AMMONIA,  in the last 168 hours CBC:  Recent Labs Lab 04/09/14 0651 04/13/14 1200 04/13/14 1706 04/14/14 0519 04/15/14 0440  WBC  --  14.6* 16.6* 12.7* 12.9*  NEUTROABS  --  12.6*  --   --   --   HGB 9.9* 8.0* 7.9* 7.4* 8.1*  HCT  --  23.3* 23.6* 22.7* 25.3*  MCV  --  79.3 78.9 78.8 81.4  PLT  --  776* 742* 708* 679*   Cardiac Enzymes: No results found for this basename: CKTOTAL, CKMB, CKMBINDEX, TROPONINI,  in the last 168 hours BNP: BNP (last 3 results) No results found for this basename: PROBNP,  in the last 8760 hours CBG: No results found for this basename: GLUCAP,  in the last 168 hours     Signed:  Erline Hau  [facility] Pager: [phone] 04/15/2014, 3:09 PM

## 2014-04-16 ENCOUNTER — Other Ambulatory Visit: Payer: Self-pay | Admitting: Cardiothoracic Surgery

## 2014-04-16 DIAGNOSIS — C3410 Malignant neoplasm of upper lobe, unspecified bronchus or lung: Secondary | ICD-10-CM

## 2014-04-16 LAB — URINE CULTURE

## 2014-04-19 ENCOUNTER — Encounter: Payer: Self-pay | Admitting: *Deleted

## 2014-04-19 ENCOUNTER — Ambulatory Visit: Payer: Medicare Other | Attending: Physical Therapy | Admitting: Physical Therapy

## 2014-04-19 ENCOUNTER — Encounter: Payer: Self-pay | Admitting: Cardiothoracic Surgery

## 2014-04-19 ENCOUNTER — Telehealth: Payer: Self-pay | Admitting: *Deleted

## 2014-04-19 ENCOUNTER — Ambulatory Visit
Admission: RE | Admit: 2014-04-19 | Discharge: 2014-04-19 | Disposition: A | Discharge status: Home / Self Care | Payer: Medicare Other | Source: Ambulatory Visit | Attending: Cardiothoracic Surgery | Admitting: Cardiothoracic Surgery

## 2014-04-19 ENCOUNTER — Ambulatory Visit (INDEPENDENT_AMBULATORY_CARE_PROVIDER_SITE_OTHER): Payer: Medicare Other | Admitting: Cardiothoracic Surgery

## 2014-04-19 VITALS — BP 107/60 | HR 100 | Resp 20 | Ht 69.0 in | Wt 117.0 lb

## 2014-04-19 DIAGNOSIS — C3410 Malignant neoplasm of upper lobe, unspecified bronchus or lung: Secondary | ICD-10-CM

## 2014-04-19 DIAGNOSIS — D381 Neoplasm of uncertain behavior of trachea, bronchus and lung: Secondary | ICD-10-CM

## 2014-04-19 NOTE — Progress Notes (Signed)
[address]       [place] [address]             [phone]                          Vincente L Allender [facility] Medical Record #[id number] Date of Birth: May 01, 1941  Referring: Dr [name]  Primary Care: [name], MD  Chief Complaint:   Right chest wall pain   History of Present Illness:    Randy Gould 73 y.o. male is seen in the office for rt chest wall pain with chest wall and lung mass. The patient was treated for stage T3 N1 rectal cancer at age 46 with LAR followed by chemoradiotherapy complicated by anastomosis breakdown requiring revision multiple surgeries as recently as 2005. This ultimately required colostomy for diversion. More recently, he presented with chest pain on right side. Chest X-Ray and subsequent CT angio showed a 5.7 cm right  upper lobe peripheral mass with likely chest wall invasion. PET scan confirmed hypermetabolisim there without mediastinal lymphadenopathy suggesting T3 N0 disease, with some hypermetabolic pelvic nodes of uncertain significance. CT biopsy of the lung mass shows adenocarcinoma. ALK & EGFR  Negative.  There is a small focus of hypermetabolic nodular pleural thickening in the left upper lobe measuring 7 mm (image 60) with mild metabolic activity (SUV max 2.6).  Patient has remained smoke free since first seen.  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   The patient has completed radiation treatments to the right upper lobe chest wall mass. He notes 95% improvement in chest wall pain. He has had recurrent urinary tract infection, complains of increasing pain in the pelvis, described as bony pain made worse by sitting. Also notes some mild testicular pain. Patient had  Colonoscopy this week no evidence of malignancy but has extensive osteomyelitics involving pelvis   The patient had severe weakness and was admitted for weakness last week. While in hospital wife fell in his hospital room and  question of pelvic fracture.      Current Activity/ Functional Status:  Patient is independent with mobility/ambulation, transfers, ADL's, IADL's.   Zubrod Score: At the time of surgery this patient's most appropriate activity status/level should be described as: []     0    Normal activity, no symptoms [x]     1    Restricted in physical strenuous activity but ambulatory, able to do out light work []     2    Ambulatory and capable of self care, unable to do work activities, up and about               >50 % of waking hours                              []     3    Only limited self care, in bed greater than 50% of waking hours []     4    Completely disabled, no self care, confined to bed or chair []     5    Moribund   Past Medical History  Diagnosis Date  . Emphysema/COPD   . Rectal pain   . Colostomy in place   . Proctitis   . Hypotonic bladder   . Incomplete emptying of bladder   . History of malignant neoplasm of rectum     1996-- (  T3 N1)   S/P  RESECTION WITH COLOSTOMY/   CHEMORADATION THERAPY  . Pelvic mass   . History of radiation therapy     RIGHT UPPER LUNG LOBE--   01-30-2014  to  03-07-2014  (46 gray)  . GERD (gastroesophageal reflux disease)   . Wears glasses   . Wears dentures   . Adenocarcinoma of lung     RIGHT UPPER LOBE (stage IIB , T2  N0, M0)--  DX JAN 2015  S/P  RADIATION TX  (COMPLETE  03-07-2014)    Past Surgical History  Procedure Laterality Date  . Nasal septum surgery  1972  . Exploratory laparotomy /  lysis adhesions  AUG  1997    CHRONIC BOWEL OBSTRUCTION  . Small bowel resection and repair colostomy  JAN  2000  . Colonoscopy w/ polypectomy  03-16-2014  . Low anterior resection with primary anastomosis/  trnasverse loop colostomy with division  11-10-1995  . Examination under anesthesia N/A 04/09/2014    Procedure: EXAM UNDER ANESTHESIA, FLEX SIGMOIDOSCOPYM POSSIBLE BIOPSY;  Surgeon: Leighton Ruff, MD;  Location: [facility];   Service: General;  Laterality: N/A;    Family History  Problem Relation Age of Onset  . Stomach cancer Mother   . Colon cancer Neg Hx     History   Social History  . Marital Status: Married    Spouse Name: N/A    Number of Children: N/A  . Years of Education: N/A   Occupational History  . Not on file.   Social History Main Topics  . Smoking status: Current Every Day Smoker -- 1.00 packs/day for 30 years    Types: Cigarettes  . Smokeless tobacco: Not on file  . Alcohol Use: Yes     Comment: 1-2 drinks per weel  . Drug Use: No  . Sexual Activity: Not on file           History  Smoking status  . Former Smoker -- 1.00 packs/day for 30 years  . Types: Cigarettes  . Quit date: 01/10/2014  Smokeless tobacco  . Never Used    History  Alcohol Use No    Comment: pt denies     Allergies  Allergen Reactions  . Ativan [Lorazepam] Other (See Comments)    REACTION: Increase heart rate  . Other Itching and Rash    States "head to toe" itching from a "powerful antibiotic" administered years ago.  Cannot recall name of antibiotic.    Current Outpatient Prescriptions  Medication Sig Dispense Refill  . acetaminophen (TYLENOL) 650 MG CR tablet Take 650 mg by mouth every 4 (four) hours as needed for pain.       Marland Kitchen esomeprazole (NEXIUM) 20 MG capsule Take 20 mg by mouth daily as needed (for acid reflex).       Marland Kitchen oxyCODONE (OXY IR/ROXICODONE) 5 MG immediate release tablet Take 1-2 tablets (5-10 mg total) by mouth every 4 (four) hours as needed for severe pain.  120 tablet  0  . prochlorperazine (COMPAZINE) 10 MG tablet Take 10 mg by mouth every 6 (six) hours as needed for nausea or vomiting.      . sucralfate (CARAFATE) 1 GM/10ML suspension Place 10 mLs (1 g total) rectally 3 (three) times daily.  420 mL  2   No current facility-administered medications for this visit.     Review of Systems:     Cardiac Review of Systems: Y or N  Chest Pain [  n  ]  Resting SOB [ n   ] Exertional SOB  Blue.Reese  ]  Orthopnea [ y ]   Pedal Edema [  n ]    Palpitations [ n ] Syncope  [n  ]   Presyncope [n   ]  General Review of Systems: [Y] = yes [  ]=no Constitional: recent weight change [ n ];  Wt loss over the last 3 months [   ] anorexia [n  ]; fatigue [ y ]; nausea [n  ]; night sweats [n  ]; fever [  ]; or chillsn [  ];          Dental: poor dentition[  n]; Last Dentist visit:   Eye : blurred vision [ n ]; diplopia [   ]; vision changes [n  ];  Amaurosis fugax[  ]; Resp: cough [  ];  wheezing[ y ];  hemoptysis[ y ]; shortness of breath[y  ]; paroxysmal nocturnal dyspnea[y  ]; dyspnea on exertion[  y]; or orthopnea[  ];  GI:  gallstones[  ], vomiting[  ];  dysphagia[  ]; melena[  ];  hematochezia [  ]; heartburn[  ];   Hx of  Colonoscopy[  ]; GU: kidney stones [n  ]; hematuria[ n ];   dysuria [  ];  nocturia[n  ];  history of     obstruction [  ]; urinary frequency [  ]             Skin: rash, swelling[  ];, hair loss[  ];  peripheral edema[  ];  or itching[  ]; Musculosketetal: myalgias[  ];  joint swelling[  ];  joint erythema[  ];  joint pain[  ];  back pain[  ];  Heme/Lymph: bruising[  ];  bleeding[  ];  anemia[  ];  Neuro: TIA[ n ];  headaches[ n ];  stroke[  ];  vertigo[  ];  seizures[ n ];   paresthesias[  ];  difficulty walking[  ];  Psych:depression[  ]; anxiety[  ];  Endocrine: diabetes[  ];  thyroid dysfunction[  ];  Immunizations: Flu up to date [  n]; Pneumococcal up to date [  n];  Other:  Physical Exam: BP 107/60  Pulse 100  Resp 20  Ht 5' 9"  (1.753 m)  Wt 117 lb (53.071 kg)  BMI 17.27 kg/m2  SpO2 98%  PHYSICAL EXAMINATION:  General appearance: alert, cooperative, appears older than stated age, cachectic and no distress Neurologic: intact Heart: regular rate and rhythm, S1, S2 normal, no murmur, click, rub or gallop Lungs: diminished breath sounds bilaterally Abdomen: soft, non-tender; bowel sounds normal; no masses,  no organomegaly luq  colostomy Extremities: extremities normal, atraumatic, no cyanosis or edema and Homans sign is negative, no sign of DVT no cervical or axillary adenopthy   Diagnostic Studies & Laboratory data:     Recent Radiology Findings: CT chest abdomen pelvis: CLINICAL DATA: Right upper lobe lung cancer. Prior history of  rectal cancer.  EXAM:  CT CHEST, ABDOMEN, AND PELVIS WITH CONTRAST  TECHNIQUE:  Multidetector CT imaging of the chest, abdomen and pelvis was  performed following the standard protocol during bolus  administration of intravenous contrast.  CONTRAST: 155m OMNIPAQUE IOHEXOL 300 MG/ML SOLN  COMPARISON: PET-CT 12/21/2013. Chest CT 12/12/2013.  FINDINGS:  CT CHEST FINDINGS  Mediastinum: Heart size is normal. There is no significant  pericardial fluid, thickening or pericardial calcification. No  pathologically enlarged mediastinal or hilar lymph nodes. Mild  atherosclerosis throughout the thoracic aorta.  Esophagus is  unremarkable in appearance.  Lungs/Pleura: When compared to prior examinations, the previously  noted pleural-based mass in the periphery of the right upper lobe  has decreased in size, currently measuring 4.8 x 2.3 cm. 10 x 5 mm  pleural based nodule in the periphery of the left upper lobe (image  10 of series 4) corresponding to small focus of hypermetabolism on  recent PET-CT appears unchanged. There is also a partially calcified  pleural based nodular opacity in the medial aspect of the right  upper lobe measuring 1.2 x 1.0 cm (image 6 of series 4), which is  unchanged, and was not hypermetabolic on the recent PET-CT. No other  new suspicious appearing pulmonary nodules or masses are otherwise  noted. No acute consolidative airspace disease. No pleural  effusions. Moderate centrilobular emphysema, most pronounced  throughout the lung apices.  Musculoskeletal: When compared to prior examinations the osteolytic  changes in the lateral aspect of the right  third rib are for more  dramatic. No other aggressive appearing lytic or blastic lesions are  noted in the visualized portions of the skeleton.  CT ABDOMEN AND PELVIS FINDINGS  Abdomen/Pelvis: Several sub cm low-attenuation hepatic lesions are  noted on image 59, 60 and 61 in the left lobe of the liver. These  are nonspecific, but similar to prior study 03/05/2011, and  therefore favored to be benign, likely small cysts. The appearance  of the gallbladder, pancreas, spleen, bilateral adrenal glands and  left kidney is unremarkable. There is mild right  hydroureteronephrosis which extends to the level of the pelvis  adjacent to the sacrum, presumably secondary to mass effect or  traction and narrowing of the distal third of the right ureter  related to the presacral and perirectal process (see discussion  below). No significant volume of ascites. No pneumoperitoneum. No  pathologic distention of small bowel. Atherosclerotic calcifications  throughout the abdominal and pelvic vasculature, without evidence of  aneurysm.  Status post low anterior resection. There is a left upper quadrant  colostomy, with a moderate parastomal hernia which contains a  portion of the body/antrum of the stomach. Adjacent to the resection  site in the low anatomic pelvis there is a complex soft tissue, gas  and fluid containing mass in the perirectal and presacral region  which measures approximately 5.4 x 7.2 x 8.3 cm. This appears to  involve the rectal remnant, with extension posteriorly, overall  appearing to represent an inflammatory mass with multiple fistulae.  Although, the possibility of residual malignancy with cavitation of  a mass in this region is difficult to exclude.  Musculoskeletal: Areas of osteolysis in the sacrum adjacent to the  previously described presacral process. Two of these at the level of  S2 and S3 have well-defined margins with a sclerotic borders.  IMPRESSION:  1. Positive  response to therapy with slight regression of the  previously noted right upper lobe mass. Greater apparent bony  involvement in the lateral aspect of the right third rib may be  related to increasing invasion of the rib, or could be secondary to  evolving changes following recent radiation therapy.  2. Previously described small pleural-based hypermetabolic nodule in  the periphery of the left upper lobe (image 10 of series 4) is  unchanged. Continued attention on followup studies is recommended.  3. Status post low anterior resection for remote history of rectal  cancer with what appears to be an inflammatory mass in the  perirectal and presacral space is,  likely with associated chronic  osteomyelitis. The possibility of residual/recurrent malignancy in  this region is difficult to entirely exclude, but is not strongly  favored; however, correlation with recent biopsy results is  recommended.  4. Left upper quadrant colostomy with moderate-sized parastomal  hernia which contains a short portion of the body/antrum of the  stomach.  5. New mild right hydroureteronephrosis which is presumably related  to either mass effect or traction on the distal third of the right  ureter related to the previously described presacral process.  Urologic consultation may be appropriate if clinically indicated.  6. Additional incidental findings, as above.  Electronically Signed  By: Vinnie Langton M.D.  On: 03/21/2014 16:49    Randy Gould CM Contrast  01/09/2014   CLINICAL DATA:  Lung cancer.  Remote history of colon cancer.  EXAM: MRI HEAD WITHOUT AND WITH CONTRAST  TECHNIQUE: Multiplanar, multiecho pulse sequences of the brain and surrounding structures were obtained without and with intravenous contrast.  CONTRAST:  110m MULTIHANCE GADOBENATE DIMEGLUMINE 529 MG/ML IV SOLN  COMPARISON:  CT scan 12/21/2013.  FINDINGS: No acute infarct, hemorrhage, or mass lesion is present. Postcontrast images  demonstrate no pathologic enhancement to suggest metastatic disease of the brain or meninges. Midline structures are within normal limits.  Flow is present in the major intracranial arteries. The left lens has been replaced. The globes and orbits are otherwise intact. The a lateral recess of the right sphenoid sinus is opacified. The paranasal sinuses and mastoid air cells are otherwise clear.  IMPRESSION: 1. No evidence for metastatic disease the brain or meninges. 2. MRI appearance of the brain for age 73 Opacification of the lateral recess of the right sphenoid sinus.   Electronically Signed   By: CLawrence SantiagoM.D.   On: 01/09/2014 14:42     Ct Angio Chest Pe W/cm &/or Wo Cm  12/12/2013   CLINICAL DATA:  Right upper chest burning sensation or 3 weeks question pulmonary embolism, history colon cancer  EXAM: CT ANGIOGRAPHY CHEST WITH CONTRAST  TECHNIQUE: Multidetector CT imaging of the chest was performed using the standard protocol during bolus administration of intravenous contrast. Multiplanar CT image reconstructions including MIPs were obtained to evaluate the vascular anatomy.  CONTRAST:  855mOMNIPAQUE IOHEXOL 350 MG/ML SOLN  COMPARISON:  CT chest with contrast 03/05/2011  FINDINGS: Atherosclerotic calcifications aorta without aneurysm or dissection.  Pulmonary arteries patent.  No evidence of pulmonary embolism.  No thoracic adenopathy.  Nonspecific 6 mm low-attenuation focus last segment left lobe liver image 98 unchanged.  Remaining visualized portions of upper abdomen normal appearance.  Peripheral mass identified at the lateral aspect of the right upper hemithorax, contiguous with the pleural surface over a broad base, more confluent and mass-like in appearance versus the previous exam.  Finding is worrisome for a Pancoast tumor measuring 5.7 x 2.7 x 4.6 cm  Suspected associated bone destruction of the lateral aspect of the right 3rd rib.  Underlying COPD changes with calcified granuloma at the  right upper lobe.  Peripheral areas of scarring in both lungs predominantly upper lobes.  Subsegmental atelectasis right lower lobe.  Remaining lungs clear.  No pleural effusion or pneumothorax.  No additional osseous findings.  Review of the MIP images confirms the above findings.  IMPRESSION: No evidence of pulmonary embolism.  Peripheral tumor with broad pleural base at the lateral aspect of the right upper lobe 5.7 x 2.7 x 4.6 cm in size worrisome for a Pancoast tumor; recommend tissue diagnosis  with PET-CT imaging. .  Associated bone destruction of the lateral aspect of the right 3rd rib is suspected.  Severe underlying COPD and old granulomatous disease.   Electronically Signed   By: Lavonia Dana M.D.   On: 12/12/2013 15:25   Nm Pet Image Initial (pi) Skull Base To Thigh  12/21/2013   CLINICAL DATA:  Initial treatment strategy for lung mass. Prior history of colon cancer.  EXAM: NUCLEAR MEDICINE PET SKULL BASE TO THIGH  FASTING BLOOD GLUCOSE:  Value: 60m/dl  TECHNIQUE: 19.5 mCi F-18 FDG was injected intravenously. CT data was obtained and used for attenuation correction and anatomic localization only. (This was not acquired as a diagnostic CT examination.) Additional exam technical data entered on technologist worksheet.  COMPARISON:  CT ANGIO CHEST W/CM &/OR WO/CM dated 12/12/2013; DG CHEST 2 VIEW dated 12/08/2013; CT CHEST W/CM dated 03/05/2011  FINDINGS: NECK  No hypermetabolic lymph nodes in the neck.  CHEST  There is a hypermetabolic mass in the lateral right upper lobe measuring 5.1 x 2.7 cm with intense metabolic activity (SUV max 16.8). This mass shares a broad surface with the chest wall and appears to involve adjacent ribs. There is a small focus of hypermetabolic nodular pleural thickening in the left upper lobe measuring 7 mm (image 60) with mild metabolic activity (SUV max 2.6). There is extensive centrilobular emphysema with upper lobe predominance. No hypermetabolic mediastinal lymph nodes.   ABDOMEN/PELVIS  There is asymmetric hypermetabolic activity associated with the lower rectal region at site of prior surgical resection and surgical clips. Difficult to define the tissue planes at this level due to CT technique and lack of contrast. Hypermetabolic activity is seen on CT image 206 just anterior to the sacrum on the right. Second nodular focus of hypermetabolic activity in the deep pelvis just right of midline on image 194 without clear definable lesion on the CT portion. A thirdfocus of activity is noted medial to the left psoas muscle (image 183. This could represent ureteral activity. There is physiologic activity noted in the left lower quadrant colostomy. No abnormal metabolic activity within the liver or adrenal glands.  SKELETON  No focal hypermetabolic activity to suggest skeletal metastasis.  IMPRESSION: 1. Hypermetabolic right upper lobe mass the chest wall is concerning for bronchogenic carcinoma. Recommend tissue sampling. 2. Hypermetabolic mild nodular thickening in the left upper lobe is less specific and may be inflammatory. Recommend attention on follow-up. 3. No hypermetabolic mediastinal lymph nodes. 4. Asymmetric hypermetabolic activity in the deep right pelvis adjacent to the sacrum with differential including local recurrence versus potential deep pelvic infection or physiologic activity. Two additional foci of activity in the pelvis could represent local metastasis or physiologic activity. Recommend correlation with CEA and if elevated recommend contrast-enhanced CT of the pelvis for further evaluation.   Electronically Signed   By: SSuzy BouchardM.D.   On: 12/21/2013 16:20   Ct Biopsy  12/27/2013   CLINICAL DATA:  Right lung lesion.  Tissue diagnosis is needed.  EXAM: CT-GUIDED BIOPSY OF RIGHT LUNG LESION  Physician: AStephan Minister Henn, MD  MEDICATIONS: Versed 2 mg, fentanyl 50 mcg. A radiology nurse monitored the patient for moderate sedation.  ANESTHESIA/SEDATION: Sedation  time: 29 min  PROCEDURE: The procedure was explained to the patient. The risks and benefits of the procedure were discussed and the patient's questions were addressed. Informed consent was obtained from the patient. Patient was placed supine on the CT scanner. Images through the chest were obtained. The lesion in  the periphery of the right upper lobe was identified. The patient's right axilla was shaved. The right axilla was prepped and draped in sterile fashion. Skin was anesthetized with 1% lidocaine. Using CT guidance, a 17 gauge needle was directed into the peripheral lesion. Needle placement was confirmed within the lesion. A total of 3 core biopsies were performed with an 18 gauge core device. Two adequate specimens were obtained. Samples were placed in formalin. 17 gauge needle was removed without complication. A small bandage was placed over the puncture site.  COMPLICATIONS: None  FINDINGS: Pleural based lesion in the lateral right upper lobe. There is mild irregularity of the adjacent right third rib which may be contributing to the patient's symptoms in this area. Patient has diffuse centrilobular emphysema. Needle placement confirmed within the lesion. No evidence for a pneumothorax following the core biopsies.  IMPRESSION: CT-guided core biopsies of the right lung mass.   Electronically Signed   By: Markus Daft M.D.   On: 12/27/2013 13:56    PATH: Diagnosis Lung, needle/core biopsy(ies), Right upper lobe - ADENOCARCINOMA. - SEE COMMENT. Microscopic Comment Dr. Lynnell Chad has reviewed the case and concurs with this interpretation. EGFR and ALK  Both negative   Recent Lab Findings: Lab Results  Component Value Date   WBC 12.9* 04/15/2014   HGB 8.1* 04/15/2014   HCT 25.3* 04/15/2014   PLT 679* 04/15/2014   GLUCOSE 89 04/15/2014   ALT 9 04/13/2014   AST 15 04/13/2014   NA 138 04/15/2014   K 3.7 04/15/2014   CL 101 04/15/2014   CREATININE 0.85 04/15/2014   BUN 8 04/15/2014   CO2 22 04/15/2014    INR 0.99 12/27/2013   PFT's: FEV1 2.47  84%   DLCO 21.37   72%   Assessment / Plan:   1. 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      2. There is a small focus of hypermetabolic nodular pleural thickening in the left upper lobe       measuring 7 mm (image 60) with mild metabolic activity (SUV max        2.6). This appears unchanged from a CT scan 2012  3. extensive centrilobular emphysema with upper lobe predominance, the patient's pulmonary function studies are noted above. He has remained off cigarettes   4 Extensive mass in pelvis,/ inflammatory  Patient appears frail, I have discussed with him poss lung and chest wall resection, with his frail status and recent admission "failure to thive" The patient does not wish and I agree with him to NOT pursue lung and chest wall resection. He does need nutational, PT and social work support through he [place] center. He will see Dr [name] to consider completion of radiation dose to right chest wall lesion. He will have appointment to [facility] next  week .    Grace Isaac Md     [address] [place] [address] Office [phone]   Beeper [phone]

## 2014-04-19 NOTE — Telephone Encounter (Signed)
Randy Gould has called  requesting a refill for Oxycodone for pelvic pain.  Dr. Servando Snare had initially prescribed this for him, but then referred him to general surgery to evaluate a pelvic mass.  Dr. Leighton Ruff prescribed Oxycodone #120 on 04/03/14.  I told him he should contact her to refill his pain med.  He understood.

## 2014-04-19 NOTE — [facility] Oncology Navigator Note (Unsigned)
Emailed [facility] coordinator, Margretta Ditty to help facilitate resources identified by Dr. Servando Snare.

## 2014-04-20 ENCOUNTER — Telehealth (INDEPENDENT_AMBULATORY_CARE_PROVIDER_SITE_OTHER): Payer: Self-pay

## 2014-04-20 ENCOUNTER — Other Ambulatory Visit: Payer: Self-pay | Admitting: *Deleted

## 2014-04-20 ENCOUNTER — Telehealth: Payer: Self-pay | Admitting: *Deleted

## 2014-04-20 ENCOUNTER — Encounter: Payer: Self-pay | Admitting: *Deleted

## 2014-04-20 DIAGNOSIS — C3490 Malignant neoplasm of unspecified part of unspecified bronchus or lung: Secondary | ICD-10-CM

## 2014-04-20 DIAGNOSIS — C3410 Malignant neoplasm of upper lobe, unspecified bronchus or lung: Secondary | ICD-10-CM

## 2014-04-20 MED ORDER — OXYCODONE HCL 5 MG PO TABS: 5.0000 mg | ORAL_TABLET | ORAL | Status: DC | PRN

## 2014-04-20 NOTE — Telephone Encounter (Signed)
I spoke with Randy Gould, CSW regarding Randy Gould concerns.  She gave me suggestions to help Randy Gould.  I called and spoke with Randy Gould to check about needs.  She stated Randy Gould is weak and hurting at times.  Over the phone it seems the Randy Gould could utilize support in the home.  I called and spoke to PCP office.  They tried to call Randy Gould twice last week to have him come back for follow up. They were unable to reach patient.  I paged Dr. Servando Snare.  He called and we discussed options for Randy Gould.  I will call patient and speak to him about purposed options and then go forward with plan.

## 2014-04-20 NOTE — Telephone Encounter (Signed)
Notified Patient to inform him DR. Tharon Aquas Trigt has reordered oxycodone he would need to call his office . Patient verbalized understanding

## 2014-04-20 NOTE — Telephone Encounter (Signed)
Called Randy Gould again today.  I am concerned about his wife's condition.  She fell and thinks she has broken her hip.  She stated she is peeing in a jar.  I instructed him to call 911 and get her to hospital.  She is in a lot of pain and immobile for several days.  I explained that the risks of not seeking treatment for his wife.  He verbalized understanding.

## 2014-04-20 NOTE — Telephone Encounter (Signed)
Called patient back and we discussed nurse coming to see pt at the home for support.  He is will to take any support he could get.  I will order [facility] to see pt per Dr. Servando Snare.  I will also call [facility] to let them know more of pt needs.

## 2014-04-20 NOTE — Telephone Encounter (Signed)
Pt's wife is calling to get a refill on Oxycodone 5mg . Pt states his pain is a 10 out of 10. Advised pt that I would send Dr Marcello Moores a message and of Rx is refilled they would need to pick up Rx in our office.

## 2014-04-20 NOTE — [facility] Oncology Navigator Note (Unsigned)
Randy Gould.  She stated she has received the order.  I also, updated her on pt's and wife's condition.

## 2014-04-23 ENCOUNTER — Telehealth: Payer: Self-pay | Admitting: *Deleted

## 2014-04-23 NOTE — Telephone Encounter (Signed)
Called to follow up with pt and wife.  Wife was admitted to hospital.  Pt stated she was doing ok.  I arranged an appt for pt at [facility] 03/27/14 at 3:00 arrival and see Dr. [name] at 3:30.  He verbalized understanding of appt time and place.

## 2014-04-24 ENCOUNTER — Telehealth: Payer: Self-pay | Admitting: *Deleted

## 2014-04-24 NOTE — Telephone Encounter (Signed)
Tried to call patient to follow up.  Unable to leave vm message on mobil phone.

## 2014-04-26 ENCOUNTER — Encounter: Payer: Self-pay | Admitting: Radiation Oncology

## 2014-04-26 ENCOUNTER — Telehealth: Payer: Self-pay | Admitting: *Deleted

## 2014-04-26 ENCOUNTER — Ambulatory Visit
Admission: RE | Admit: 2014-04-26 | Discharge: 2014-04-26 | Disposition: A | Discharge status: Home / Self Care | Payer: Medicare Other | Source: Ambulatory Visit | Attending: Radiation Oncology | Admitting: Radiation Oncology

## 2014-04-26 VITALS — BP 135/75 | HR 97 | Temp 97.1°F | Resp 18 | Ht 69.5 in | Wt 119.2 lb

## 2014-04-26 DIAGNOSIS — C3410 Malignant neoplasm of upper lobe, unspecified bronchus or lung: Secondary | ICD-10-CM

## 2014-04-26 NOTE — Progress Notes (Signed)
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  Since he has lost weight and been suffering with UTI, pelvic pain, and rectal fistula, he was deemed surgically unresectable.  He is now under consideration for a radiation boost.  He has new onset severe left flank pain.                         ALLERGIES:  is allergic to ativan and other.  Meds: Current Outpatient Prescriptions  Medication Sig Dispense Refill  . acetaminophen (TYLENOL) 650 MG CR tablet Take 650 mg by mouth every 4 (four) hours as needed for pain.       Marland Kitchen esomeprazole (NEXIUM) 20 MG capsule Take 20 mg by mouth daily as needed (for acid reflex).       Marland Kitchen oxyCODONE (OXY IR/ROXICODONE) 5 MG immediate release tablet Take 1-2 tablets (5-10 mg total) by mouth every 4 (four) hours as needed for severe pain.  120 tablet  0  . prochlorperazine (COMPAZINE) 10 MG tablet Take 10 mg by mouth every 6 (six) hours as needed for nausea or vomiting.      . sucralfate (CARAFATE) 1 GM/10ML suspension Place 10 mLs (1 g total) rectally 3 (three) times daily.  420 mL  2   No current facility-administered medications for this encounter.    Physical Findings: The patient is in no acute distress. Patient is alert and oriented.  height is 5' 9.5" (1.765 m) and weight is 119 lb 3.2 oz (54.069 kg). His temperature is 97.1 F (36.2 C). His blood pressure is 135/75 and his pulse is 97. His respiration is 18. Marland Kitchen He localizes pain to the left flank.  No significant changes.  Lab Findings: Lab Results  Component Value Date   WBC 12.9*  04/15/2014   HGB 8.1* 04/15/2014   HCT 25.3* 04/15/2014   MCV 81.4 04/15/2014   PLT 679* 04/15/2014    @LASTCHEM @  Radiographic Findings: Dg Chest 2 View  04/19/2014   CLINICAL DATA:  Adenocarcinoma of the right upper lobe, followup  EXAM: CHEST  2 VIEW  COMPARISON:  CT chest of 03/05/2011, and chest x-ray of 04/13/2014  FINDINGS: The peripheral right upper lobe lesion again is noted with adjacent pleural thickening. There does appear to be destruction of the lateral right second and third ribs presumably by tumor invasion. This bone destruction is new when compared to the prior CT. The lungs remain very hyperaerated consistent with severe emphysema. No infiltrate or effusion is seen. Mediastinal contours appear stable and the heart is within normal limits in size. The thoracic vertebrae remain unchanged in alignment.  IMPRESSION: 1. No change in peripheral soft tissue mass within the right upper lobe with adjacent bone destruction of the right lateral second and third ribs. 2. Severe emphysema.   Electronically Signed   By: Ivar Drape M.D.   On: 04/19/2014 09:52   Dg Abd Acute W/chest  04/13/2014   CLINICAL DATA:  Abdominal pain and nausea.  EXAM: ACUTE ABDOMEN SERIES (ABDOMEN 2 VIEW & CHEST  1 VIEW)  COMPARISON:  CT chest, abdomen and pelvis 03/21/2014. Chest radiograph 12/27/2013.  FINDINGS: The cardiomediastinal silhouette is within normal limits. The lungs remain hyperinflated. Peripheral right upper lobe mass is again seen and better evaluated on recent chest CT. Interstitial markings are mildly prominent bilaterally, similar to the prior study. There is no evidence of new airspace consolidation, overt edema, pleural effusion, or pneumothorax.  There is no evidence of intraperitoneal free air. No significant air-fluid levels are identified. Gas is present in nondilated colon. There is a left sided colostomy. There is a paucity of small bowel gas. Surgical clips are present in the pelvis. No acute  osseous abnormality is identified.  IMPRESSION: 1. Peripheral right upper lobe mass. No evidence of acute airspace disease. 2. Nonspecific bowel gas pattern with paucity of small bowel gas.   Electronically Signed   By: Gould Bores   On: 04/13/2014 12:37    Impression:  The patient is ready to complete definitive radiation to the lung tumor.  He also has flank pain but, is afebrile.  Plan:  We will proceed with CT simulation tomorrow and assess flank pain on simulation imaging.  _____________________________________  Sheral Apley. [name], M.D.

## 2014-04-26 NOTE — Telephone Encounter (Signed)
Called pt to check on him.  He is in the [facility] waiting room.  Will notify tech and Dr. [name].

## 2014-04-27 ENCOUNTER — Encounter: Payer: Self-pay | Admitting: Radiation Oncology

## 2014-04-27 ENCOUNTER — Other Ambulatory Visit: Payer: Self-pay | Admitting: *Deleted

## 2014-04-27 ENCOUNTER — Inpatient Hospital Stay (HOSPITAL_COMMUNITY): Payer: Medicare Other

## 2014-04-27 ENCOUNTER — Ambulatory Visit
Admission: RE | Admit: 2014-04-27 | Discharge: 2014-04-27 | Disposition: A | Discharge status: Home / Self Care | Payer: Medicare Other | Source: Ambulatory Visit | Attending: Radiation Oncology | Admitting: Radiation Oncology

## 2014-04-27 ENCOUNTER — Other Ambulatory Visit (HOSPITAL_COMMUNITY): Payer: Medicare Other

## 2014-04-27 ENCOUNTER — Inpatient Hospital Stay (HOSPITAL_COMMUNITY)
Admission: EM | Admit: 2014-04-27 | Discharge: 2014-04-30 | DRG: 659 | Disposition: A | Discharge status: Hospice | Payer: Medicare Other | Attending: Internal Medicine | Admitting: Internal Medicine

## 2014-04-27 ENCOUNTER — Ambulatory Visit (HOSPITAL_COMMUNITY): Payer: Medicare Other

## 2014-04-27 ENCOUNTER — Ambulatory Visit (HOSPITAL_COMMUNITY)
Admission: RE | Admit: 2014-04-27 | Discharge: 2014-04-27 | Disposition: A | Discharge status: Home / Self Care | Payer: Medicare Other | Source: Ambulatory Visit | Attending: Radiation Oncology | Admitting: Radiation Oncology

## 2014-04-27 ENCOUNTER — Encounter (HOSPITAL_COMMUNITY): Payer: Self-pay

## 2014-04-27 ENCOUNTER — Encounter (HOSPITAL_COMMUNITY): Payer: Self-pay | Admitting: Emergency Medicine

## 2014-04-27 VITALS — BP 160/78 | HR 75 | Temp 97.8°F | Resp 16 | Wt 119.2 lb

## 2014-04-27 DIAGNOSIS — E860 Dehydration: Secondary | ICD-10-CM

## 2014-04-27 DIAGNOSIS — R627 Adult failure to thrive: Secondary | ICD-10-CM | POA: Diagnosis present

## 2014-04-27 DIAGNOSIS — Z933 Colostomy status: Secondary | ICD-10-CM

## 2014-04-27 DIAGNOSIS — N1330 Unspecified hydronephrosis: Principal | ICD-10-CM | POA: Diagnosis present

## 2014-04-27 DIAGNOSIS — R109 Unspecified abdominal pain: Secondary | ICD-10-CM

## 2014-04-27 DIAGNOSIS — Z510 Encounter for antineoplastic radiation therapy: Secondary | ICD-10-CM | POA: Insufficient documentation

## 2014-04-27 DIAGNOSIS — Z66 Do not resuscitate: Secondary | ICD-10-CM | POA: Diagnosis present

## 2014-04-27 DIAGNOSIS — J4489 Other specified chronic obstructive pulmonary disease: Secondary | ICD-10-CM | POA: Diagnosis present

## 2014-04-27 DIAGNOSIS — D649 Anemia, unspecified: Secondary | ICD-10-CM

## 2014-04-27 DIAGNOSIS — K603 Anal fistula, unspecified: Secondary | ICD-10-CM | POA: Diagnosis present

## 2014-04-27 DIAGNOSIS — M533 Sacrococcygeal disorders, not elsewhere classified: Secondary | ICD-10-CM

## 2014-04-27 DIAGNOSIS — N390 Urinary tract infection, site not specified: Secondary | ICD-10-CM

## 2014-04-27 DIAGNOSIS — Z923 Personal history of irradiation: Secondary | ICD-10-CM

## 2014-04-27 DIAGNOSIS — R222 Localized swelling, mass and lump, trunk: Secondary | ICD-10-CM

## 2014-04-27 DIAGNOSIS — Z515 Encounter for palliative care: Secondary | ICD-10-CM

## 2014-04-27 DIAGNOSIS — N139 Obstructive and reflux uropathy, unspecified: Secondary | ICD-10-CM | POA: Diagnosis present

## 2014-04-27 DIAGNOSIS — C3410 Malignant neoplasm of upper lobe, unspecified bronchus or lung: Secondary | ICD-10-CM

## 2014-04-27 DIAGNOSIS — R1900 Intra-abdominal and pelvic swelling, mass and lump, unspecified site: Secondary | ICD-10-CM | POA: Diagnosis present

## 2014-04-27 DIAGNOSIS — R531 Weakness: Secondary | ICD-10-CM

## 2014-04-27 DIAGNOSIS — E876 Hypokalemia: Secondary | ICD-10-CM | POA: Diagnosis present

## 2014-04-27 DIAGNOSIS — R079 Chest pain, unspecified: Secondary | ICD-10-CM

## 2014-04-27 DIAGNOSIS — Z87891 Personal history of nicotine dependence: Secondary | ICD-10-CM

## 2014-04-27 DIAGNOSIS — Z85048 Personal history of other malignant neoplasm of rectum, rectosigmoid junction, and anus: Secondary | ICD-10-CM

## 2014-04-27 DIAGNOSIS — Z85038 Personal history of other malignant neoplasm of large intestine: Secondary | ICD-10-CM

## 2014-04-27 DIAGNOSIS — J449 Chronic obstructive pulmonary disease, unspecified: Secondary | ICD-10-CM | POA: Diagnosis present

## 2014-04-27 DIAGNOSIS — K219 Gastro-esophageal reflux disease without esophagitis: Secondary | ICD-10-CM | POA: Diagnosis present

## 2014-04-27 DIAGNOSIS — Z79899 Other long term (current) drug therapy: Secondary | ICD-10-CM

## 2014-04-27 DIAGNOSIS — N179 Acute kidney failure, unspecified: Secondary | ICD-10-CM | POA: Diagnosis present

## 2014-04-27 DIAGNOSIS — Z681 Body mass index (BMI) 19 or less, adult: Secondary | ICD-10-CM

## 2014-04-27 DIAGNOSIS — D638 Anemia in other chronic diseases classified elsewhere: Secondary | ICD-10-CM | POA: Diagnosis present

## 2014-04-27 DIAGNOSIS — E43 Unspecified severe protein-calorie malnutrition: Secondary | ICD-10-CM | POA: Diagnosis present

## 2014-04-27 LAB — BASIC METABOLIC PANEL
BUN: 45 mg/dL — ABNORMAL HIGH (ref 6–23)
CALCIUM: 8.4 mg/dL (ref 8.4–10.5)
CHLORIDE: 96 meq/L (ref 96–112)
CO2: 18 meq/L — AB (ref 19–32)
CREATININE: 6.74 mg/dL — AB (ref 0.50–1.35)
GFR calc Af Amer: 8 mL/min — ABNORMAL LOW (ref >90)
GFR calc non Af Amer: 7 mL/min — ABNORMAL LOW (ref >90)
GLUCOSE: 90 mg/dL (ref 70–99)
Potassium: 3.4 mEq/L — ABNORMAL LOW (ref 3.7–5.3)
SODIUM: 135 meq/L — AB (ref 137–147)

## 2014-04-27 LAB — URINALYSIS, ROUTINE W REFLEX MICROSCOPIC
BILIRUBIN URINE: NEGATIVE
Glucose, UA: NEGATIVE mg/dL
Hgb urine dipstick: NEGATIVE
Ketones, ur: NEGATIVE mg/dL
Leukocytes, UA: NEGATIVE
Nitrite: NEGATIVE
Protein, ur: NEGATIVE mg/dL
Specific Gravity, Urine: 1.015 (ref 1.005–1.030)
UROBILINOGEN UA: 0.2 mg/dL (ref 0.0–1.0)
pH: 5 (ref 5.0–8.0)

## 2014-04-27 LAB — CBC WITH DIFFERENTIAL/PLATELET
Basophils Absolute: 0 10*3/uL (ref 0.0–0.1)
Basophils Relative: 0 % (ref 0–1)
Eosinophils Absolute: 0.1 10*3/uL (ref 0.0–0.7)
Eosinophils Relative: 1 % (ref 0–5)
HEMATOCRIT: 26.1 % — AB (ref 39.0–52.0)
Hemoglobin: 8.7 g/dL — ABNORMAL LOW (ref 13.0–17.0)
LYMPHS ABS: 1.3 10*3/uL (ref 0.7–4.0)
LYMPHS PCT: 13 % (ref 12–46)
MCH: 26 pg (ref 26.0–34.0)
MCHC: 33.3 g/dL (ref 30.0–36.0)
MCV: 77.9 fL — ABNORMAL LOW (ref 78.0–100.0)
MONO ABS: 0.5 10*3/uL (ref 0.1–1.0)
Monocytes Relative: 5 % (ref 3–12)
Neutro Abs: 7.8 10*3/uL — ABNORMAL HIGH (ref 1.7–7.7)
Neutrophils Relative %: 81 % — ABNORMAL HIGH (ref 43–77)
RBC: 3.35 MIL/uL — AB (ref 4.22–5.81)
RDW: 20.2 % — ABNORMAL HIGH (ref 11.5–15.5)
WBC: 9.6 10*3/uL (ref 4.0–10.5)

## 2014-04-27 LAB — I-STAT CHEM 8, ED
BUN: 42 mg/dL — AB (ref 6–23)
Calcium, Ion: 1.1 mmol/L — ABNORMAL LOW (ref 1.13–1.30)
Chloride: 102 mEq/L (ref 96–112)
Creatinine, Ser: 7 mg/dL — ABNORMAL HIGH (ref 0.50–1.35)
GLUCOSE: 86 mg/dL (ref 70–99)
HCT: 24 % — ABNORMAL LOW (ref 39.0–52.0)
Hemoglobin: 8.2 g/dL — ABNORMAL LOW (ref 13.0–17.0)
POTASSIUM: 3.3 meq/L — AB (ref 3.7–5.3)
Sodium: 138 mEq/L (ref 137–147)
TCO2: 19 mmol/L (ref 0–100)

## 2014-04-27 LAB — PROTIME-INR
INR: 1.12 (ref 0.00–1.49)
Prothrombin Time: 14.2 seconds (ref 11.6–15.2)

## 2014-04-27 MED ORDER — POTASSIUM CHLORIDE CRYS ER 20 MEQ PO TBCR: 40.0000 meq | EXTENDED_RELEASE_TABLET | Freq: Once | ORAL | Status: DC

## 2014-04-27 MED ORDER — ONDANSETRON HCL 4 MG/2ML IJ SOLN
4.0000 mg | Freq: Four times a day (QID) | INTRAMUSCULAR | Status: DC | PRN
Start: 2014-04-27 — End: 2014-04-30

## 2014-04-27 MED ORDER — MIDAZOLAM HCL 2 MG/2ML IJ SOLN
INTRAMUSCULAR | Status: AC
  Filled 2014-04-27: qty 4

## 2014-04-27 MED ORDER — SODIUM CHLORIDE 0.9 % IV SOLN
INTRAVENOUS | Status: DC
  Administered 2014-04-28 (×2): via INTRAVENOUS

## 2014-04-27 MED ORDER — MORPHINE SULFATE 4 MG/ML IJ SOLN
8.0000 mg | Freq: Once | INTRAMUSCULAR | Status: AC
  Administered 2014-04-27: 8 mg via INTRAVENOUS
  Filled 2014-04-27: qty 2

## 2014-04-27 MED ORDER — PANTOPRAZOLE SODIUM 40 MG PO TBEC
40.0000 mg | DELAYED_RELEASE_TABLET | Freq: Every day | ORAL | Status: DC
  Administered 2014-04-28 – 2014-04-30 (×4): 40 mg via ORAL
  Filled 2014-04-27 (×4): qty 1

## 2014-04-27 MED ORDER — SODIUM CHLORIDE 0.9 % IV SOLN
1000.0000 mL | Freq: Once | INTRAVENOUS | Status: AC
  Administered 2014-04-27: 1000 mL via INTRAVENOUS

## 2014-04-27 MED ORDER — HYDROMORPHONE HCL PF 1 MG/ML IJ SOLN
1.0000 mg | INTRAMUSCULAR | Status: DC | PRN
  Administered 2014-04-28 – 2014-04-29 (×6): 1 mg via INTRAVENOUS
  Filled 2014-04-27 (×6): qty 1

## 2014-04-27 MED ORDER — OXYCODONE HCL 5 MG PO TABS: 5.0000 mg | ORAL_TABLET | ORAL | Status: DC | PRN

## 2014-04-27 MED ORDER — HEPARIN SODIUM (PORCINE) 5000 UNIT/ML IJ SOLN
5000.0000 [IU] | Freq: Three times a day (TID) | INTRAMUSCULAR | Status: DC
  Administered 2014-04-28 – 2014-04-29 (×5): 5000 [IU] via SUBCUTANEOUS
  Filled 2014-04-27 (×11): qty 1

## 2014-04-27 MED ORDER — IOHEXOL 300 MG/ML  SOLN
100.0000 mL | Freq: Once | INTRAMUSCULAR | Status: AC | PRN
  Administered 2014-04-27: 100 mL via INTRAVENOUS

## 2014-04-27 MED ORDER — MIDAZOLAM HCL 2 MG/2ML IJ SOLN
INTRAMUSCULAR | Status: AC | PRN
  Administered 2014-04-27: 2 mg via INTRAVENOUS
  Administered 2014-04-27: 0.5 mg via INTRAVENOUS

## 2014-04-27 MED ORDER — IOHEXOL 300 MG/ML  SOLN
25.0000 mL | Freq: Once | INTRAMUSCULAR | Status: AC | PRN
  Administered 2014-04-27: 25 mL via ORAL

## 2014-04-27 MED ORDER — MORPHINE SULFATE 4 MG/ML IJ SOLN
6.0000 mg | Freq: Once | INTRAMUSCULAR | Status: AC
  Administered 2014-04-27: 6 mg via INTRAVENOUS
  Filled 2014-04-27: qty 2

## 2014-04-27 MED ORDER — IOHEXOL 300 MG/ML  SOLN
50.0000 mL | Freq: Once | INTRAMUSCULAR | Status: AC | PRN
  Administered 2014-04-27: 35 mL via INTRAVENOUS

## 2014-04-27 MED ORDER — CEFAZOLIN SODIUM-DEXTROSE 2-3 GM-% IV SOLR
INTRAVENOUS | Status: AC
  Administered 2014-04-27: 2000 mg
  Filled 2014-04-27: qty 50

## 2014-04-27 MED ORDER — ONDANSETRON HCL 4 MG PO TABS
4.0000 mg | ORAL_TABLET | Freq: Four times a day (QID) | ORAL | Status: DC | PRN
Start: 2014-04-27 — End: 2014-04-30

## 2014-04-27 MED ORDER — SUCRALFATE 1 GM/10ML PO SUSP
1.0000 g | Freq: Three times a day (TID) | ORAL | Status: DC
  Administered 2014-04-28 – 2014-04-29 (×2): 1 g via RECTAL
  Filled 2014-04-27 (×10): qty 10

## 2014-04-27 MED ORDER — SODIUM CHLORIDE 0.9 % IJ SOLN
3.0000 mL | Freq: Two times a day (BID) | INTRAMUSCULAR | Status: DC
  Administered 2014-04-28 – 2014-04-30 (×3): 3 mL via INTRAVENOUS

## 2014-04-27 MED ORDER — FENTANYL CITRATE 0.05 MG/ML IJ SOLN
INTRAMUSCULAR | Status: AC
  Filled 2014-04-27: qty 4

## 2014-04-27 MED ORDER — SODIUM CHLORIDE 0.9 % IV SOLN
1000.0000 mL | INTRAVENOUS | Status: DC
  Administered 2014-04-27: 1000 mL via INTRAVENOUS

## 2014-04-27 MED ORDER — FENTANYL CITRATE 0.05 MG/ML IJ SOLN
INTRAMUSCULAR | Status: AC | PRN
  Administered 2014-04-27 (×2): 50 ug via INTRAVENOUS

## 2014-04-27 MED ORDER — ONDANSETRON HCL 4 MG/2ML IJ SOLN
4.0000 mg | Freq: Once | INTRAMUSCULAR | Status: AC
  Administered 2014-04-27: 4 mg via INTRAVENOUS
  Filled 2014-04-27: qty 2

## 2014-04-27 MED ORDER — OXYCODONE HCL 5 MG PO TABS
5.0000 mg | ORAL_TABLET | ORAL | Status: DC | PRN
  Administered 2014-04-28: 10 mg via ORAL
  Filled 2014-04-27: qty 2

## 2014-04-27 NOTE — Sedation Documentation (Signed)
Pt placed on o2 at 2L [place]

## 2014-04-27 NOTE — Progress Notes (Signed)
Reports taking compazine to manage nausea and vomiting. Reports he has a poor appetite. Reports left side and right shoulder pain 10 on a scale of 0-10. Reports left side pain is worse with movement. Reports sacral pain is 1 on a scale of 0-10. Reports taking oxy IR to take the edge off his pain. Patient look weak and fragile today. BP slightly elevated. Reports he is now caring for his wife who broke her tail bone while caring for him in the hospital.

## 2014-04-27 NOTE — Procedures (Signed)
Interventional Radiology Procedure Note  Procedure:  Placement of bilateral 32F nephrostomies Complications: None Recommendations: - Maintain to bag drainage - Follow Cr  Signed,  Criselda Peaches, MD [facility]

## 2014-04-27 NOTE — Progress Notes (Signed)
  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  Identity was confirmed.  All relevant records and images related to the planned course of therapy were reviewed.  The patient freely provided informed written consent to proceed with treatment after reviewing the details related to the planned course of therapy. The consent form was witnessed and verified by the simulation staff.  Then, the patient was set-up in a stable reproducible  supine position for radiation therapy.  CT images were obtained.  Surface markings were placed.  The CT images were loaded into the planning software.  Then the target and avoidance structures were contoured.  Treatment planning then occurred.  The radiation prescription was entered and confirmed.  Then, I designed and supervised the construction of a total of 6 medically necessary complex treatment devices in the form of one BodyFix custom molded cushion and 5 MLC apertures to cover the tumor and shield nearby critical organs including the .  I have requested : 3D Simulation  I have requested a DVH of the following structures: target, lungs, chestwall, brachial plexus and spinal cord.  I have ordered:Nutrition Consult  PLAN:  The patient will receive 30 Gy in 10 fractions.  ________________________________  Sheral Apley [name], M.D.    Addendum: Patient complained of left flank pain of 10/10 today, and is under treatment for a UTI with Abx.  We placed a BB on his area of pain on planning CT and found that his pain appears to be coming from an inflammatory process in the left flank  associated with perinephric stranding and a retroperitoneal inflammatory process.  This was not present on diagnostic CT on 4/15.  He is afebrile currently.  I will order repeat diagnostic CT abdomen/pelvis ASAP and go from there.

## 2014-04-27 NOTE — Progress Notes (Signed)
If patient is admitted, I would suggest palliative care consult.  Depending on his progress, I would recommend hospice at this time, but, this may be a consideration depending on his progress with the current admission.

## 2014-04-27 NOTE — ED Notes (Signed)
Pt states he was sent by Dr. [name] to have a "catheter placed in my kidney." Pt states he has inflammation around his L kidney. Pt states he is able to urinate without difficulty. Pt c/o L flank pain for the past 2 days. Pt alert, no acute distress. Skin warm and dry.

## 2014-04-27 NOTE — Progress Notes (Unsigned)
  Radiation Oncology         (336) [phone] ________________________________  Name: Randy Gould MRN: [id number]  Date: 04/27/2014  DOB: [birth year]  Chart Note:  I reviewed this patient's CT with Dr. Rosana Hoes from radiology.  He has high-grade bilateral hydroureteronephrosis, being produced by the complex mass developing in his pelvis. 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 Recurrent rectal cancer is unlikely 20 years after his treatment, but remains in the differential diagnosis.  Due to a risk of renal failure, I telephoned the patient and advised him to proceed to [facility] long emergency room. I then telephoned the emergency room attending physician to relay background clinical information.  At this point, I think the patient needs updated BUN and creatinine to assess for acute renal failure along with consultation for possible percutaneous nephrostomy tubes. With regard to his pelvic mass, patient may benefit from pelvic MRI to better delineate possible sacral osteomyelitis and better define the pelvic mass for further management. The patient may benefit from an infectious disease consultation.  If the patient can be stabilized, our tentative plan is to proceed with 2 weeks of completion of radiation for his lung cancer, followed by possible hyperbaric oxygen treatment to help with his pelvic wound healing status post previous pelvic radiotherapy 20 years ago.  ________________________________  Sheral Apley. [name], M.D.

## 2014-04-27 NOTE — ED Provider Notes (Signed)
CSN: [id number]     Arrival date & time 04/27/14  1624 History   First MD Initiated Contact with Patient 04/27/14 1634     Chief Complaint  Patient presents with  . Pyelonephritis     HPI Patient is currently undergoing treatment for his lung cancer.  He presents today from radiation oncology clinic.  He presented there because of worsening left flank pain.  CT of his abdomen and pelvis was performed demonstrating bilateral hydronephrosis and new worsening sacral mass with infectious versus inflammatory process.  Patient reports she's had worsening left flank pain over the past several days as well as nausea vomiting.  Decreased oral intake.  No urinary complaints.  Does report dark urine.  No diarrhea.  No chest pain shortness of breath.   Past Medical History  Diagnosis Date  . Emphysema/COPD   . Rectal pain   . Colostomy in place   . Proctitis   . Hypotonic bladder   . Incomplete emptying of bladder   . History of malignant neoplasm of rectum     1996-- (T3 N1)   S/P  RESECTION WITH COLOSTOMY/   CHEMORADATION THERAPY  . Pelvic mass   . History of radiation therapy     RIGHT UPPER LUNG LOBE--   01-30-2014  to  03-07-2014  (45 gray)  . GERD (gastroesophageal reflux disease)   . Wears glasses   . Wears dentures   . Adenocarcinoma of lung     RIGHT UPPER LOBE (stage IIB , T2  N0, M0)--  DX JAN 2015  S/P  RADIATION TX  (COMPLETE  03-07-2014)   Past Surgical History  Procedure Laterality Date  . Nasal septum surgery  1972  . Exploratory laparotomy /  lysis adhesions  AUG  1997    CHRONIC BOWEL OBSTRUCTION  . Small bowel resection and repair colostomy  JAN  2000  . Colonoscopy w/ polypectomy  03-16-2014  . Low anterior resection with primary anastomosis/  trnasverse loop colostomy with division  11-10-1995  . Examination under anesthesia N/A 04/09/2014    Procedure: EXAM UNDER ANESTHESIA, FLEX SIGMOIDOSCOPYM POSSIBLE BIOPSY;  Surgeon: Leighton Ruff, MD;  Location: [facility];  Service: General;  Laterality: N/A;   Family History  Problem Relation Age of Onset  . Stomach cancer Mother   . Colon cancer Neg Hx    History  Substance Use Topics  . Smoking status: Former Smoker -- 1.00 packs/day for 30 years    Types: Cigarettes    Quit date: 01/10/2014  . Smokeless tobacco: Never Used  . Alcohol Use: No     Comment: pt denies    Review of Systems  All other systems reviewed and are negative.     Allergies  Ativan and Other  Home Medications   Prior to Admission medications   Medication Sig Start Date End Date Taking? Authorizing Provider  acetaminophen (TYLENOL) 650 MG CR tablet Take 650 mg by mouth every 4 (four) hours as needed for pain.    Yes Historical Provider, MD  esomeprazole (NEXIUM) 20 MG capsule Take 20 mg by mouth daily as needed (for acid reflex).    Yes Historical Provider, MD  oxyCODONE (OXY IR/ROXICODONE) 5 MG immediate release tablet Take 1-2 tablets (5-10 mg total) by mouth every 4 (four) hours as needed for severe pain. 04/20/14  Yes Ivin Poot, MD  prochlorperazine (COMPAZINE) 10 MG tablet Take 10 mg by mouth every 6 (six) hours as needed for nausea or vomiting.  Yes Historical Provider, MD  sucralfate (CARAFATE) 1 GM/10ML suspension Place 10 mLs (1 g total) rectally 3 (three) times daily. 05/11/77  Yes Leighton Ruff, MD   BP 150/70  Pulse 74  Temp(Src) 98.1 F (36.7 C) (Oral)  Resp 16  SpO2 100% Physical Exam  Nursing note and vitals reviewed. Constitutional: He is oriented to person, place, and time. He appears well-developed and well-nourished.  HENT:  Head: Normocephalic and atraumatic.  Eyes: EOM are normal.  Neck: Normal range of motion.  Cardiovascular: Normal rate, regular rhythm, normal heart sounds and intact distal pulses.   Pulmonary/Chest: Effort normal and breath sounds normal. No respiratory distress.  Abdominal: Soft. He exhibits no distension. There is no tenderness.  Musculoskeletal:  Normal range of motion.  Neurological: He is alert and oriented to person, place, and time.  Skin: Skin is warm and dry.  Psychiatric: He has a normal mood and affect. Judgment normal.    ED Course  Procedures (including critical care time) Labs Review Labs Reviewed  CBC WITH DIFFERENTIAL - Abnormal; Notable for the following:    RBC 3.35 (*)    Hemoglobin 8.7 (*)    HCT 26.1 (*)    MCV 77.9 (*)    RDW 20.2 (*)    Neutrophils Relative % 81 (*)    Neutro Abs 7.8 (*)    All other components within normal limits  BASIC METABOLIC PANEL - Abnormal; Notable for the following:    Sodium 135 (*)    Potassium 3.4 (*)    CO2 18 (*)    BUN 45 (*)    Creatinine, Ser 6.74 (*)    GFR calc non Af Amer 7 (*)    GFR calc Af Amer 8 (*)    All other components within normal limits  I-STAT CHEM 8, ED - Abnormal; Notable for the following:    Potassium 3.3 (*)    BUN 42 (*)    Creatinine, Ser 7.00 (*)    Calcium, Ion 1.10 (*)    Hemoglobin 8.2 (*)    HCT 24.0 (*)    All other components within normal limits  CULTURE, BLOOD (ROUTINE X 2)  CULTURE, BLOOD (ROUTINE X 2)  URINE CULTURE  URINALYSIS, ROUTINE W REFLEX MICROSCOPIC  PROTIME-INR    Imaging Review Ct Abdomen Pelvis W Contrast  04/27/2014   CLINICAL DATA:  Left flank pain.  History of colon cancer.  EXAM: CT ABDOMEN AND PELVIS WITH CONTRAST  TECHNIQUE: Multidetector CT imaging of the abdomen and pelvis was performed using the standard protocol following bolus administration of intravenous contrast.  CONTRAST:  157mL OMNIPAQUE IOHEXOL 300 MG/ML  SOLN  COMPARISON:  CT abdomen pelvis 03/21/2014.  FINDINGS: Visualization of the lower thorax demonstrates new small left pleural effusion. Normal heart size.  Unchanged multiple sub cm low-attenuation lesions within the liver. Focal fatty deposition adjacent to the falciform ligament. Hepatic and portal veins are patent. Gallbladder is unremarkable. Spleen, pancreas and bilateral adrenal glands  are unremarkable.  Patient status post low anterior resection. 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 Adjacent to the resection site within the low anatomic pelvis is a complex soft tissue, gas and fluid containing mass located anterior to and adjacent to the sacrum measuring 8.2 x 5.7 cm, slightly increased when compared to prior examination. Interval increase in now moderate amount of ascites.  There is new severe bilateral hydroureteronephrosis with bilateral delayed nephrograms, worse within the right kidney with extensive left-greater-than-right  perinephric fluid and fat stranding. Delayed phase images demonstrate excretion of contrast within the left renal collecting system. No excretion of contrast is demonstrated within the right renal collecting system. This new marked hydroureteronephrosis likely secondary to the worsening above described pelvic soft tissue, fluid and gas collection. The urinary bladder is distended.  Re- demonstrated nonspecific 1.1 cm enhancing mass along the posterior left lateral aspect of the urinary bladder. Central dystrophic calcifications within the prostate. Oral contrast material is demonstrated throughout the small bowel. No definite evidence for bowel obstruction.  No definite aggressive or acute appearing osseous lesions. Mild mottled appearance of the sacrum. Bilateral L5 pars defects.  IMPRESSION: 1. Interval worsening of large irregular soft tissue mass fluid/gas collection within the presacral space and involving parasacral musculature. Findings may be secondary to an inflammatory/infectious mass however given the history of malignancy, local recurrence is a consideration. 2. There is now involvement of the distal aspect of the bilateral ureters by the large soft tissue mass within the presacral region causing severe bilateral hydronephrosis, right-greater-than-left with extensive perinephric fat  stranding as well as delayed nephrograms. No excretion from the right kidney with minimal excretion from the left kidney compatible with high-grade obstruction. 3. 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 4. Interval increase in moderate ascites and anasarca. 5. New small left pleural effusion. 6. Nonspecific enhancing mass along the posterior lateral aspect of the bladder wall potentially secondary to infectious or inflammatory process, ureterocele or metastatic lesion. 7. Mild mottled appearance of the sacrum. Osteomyelitis not excluded. These results were called by telephone at the time of interpretation on 04/27/2014 at 3:33 PM to Dr. Tyler Pita , who verbally acknowledged these results.   Electronically Signed   By: Lovey Newcomer M.D.   On: 04/27/2014 15:53  I personally reviewed the imaging tests through PACS system I reviewed available ER/hospitalization records through the EMR    EKG Interpretation None      MDM   Final diagnoses:  None    Patient with worsening sacral mass.  This will likely need additional imaging as an inpatient such as MRI of his abdomen.  His issue now is he has acute renal failure with a creatinine of nearly 7.  Patient will undergo urgent bilateral nephrostomy tubes tonight for decompression of his hydronephrosis bilaterally.  It should improve his renal function.  Patient be admitted to hospitalist service. Hydration now     Hoy Morn, MD 04/27/14 [phone]

## 2014-04-27 NOTE — Progress Notes (Signed)
Called for report but was told patient is supposed to go to [facility] ED before coming to the floor. Report not ready. Will follow up.

## 2014-04-27 NOTE — Consult Note (Signed)
Interventional Radiology Consult Note  Reason for Consult: Bilateral hydronephrosis and AKI Referring Physician: Jola Schmidt, MD   HPI: Randy Gould is an 73 y.o. male with a prior history of colon cancer (Dx 1996) s/p CTX/RTX followed by resection and colostomy and current unresectable T3 lung adenocarcinoma undergoing SBRT.  He presented to the ED today from radiation oncology with complaint of flank pain and nausea.  CT scan reveals interval enlargement of a presacral mass and new severe bilateral hydronephrosis with left perinephric fluid likely 2/2 calyceal rupture.  Labs show acute renal failure with elevation of Cr from 0.85 two weeks ago to 7 today.  BUN increased from 8 to 42.  K remains WNL.  [name] denies fever, chills, SOB, and chest pain.  He describes a several day hx of progressive LEFT flank pain, nausea and vomiting with associated decreased PO intake.  He last ate yesterday.  He denies allergies and use of blood thinners.   Past Medical History:  Past Medical History  Diagnosis Date  . Emphysema/COPD   . Rectal pain   . Colostomy in place   . Proctitis   . Hypotonic bladder   . Incomplete emptying of bladder   . History of malignant neoplasm of rectum     1996-- (T3 N1)   S/P  RESECTION WITH COLOSTOMY/   CHEMORADATION THERAPY  . Pelvic mass   . History of radiation therapy     RIGHT UPPER LUNG LOBE--   01-30-2014  to  03-07-2014  (45 gray)  . GERD (gastroesophageal reflux disease)   . Wears glasses   . Wears dentures   . Adenocarcinoma of lung     RIGHT UPPER LOBE (stage IIB , T2  N0, M0)--  DX JAN 2015  S/P  RADIATION TX  (COMPLETE  03-07-2014)    Surgical History:  Past Surgical History  Procedure Laterality Date  . Nasal septum surgery  1972  . Exploratory laparotomy /  lysis adhesions  AUG  1997    CHRONIC BOWEL OBSTRUCTION  . Small bowel resection and repair colostomy  JAN  2000  . Colonoscopy w/ polypectomy  03-16-2014  . Low anterior resection  with primary anastomosis/  trnasverse loop colostomy with division  11-10-1995  . Examination under anesthesia N/A 04/09/2014    Procedure: EXAM UNDER ANESTHESIA, FLEX SIGMOIDOSCOPYM POSSIBLE BIOPSY;  Surgeon: Leighton Ruff, MD;  Location: [address];  Service: General;  Laterality: N/A;    Family History:  Family History  Problem Relation Age of Onset  . Stomach cancer Mother   . Colon cancer Neg Hx     Social History:  reports that he quit smoking about 3 months ago. His smoking use included Cigarettes. He has a 30 pack-year smoking history. He has never used smokeless tobacco. He reports that he does not drink alcohol or use illicit drugs.  Allergies:  Allergies  Allergen Reactions  . Ativan [Lorazepam] Other (See Comments)    REACTION: Increase heart rate  . Other Itching and Rash    States "head to toe" itching from a "powerful antibiotic" administered years ago.  Cannot recall name of antibiotic.    Medications: I have reviewed the patient's current medications.  ROS: See HPI for pertinent findings, otherwise complete 10 system review negative.  Physical Exam: Blood pressure 152/66, pulse 73, temperature 98.3 F (36.8 C), temperature source Oral, resp. rate 16, height 5' 9.5" (1.765 m), weight 120 lb 6.4 oz (54.613 kg), SpO2 96.00%. Gen: Cachetic  male in NAD HEENT: Poor dentition, Malampati 1 airway, no icterus Cardiac: RRR, no m/r/g Pulmonary: CTA B Abd: Scaphoid, soft, NT/ND  Labs: CBC  Recent Labs  04/27/14 1710 04/27/14 1720  WBC 9.6  --   HGB 8.7* 8.2*  HCT 26.1* 24.0*  PLT PLATELET CLUMPS NOTED ON SMEAR, COUNT APPEARS INCREASED  --    MET  Recent Labs  04/27/14 1710 04/27/14 1720  NA 135* 138  K 3.4* 3.3*  CL 96 102  CO2 18*  --   GLUCOSE 90 86  BUN 45* 42*  CREATININE 6.74* 7.00*  CALCIUM 8.4  --    No results found for this basename: PROT, ALBUMIN, AST, ALT, ALKPHOS, BILITOT, BILIDIR, IBILI, LIPASE,  in the last 72  hours PT/INR  Recent Labs  04/27/14 1710  LABPROT 14.2  INR 1.12   ABG No results found for this basename: PHART, PCO2, PO2, HCO3,  in the last 72 hours    Ct Abdomen Pelvis W Contrast  04/27/2014   CLINICAL DATA:  Left flank pain.  History of colon cancer.  EXAM: CT ABDOMEN AND PELVIS WITH CONTRAST  TECHNIQUE: Multidetector CT imaging of the abdomen and pelvis was performed using the standard protocol following bolus administration of intravenous contrast.  CONTRAST:  147m OMNIPAQUE IOHEXOL 300 MG/ML  SOLN  COMPARISON:  CT abdomen pelvis 03/21/2014.  FINDINGS: Visualization of the lower thorax demonstrates new small left pleural effusion. Normal heart size.  Unchanged multiple sub cm low-attenuation lesions within the liver. Focal fatty deposition adjacent to the falciform ligament. Hepatic and portal veins are patent. Gallbladder is unremarkable. Spleen, pancreas and bilateral adrenal glands are unremarkable.  Patient status post low anterior resection. 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 Adjacent to the resection site within the low anatomic pelvis is a complex soft tissue, gas and fluid containing mass located anterior to and adjacent to the sacrum measuring 8.2 x 5.7 cm, slightly increased when compared to prior examination. Interval increase in now moderate amount of ascites.  There is new severe bilateral hydroureteronephrosis with bilateral delayed nephrograms, worse within the right kidney with extensive left-greater-than-right perinephric fluid and fat stranding. Delayed phase images demonstrate excretion of contrast within the left renal collecting system. No excretion of contrast is demonstrated within the right renal collecting system. This new marked hydroureteronephrosis likely secondary to the worsening above described pelvic soft tissue, fluid and gas collection. The urinary bladder is distended.  Re- demonstrated  nonspecific 1.1 cm enhancing mass along the posterior left lateral aspect of the urinary bladder. Central dystrophic calcifications within the prostate. Oral contrast material is demonstrated throughout the small bowel. No definite evidence for bowel obstruction.  No definite aggressive or acute appearing osseous lesions. Mild mottled appearance of the sacrum. Bilateral L5 pars defects.  IMPRESSION: 1. Interval worsening of large irregular soft tissue mass fluid/gas collection within the presacral space and involving parasacral musculature. Findings may be secondary to an inflammatory/infectious mass however given the history of malignancy, local recurrence is a consideration. 2. There is now involvement of the distal aspect of the bilateral ureters by the large soft tissue mass within the presacral region causing severe bilateral hydronephrosis, right-greater-than-left with extensive perinephric fat stranding as well as delayed nephrograms. No excretion from the right kidney with minimal excretion from the left kidney compatible with high-grade obstruction. 3. 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 4. Interval increase in moderate ascites and anasarca. 5. New small left  pleural effusion. 6. Nonspecific enhancing mass along the posterior lateral aspect of the bladder wall potentially secondary to infectious or inflammatory process, ureterocele or metastatic lesion. 7. Mild mottled appearance of the sacrum. Osteomyelitis not excluded. These results were called by telephone at the time of interpretation on 04/27/2014 at 3:33 PM to Dr. Tyler Pita , who verbally acknowledged these results.   Electronically Signed   By: Lovey Newcomer M.D.   On: 04/27/2014 15:53    Assessment/Plan: 73 yo male with acute malignant obstructed uropathy and acute kidney injury.  - Emergent bilateral percutaneous nephrostomies to decompress kidneys in an effort to preserve renal  function  Signed,  Criselda Peaches, MD [facility]

## 2014-04-27 NOTE — H&P (Signed)
[facility] History and Physical  Randy Gould ZYS:[id number] DOB: [birth year] DOA: 04/27/2014  Referring physician: ED physician PCP: [name], MD   Chief Complaint: left flank pain   HPI:  Pt is 73 yo male currently treated for lung cancer who presented to [facility] ED earlier today after being called by his radiation oncologist as CT abd and pelvis demonstrated bilateral hydronephrosis and new worsening sacral mass. Pt explains he has noted progressively worsening left flank pain over the past week, constant and sharp, 7/10 in severity and radiating to the bilateral groin area, no specific alleviating or aggravating factors. This has been associated with poor oral intake, nausea and several episodes of non bloody vomiting. He denies chest pain or shortness of breath, no specific urinary concerns other that dark urine.  In ED, pt is hemodynamically stable, CT abd with high-grade bilateral hydroureteronephrosis, being produced by the complex mass developing in his pelvis. 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 Recurrent rectal cancer unlikely 20 years after his treatment, but in the differential diagnosis. Cr noted to be 7 and [facility] asked to admit for further evaluation. Interventional radiology consulted and recommendation was to transfer pt directly to [facility] for emergent bilateral percutaneous nephrostomies to decompress kidneys in an effort to preserve renal function  Assessment and Plan: Active Problems: Bilateral hydronephrosis  - produced by pelvic mass  - proceed with emergent bilateral perc nephrostomies at [facility] tonight  - appreciate IR help - pt may need to be transferred back to [facility] in AM for further evaluation and management and will defer to primary team in AM Acute renal failure - appears to be secondary to bilateral hydronephrosis with underlying dehydration in the setting of poor oral intake - IVF  provided and will repeat BMP in AM  Mass in presacral area  - 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 [name] radiation oncologist  - tentative plan is to proceed with 2 weeks of completion of radiation for lung cancer per radiation oncology - possible hyperbaric oxygen treatment to help with pelvic wound healing status post previous pelvic radiotherapy 20 years ago - may need to be transferred back to [facility] for continuation of management  Protein-calorie malnutrition, severe  - NPO for now but will advance as pt able to tolerate FTT (failure to thrive) in adult  - nutrition consultation  Anasarca - from low protein and progressive malignancy  - IVF will be provided, gentle hydration only - may be able to d/c in next 10 -12 hours   Radiological Exams on Admission: Ct Abdomen Pelvis W Contrast  04/27/2014   1. Interval worsening of large irregular soft tissue mass fluid/gas collection within the presacral space and involving parasacral musculature. Findings may be secondary to an inflammatory/infectious mass however given the history of malignancy, local recurrence is a consideration.  2. There is now involvement of the distal aspect of the bilateral ureters by the large soft tissue  mass within the presacral region causing severe bilateral hydronephrosis, right-greater-than-left with extensive perinephric fat stranding as well as delayed nephrograms. No excretion from the right kidney with minimal excretion from the  left kidney compatible with high-grade obstruction.  3. 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  4. Interval increase in moderate ascites and anasarca.  5. New small left pleural effusion.  6. Nonspecific enhancing mass along the posterior lateral aspect of the bladder wall potentially secondary to infectious or inflammatory process, ureterocele or metastatic lesion.  7. Mild mottled appearance of the sacrum. Osteomyelitis not excluded.   Code Status: Full Family Communication: Pt at bedside Disposition Plan: Admit for further evaluation, admission to [facility] requested    Review of Systems:  Constitutional: Negative for diaphoresis.  HENT: Negative for hearing loss, ear pain, nosebleeds, congestion, sore throat, neck pain, tinnitus and ear discharge.   Eyes: Negative for blurred vision, double vision, photophobia, pain, discharge and redness.  Respiratory: Negative for cough, hemoptysis, sputum production, shortness of breath, wheezing and stridor.   Cardiovascular: Negative for chest pain, palpitations, orthopnea, claudication and leg swelling.  Gastrointestinal: Negative for heartburn, constipation, blood in stool and melena.  Genitourinary: Per HPI  Musculoskeletal: Negative for myalgias, back pain, joint pain and falls.  Skin: Negative for itching and rash.  Neurological: Negative for dizziness, speech change, focal weakness, loss of consciousness and headaches.  Endo/Heme/Allergies: Negative for environmental allergies and polydipsia. Does not bruise/bleed easily.  Psychiatric/Behavioral: Negative for suicidal ideas. The patient is not nervous/anxious.      Past Medical History  Diagnosis Date  . Emphysema/COPD   . Rectal pain   . Colostomy in place   . Proctitis   . Hypotonic bladder   . Incomplete emptying of bladder   . History of malignant neoplasm of rectum     1996-- (T3 N1)   S/P  RESECTION WITH COLOSTOMY/   CHEMORADATION THERAPY  . Pelvic  mass   . History of radiation therapy     RIGHT UPPER LUNG LOBE--   01-30-2014  to  03-07-2014  (45 gray)  . GERD (gastroesophageal reflux disease)   . Wears glasses   . Wears dentures   . Adenocarcinoma of lung     RIGHT UPPER LOBE (stage IIB , T2  N0, M0)--  DX JAN 2015  S/P  RADIATION TX  (COMPLETE  03-07-2014)    Past Surgical History  Procedure Laterality Date  . Nasal septum surgery  1972  . Exploratory laparotomy /  lysis adhesions  AUG  1997    CHRONIC BOWEL OBSTRUCTION  . Small bowel resection and repair colostomy  JAN  2000  . Colonoscopy w/ polypectomy  03-16-2014  . Low anterior resection with primary anastomosis/  trnasverse loop colostomy with division  11-10-1995  . Examination under anesthesia N/A 04/09/2014    Procedure: EXAM UNDER ANESTHESIA, FLEX SIGMOIDOSCOPYM POSSIBLE BIOPSY;  Surgeon: Leighton Ruff, MD;  Location: [place];  Service: General;  Laterality: N/A;    Social History:  reports that he quit smoking about 3 months ago. His smoking use included Cigarettes. He has a 30 pack-year smoking history. He has never used smokeless tobacco. He reports that he does not drink alcohol or use illicit drugs.  Allergies  Allergen Reactions  . Ativan [Lorazepam] Other (See Comments)    REACTION: Increase heart rate  . Other Itching and Rash    States "head to toe" itching from a "powerful antibiotic" administered years ago.  Cannot  recall name of antibiotic.    Family History  Problem Relation Age of Onset  . Stomach cancer Mother   . Colon cancer Neg Hx     Prior to Admission medications   Medication Sig Start Date End Date Taking? Authorizing Provider  acetaminophen (TYLENOL) 650 MG CR tablet Take 650 mg by mouth every 4 (four) hours as needed for pain.    Yes Historical Provider, MD  esomeprazole (NEXIUM) 20 MG capsule Take 20 mg by mouth daily as needed (for acid reflex).    Yes Historical Provider, MD  oxyCODONE (OXY IR/ROXICODONE) 5 MG  immediate release tablet Take 1-2 tablets (5-10 mg total) by mouth every 4 (four) hours as needed for severe pain. 04/20/14  Yes Ivin Poot, MD  prochlorperazine (COMPAZINE) 10 MG tablet Take 10 mg by mouth every 6 (six) hours as needed for nausea or vomiting.   Yes Historical Provider, MD  sucralfate (CARAFATE) 1 GM/10ML suspension Place 10 mLs (1 g total) rectally 3 (three) times daily. 01/11/94  Yes Leighton Ruff, MD    Physical Exam: Filed Vitals:   04/27/14 1636 04/27/14 1805  BP: 146/67 150/70  Pulse: 86 74  Temp: 98.1 F (36.7 C)   TempSrc: Oral   Resp: 17 16  SpO2: 99% 100%    Physical Exam  Constitutional: Appears cachectic and malnourished, NAD HENT: Normocephalic. External right and left ear normal. Dry MM  Eyes: Conjunctivae and EOM are normal. PERRLA, no scleral icterus.  Neck: Normal ROM. Neck supple. No JVD. No tracheal deviation. No thyromegaly.  CVS: RRR, S1/S2 +, no murmurs, no gallops, no carotid bruit.  Pulmonary: Effort and breath sounds normal, no stridor, poor air movement at bases with bibasilar crackles  Abdominal: Soft. BS +, slight distension, notable tenderness in suprapubic area but no rebound or guarding.  Musculoskeletal: Normal range of motion. Bilateral LE edema   Lymphadenopathy: No lymphadenopathy noted, cervical, inguinal. Neuro: Alert. Normal reflexes, muscle tone coordination. No cranial nerve deficit. Skin: Skin is warm and dry. No rash noted. Not diaphoretic. No erythema. No pallor.  Psychiatric: Normal mood and affect. Behavior, judgment, thought content normal.   Labs on Admission:  Basic Metabolic Panel:  Recent Labs Lab 04/27/14 1710 04/27/14 1720  NA 135* 138  K 3.4* 3.3*  CL 96 102  CO2 18*  --   GLUCOSE 90 86  BUN 45* 42*  CREATININE 6.74* 7.00*  CALCIUM 8.4  --    CBC:  Recent Labs Lab 04/27/14 1710 04/27/14 1720  WBC 9.6  --   NEUTROABS 7.8*  --   HGB 8.7* 8.2*  HCT 26.1* 24.0*  MCV 77.9*  --   PLT PLATELET  CLUMPS NOTED ON SMEAR, COUNT APPEARS INCREASED  --    EKG: Pending   Theodis Blaze, MD  [facility] Pager [phone]  If 7PM-7AM, please contact night-coverage [email or web address] Password [facility] 04/27/2014, 7:37 PM

## 2014-04-28 ENCOUNTER — Inpatient Hospital Stay (HOSPITAL_COMMUNITY): Payer: Medicare Other

## 2014-04-28 DIAGNOSIS — E876 Hypokalemia: Secondary | ICD-10-CM

## 2014-04-28 DIAGNOSIS — N179 Acute kidney failure, unspecified: Secondary | ICD-10-CM | POA: Diagnosis present

## 2014-04-28 DIAGNOSIS — N1330 Unspecified hydronephrosis: Principal | ICD-10-CM

## 2014-04-28 DIAGNOSIS — R1900 Intra-abdominal and pelvic swelling, mass and lump, unspecified site: Secondary | ICD-10-CM

## 2014-04-28 DIAGNOSIS — D649 Anemia, unspecified: Secondary | ICD-10-CM | POA: Diagnosis present

## 2014-04-28 LAB — BASIC METABOLIC PANEL
BUN: 35 mg/dL — AB (ref 6–23)
BUN: 20 mg/dL (ref 6–23)
CALCIUM: 7.8 mg/dL — AB (ref 8.4–10.5)
CO2: 19 mEq/L (ref 19–32)
CO2: 21 mEq/L (ref 19–32)
Calcium: 7.6 mg/dL — ABNORMAL LOW (ref 8.4–10.5)
Chloride: 108 mEq/L (ref 96–112)
Chloride: 105 mEq/L (ref 96–112)
Creatinine, Ser: 4.57 mg/dL — ABNORMAL HIGH (ref 0.50–1.35)
Creatinine, Ser: 1.81 mg/dL — ABNORMAL HIGH (ref 0.50–1.35)
GFR calc non Af Amer: 36 mL/min — ABNORMAL LOW (ref >90)
GFR, EST AFRICAN AMERICAN: 13 mL/min — AB (ref >90)
GFR, EST AFRICAN AMERICAN: 41 mL/min — AB (ref >90)
GFR, EST NON AFRICAN AMERICAN: 12 mL/min — AB (ref >90)
GLUCOSE: 155 mg/dL — AB (ref 70–99)
Glucose, Bld: 88 mg/dL (ref 70–99)
POTASSIUM: 3.4 meq/L — AB (ref 3.7–5.3)
Potassium: 3 mEq/L — ABNORMAL LOW (ref 3.7–5.3)
Sodium: 141 mEq/L (ref 137–147)
Sodium: 139 mEq/L (ref 137–147)

## 2014-04-28 LAB — URINE CULTURE: Colony Count: 3000

## 2014-04-28 LAB — CBC
HCT: 21 % — ABNORMAL LOW (ref 39.0–52.0)
HCT: 21.5 % — ABNORMAL LOW (ref 39.0–52.0)
HEMOGLOBIN: 7.1 g/dL — AB (ref 13.0–17.0)
HEMOGLOBIN: 7.2 g/dL — AB (ref 13.0–17.0)
MCH: 26.4 pg (ref 26.0–34.0)
MCH: 26.7 pg (ref 26.0–34.0)
MCHC: 33.8 g/dL (ref 30.0–36.0)
MCHC: 33.5 g/dL (ref 30.0–36.0)
MCV: 78.1 fL (ref 78.0–100.0)
MCV: 79.6 fL (ref 78.0–100.0)
PLATELETS: 253 10*3/uL (ref 150–400)
Platelets: 365 10*3/uL (ref 150–400)
RBC: 2.69 MIL/uL — ABNORMAL LOW (ref 4.22–5.81)
RBC: 2.7 MIL/uL — ABNORMAL LOW (ref 4.22–5.81)
RDW: 20.4 % — AB (ref 11.5–15.5)
RDW: 20.4 % — ABNORMAL HIGH (ref 11.5–15.5)
WBC: 9.8 10*3/uL (ref 4.0–10.5)
WBC: 10 10*3/uL (ref 4.0–10.5)

## 2014-04-28 LAB — TYPE AND SCREEN
ABO/RH(D): O POS
Antibody Screen: NEGATIVE

## 2014-04-28 LAB — MAGNESIUM: MAGNESIUM: 1.2 mg/dL — AB (ref 1.5–2.5)

## 2014-04-28 LAB — ABO/RH: ABO/RH(D): O POS

## 2014-04-28 MED ORDER — POTASSIUM CHLORIDE CRYS ER 20 MEQ PO TBCR
30.0000 meq | EXTENDED_RELEASE_TABLET | ORAL | Status: AC
  Administered 2014-04-28 (×2): 30 meq via ORAL
  Filled 2014-04-28 (×2): qty 1

## 2014-04-28 MED ORDER — BOOST / RESOURCE BREEZE PO LIQD
1.0000 | Freq: Three times a day (TID) | ORAL | Status: DC
  Administered 2014-04-28 – 2014-04-30 (×6): 1 via ORAL

## 2014-04-28 MED ORDER — HYDROCODONE-ACETAMINOPHEN 5-325 MG PO TABS
1.0000 | ORAL_TABLET | ORAL | Status: DC | PRN
  Administered 2014-04-28: 2 via ORAL
  Administered 2014-04-28: 1 via ORAL
  Administered 2014-04-29 – 2014-04-30 (×4): 2 via ORAL
  Filled 2014-04-28 (×4): qty 2
  Filled 2014-04-28: qty 1
  Filled 2014-04-28: qty 2

## 2014-04-28 NOTE — Progress Notes (Signed)
New Admission Note:  Arrival Method: Via stretcher with [facility] nurse Mental Orientation: Alert and orientedx4 Telemetry:placed on box 19, notified CCMD Assessment: Completed Safety Measures: Safety Fall Prevention Plan was given, discussed and signed. Admission: Completed [address] Orientation: Patient has been orientated to the room, unit and the staff. Family:none at bedside  Orders have been reviewed and implemented. Will continue to monitor the patient. Call light has been placed within reach and bed alarm has been activated.   Leandro Reasoner BSN, RN  Phone Number: [phone] [facility]

## 2014-04-28 NOTE — Progress Notes (Addendum)
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 [name], radiation oncology on 5/22 for simulation and treatment at which time patient complained of left flank pain 10/10. 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 Patient was advised to come to [facility] long hospital ED. Creatinine was in the 7 range. Interventional radiology was consulted and recommended transfer to [facility]. Patient underwent emergent bilateral percutaneous nephrostomies on 5/22. Dr. [name] recommends palliative care and ID consultation.     Assessment/Plan:  1. Acute renal failure secondary to bilateral hydronephrosis from enlarging malignant pelvic mass: Patient underwent emergent bilateral percutaneous nephrostomy tube placement by IR. Creatinine improving. Monitor BMP closely. 2. Obstructive uropathy/bilateral hydronephrosis: Management as above. 3. Pelvic mass/? Local recurrence of colorectal cancer: MRI shows a large necrotic mass or complex fluid collection in the presacral space, extending through the sciatic notch-since this has been present for at least 4 months, necrotic neoplasm is favored (potentially superinfected). Suspected direct extension of tumor or osteomyelitis within the adjacent right sacrum. Discuss with oncology regarding further management. Will get palliative care consultation and depending on how aggressive the patient/family wishes to be, may  consider biopsy of pelvic mass by interventional radiology. Patient does not look septic or toxic and does not have fever or leukocytosis. Will hold Abx unless develops features suggestive of infection. Recently had rectal pain and underwent flexible sigmoidoscopy on 04/09/14 which showed fistula leading radiation proctitis-was on antibiotics. Will discuss with surgeons. 4. Anemia: Secondary to chronic disease. Follow CBCs closely and transfuse if hemoglobin less than 7 g per DL. 5. Hypokalemia: Replace and follow BMP. 6. T3, N0, M0 adenocarcinoma of left upper lung: Outpatient followup with radiation oncology. 7. Severe malnutrition in the context of chronic illness: Management per dietitian consultation. 8. History of COPD: Stable 9. Status post colostomy:   Code Status: Full Family Communication: Discussed with spouse, updated care and answered questions. Disposition Plan: Home when medically stable.   Consultants:  Interventional radiology  Palliative Care team  Discussed with Dr. Thea Silversmith, radiation oncology-agrees with plan of care for pelvic mass as outlined above.  Procedures:  Bilateral percutaneous nephrostomies   Antibiotics:  None   Subjective: Patient states that he feels much better with significant improvement in flank pain. Denies nausea, vomiting or fevers.  Objective: Filed Vitals:   04/27/14 2300 04/28/14 0238 04/28/14 0549 04/28/14 1445  BP: 112/52 127/62 147/83 137/50  Pulse: 65 60 80 89  Temp:  98.6 F (37 C) 99 F (37.2 C) 99.2 F (37.3 C)  TempSrc:  Oral Oral Oral  Resp: 14 14 15 15   Height:      Weight:      SpO2: 100% 93% 93% 96%    Intake/Output Summary (Last 24 hours) at 04/28/14 1528 Last data filed at 04/28/14 1200  Gross per 24 hour  Intake 1174.25 ml  Output   3700 ml  Net -2525.75 ml   Filed Weights   04/27/14 2112  Weight: 54.613 kg (120 lb 6.4 oz)     Exam:  General exam: Elderly frail  male lying comfortably in  bed. Respiratory system: Clear. No increased work of breathing. Cardiovascular system: S1 & S2 heard, RRR. No JVD, murmurs, gallops, clicks or pedal edema. Gastrointestinal system: Abdomen is nondistended, soft and nontender. Normal bowel sounds heard. Colostomy in place with soft green stools. Bilateral percutaneous nephrostomy draining straw-colored urine. Central nervous system: Alert and oriented. No focal neurological deficits. Extremities: Symmetric 5 x 5 power.   Data Reviewed: Basic Metabolic Panel:  Recent Labs Lab 04/27/14 1710 04/27/14 1720 04/28/14 0459  NA 135* 138 141  K 3.4* 3.3* 3.0*  CL 96 102 108  CO2 18*  --  19  GLUCOSE 90 86 88  BUN 45* 42* 35*  CREATININE 6.74* 7.00* 4.57*  CALCIUM 8.4  --  7.8*   Liver Function Tests: No results found for this basename: AST, ALT, ALKPHOS, BILITOT, PROT, ALBUMIN,  in the last 168 hours No results found for this basename: LIPASE, AMYLASE,  in the last 168 hours No results found for this basename: AMMONIA,  in the last 168 hours CBC:  Recent Labs Lab 04/27/14 1710 04/27/14 1720 04/28/14 0459  WBC 9.6  --  9.8  NEUTROABS 7.8*  --   --   HGB 8.7* 8.2* 7.1*  HCT 26.1* 24.0* 21.0*  MCV 77.9*  --  78.1  PLT PLATELET CLUMPS NOTED ON SMEAR, COUNT APPEARS INCREASED  --  253   Cardiac Enzymes: No results found for this basename: CKTOTAL, CKMB, CKMBINDEX, TROPONINI,  in the last 168 hours BNP (last 3 results) No results found for this basename: PROBNP,  in the last 8760 hours CBG: No results found for this basename: GLUCAP,  in the last 168 hours  Recent Results (from the past 240 hour(s))  CULTURE, BLOOD (ROUTINE X 2)     Status: None   Collection Time    04/27/14  5:25 PM      Result Value Ref Range Status   Specimen Description BLOOD RIGHT FOREARM   Final   Special Requests BOTTLES DRAWN AEROBIC AND ANAEROBIC 5ML   Final   Culture  Setup Time     Final   Value: 04/27/2014 22:42     Performed at [facility]   Culture     Final   Value:        BLOOD CULTURE RECEIVED NO GROWTH TO DATE CULTURE WILL BE HELD FOR 5 DAYS BEFORE ISSUING A FINAL NEGATIVE REPORT     Performed at [facility]   Report Status PENDING   Incomplete         Studies: Mr Pelvis Wo Contrast  04/28/2014   CLINICAL DATA:  Enlarging pelvic mass causing hydronephrosis on recent CT. Remote history of colon cancer. Currently being treated for lung cancer. 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 Recurrent rectal cancer unlikely 20 years after his treatment, but in the differential diagnosis.  EXAM: MRI PELVIS WITHOUT CONTRAST  TECHNIQUE: Multiplanar multisequence MR imaging of the pelvis was performed. No intravenous contrast was administered.  COMPARISON:  Abdominal pelvic CT 04/27/2014.  PET-CT 12/21/2013.  FINDINGS: Examination is limited by motion and lack of contrast.  As seen on recent CT, there is a large complex mass or fluid collection in the presacral space, extending off midline to the right through the sciatic foramen. This measures approximately 5.3 x 8.7 cm transverse and 6.7 cm cephalocaudad. This process of buttocks and erodes the anterior cortex of the  sacrum. There is diffuse replacement of the normal marrow signal throughout the right aspect of the sacrum. The sacroiliac joints appear normal. The remainder of the bony pelvis appears normal.  As correlated with the CT, this presacral process is intimately associated with and indistinguishable from the rectum. Patient is status post descending colostomy. Ureterectasis appears improved following recent bilateral percutaneous nephrostomy. The urinary bladder is mildly distended. There is generalized soft tissue edema throughout the pelvis and lower abdomen. Small bilateral hip joint effusions are noted.  IMPRESSION: 1. Again demonstrated is a large necrotic mass or complex fluid  collection in the presacral space, extending through the sciatic notch. Since this has been present for at least 4 months, necrotic neoplasm is favored (potentially superinfected). 2. Adjacent cortical destruction and abnormal signal within the right aspect of the sacrum suspicious for either direct extension of tumor or osteomyelitis. 3. Improved bilateral ureterectasis status post bilateral ureteral stent placement. 4. The presacral process is amenable to percutaneous transgluteal drainage/biopsy.   Electronically Signed   By: Camie Patience M.D.   On: 04/28/2014 11:10   Ct Abdomen Pelvis W Contrast  04/27/2014   CLINICAL DATA:  Left flank pain.  History of colon cancer.  EXAM: CT ABDOMEN AND PELVIS WITH CONTRAST  TECHNIQUE: Multidetector CT imaging of the abdomen and pelvis was performed using the standard protocol following bolus administration of intravenous contrast.  CONTRAST:  170mL OMNIPAQUE IOHEXOL 300 MG/ML  SOLN  COMPARISON:  CT abdomen pelvis 03/21/2014.  FINDINGS: Visualization of the lower thorax demonstrates new small left pleural effusion. Normal heart size.  Unchanged multiple sub cm low-attenuation lesions within the liver. Focal fatty deposition adjacent to the falciform ligament. Hepatic and portal veins are patent. Gallbladder is unremarkable. Spleen, pancreas and bilateral adrenal glands are unremarkable.  Patient status post low anterior resection. 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 Adjacent to the resection site within the low anatomic pelvis is a complex soft tissue, gas and fluid containing mass located anterior to and adjacent to the sacrum measuring 8.2 x 5.7 cm, slightly increased when compared to prior examination. Interval increase in now moderate amount of ascites.  There is new severe bilateral hydroureteronephrosis with bilateral delayed nephrograms, worse within the right kidney with extensive  left-greater-than-right perinephric fluid and fat stranding. Delayed phase images demonstrate excretion of contrast within the left renal collecting system. No excretion of contrast is demonstrated within the right renal collecting system. This new marked hydroureteronephrosis likely secondary to the worsening above described pelvic soft tissue, fluid and gas collection. The urinary bladder is distended.  Re- demonstrated nonspecific 1.1 cm enhancing mass along the posterior left lateral aspect of the urinary bladder. Central dystrophic calcifications within the prostate. Oral contrast material is demonstrated throughout the small bowel. No definite evidence for bowel obstruction.  No definite aggressive or acute appearing osseous lesions. Mild mottled appearance of the sacrum. Bilateral L5 pars defects.  IMPRESSION: 1. Interval worsening of large irregular soft tissue mass fluid/gas collection within the presacral space and involving parasacral musculature. Findings may be secondary to an inflammatory/infectious mass however given the history of malignancy, local recurrence is a consideration. 2. There is now involvement of the distal aspect of the bilateral ureters by the large soft tissue mass within the presacral region causing severe bilateral hydronephrosis, right-greater-than-left with extensive perinephric fat stranding as well as delayed nephrograms. No excretion from the right kidney with minimal excretion from the left kidney compatible with high-grade obstruction.  3. 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 4. Interval increase in moderate ascites and anasarca. 5. New small left pleural effusion. 6. Nonspecific enhancing mass along the posterior lateral aspect of the bladder wall potentially secondary to infectious or inflammatory process, ureterocele or metastatic lesion. 7. Mild mottled appearance of the sacrum. Osteomyelitis not excluded. These results were called by  telephone at the time of interpretation on 04/27/2014 at 3:33 PM to Dr. Tyler Pita , who verbally acknowledged these results.   Electronically Signed   By: Lovey Newcomer M.D.   On: 04/27/2014 15:53   Ir Nephrostogram Left  04/28/2014   CLINICAL DATA:  73 year old male with a past history of colonic adenocarcinoma treated with neoadjuvant chemoradiation followed by resection and colostomy. 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 He was transferred to the emergency department today from Radiation Oncology for progressive left flank pain, decreased urine output, nausea and vomiting. 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 There is evidence of forniceal rupture on the left with marked perinephric fluid. His baseline creatinine is 0.8 has markedly increased 2 7. He is in acute renal failure and requires emergent bilateral percutaneous nephrostomy tube placement for renal decompression and salvage of renal function.  EXAM: PERC NEPHROSTOMY*L*; RIGHT NEPHROSTOGRAM; PERC NEPHROSTOMY*R*; IR ULTRASOUND GUIDANCE; LEFT NEPHROSTOGRAM  Date: 04/28/2014  PROCEDURE: 1. Ultrasound-guided puncture of the left renal collecting system 2. Left antegrade percutaneous nephrostogram 3. Placement of a 10 French percutaneous nephrostomy tube under fluoroscopic guidance 4. Ultrasound guided puncture of the right renal collecting system 5. Right antegrade percutaneous nephrostogram 6. Placement of a 10 French percutaneous nephrostomy tube under fluoroscopic guidance Interventional Radiologist:  Criselda Peaches, MD  ANESTHESIA/SEDATION: Moderate (conscious) sedation was used. 2.5 mg Versed, 100 mcg Fentanyl were administered intravenously. The patient's vital signs were monitored continuously by radiology nursing throughout the procedure.  Sedation Time: 30 minutes  2g Ancef were administered intravenously  within 1 hr of skin incision.  FLUOROSCOPY TIME:  4 minutes 12 seconds  CONTRAST:  62mL OMNIPAQUE IOHEXOL 300 MG/ML  SOLN  TECHNIQUE: Informed consent was obtained from the patient following explanation of the procedure, risks, benefits and alternatives. The patient understands, agrees and consents for the procedure. All questions were addressed. A time out was performed.  Maximal barrier sterile technique utilized including caps, mask, sterile gowns, sterile gloves, large sterile drape, hand hygiene, and Betadine skin prep.  Attention was first turned to the left flank. The left flank was interrogated with ultrasound. The kidney is moderately hydronephrotic. There is marked perinephric fluid and stranding consistent with the findings on the recent CT scan. A suitable access site on the skin overlying the lower pole, posterior calix was identified. After local anesthesia was achieved, a small skin nick was made with an 11 blade scalpel. A 21 gauge Accustick needle was then advanced under direct sonographic guidance into the lower pole of the left kidney. A 0.018 inch wire was advanced under fluoroscopic guidance into the left renal collecting system. The Accustick sheath was then advanced over the wire and a 0.018 system exchanged for a 0.035 system. Gentle hand injection of contrast material confirms placement of the sheath within the renal collecting system. An antegrade nephrostogram was performed confirming moderate hydroureteronephrosis. There is complete obstruction of the distal ureter at the level of the mid sacral ala. The tract from the scan into the renal collecting system was then  dilated serially to 10-French. A 10-French Cook all-purpose drain was then placed and positioned under fluoroscopic guidance. The locking loop is well formed within the left renal pelvis. The catheter was secured to the skin with 2-0 Prolene and a sterile bandage was placed. Catheter was left to gravity bag drainage.  Attention  was next turned to the right flank. The right flank was interrogated with ultrasound. The right kidney is markedly hydronephrotic. No significant perinephric fluid. A suitable access site on the skin overlying the lower pole, posterior calix was identified. After local anesthesia was achieved, a small skin nick was made with an 11 blade scalpel. A 21 gauge Accustick needle was then advanced under direct sonographic guidance into the lower pole of the right kidney. A 0.018 inch wire was advanced under fluoroscopic guidance into the left renal collecting system. The Accustick sheath was then advanced over the wire and a 0.018 system exchanged for a 0.035 system. Gentle hand injection of contrast material confirms placement of the sheath within the renal collecting system. An antegrade nephrostogram was performed confirming severe hydroureteronephrosis. There is complete obstruction of the distal ureter at the level of the mid sacral ala. The tract from the scan into the renal collecting system was then dilated serially to 10-French. A 10-French Cook all-purpose drain was then placed and positioned under fluoroscopic guidance. The locking loop is well formed within the left renal pelvis. The catheter was secured to the skin with 2-0 Prolene and a sterile bandage was placed. Catheter was left to gravity bag drainage.  COMPLICATIONS: None immediate.  IMPRESSION: 1. Moderate left hydronephrosis with evidence of forniceal rupture resulting in moderate perinephric fluid. 2. Severe right hydroureteronephrosis. 3. Successful placement of bilateral 10 French percutaneous nephrostomy tubes. 4. Bilateral percutaneous nephrostograms demonstrate complete obstruction of both ureters at the level of the mid sacrum consistent with malignant obstructive uropathy.  PLAN: Maintained tubes to gravity drainage and follow creatinine. Hematuria is expected to clear over 24-48 hr. Given presumed local recurrence of colon carcinoma in the  setting of active lung adenocarcinoma, these tubes will likely be present for the remainder of the patient's life. Once they have healed (4-6 weeks), an effort can be made to cross the obstructions for conversion to percutaneous nephroureteral stents, or double-J ureteral stents if internalization is desired.  Signed,  Criselda Peaches, MD  [facility]  [facility]   Electronically Signed   By: Jacqulynn Cadet M.D.   On: 04/28/2014 08:01   Ir Nephrostogram Right  04/28/2014   CLINICAL DATA:  73 year old male with a past history of colonic adenocarcinoma treated with neoadjuvant chemoradiation followed by resection and colostomy. 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 He was transferred to the emergency department today from Radiation Oncology for progressive left flank pain, decreased urine output, nausea and vomiting. 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 There is evidence of forniceal rupture on the left with marked perinephric fluid. His baseline creatinine is 0.8 has markedly increased 2 7. He is in acute renal failure and requires emergent bilateral percutaneous nephrostomy tube placement for renal decompression and salvage of renal function.  EXAM: PERC NEPHROSTOMY*L*; RIGHT NEPHROSTOGRAM; PERC NEPHROSTOMY*R*; IR ULTRASOUND GUIDANCE; LEFT NEPHROSTOGRAM  Date: 04/28/2014  PROCEDURE: 1. Ultrasound-guided puncture of the left renal collecting system 2. Left antegrade percutaneous nephrostogram 3. Placement of a 10 French percutaneous nephrostomy tube under fluoroscopic guidance 4. Ultrasound guided  puncture of the right renal collecting system 5. Right antegrade percutaneous nephrostogram 6. Placement of a 10 French percutaneous nephrostomy tube under fluoroscopic guidance Interventional Radiologist:  Criselda Peaches, MD   ANESTHESIA/SEDATION: Moderate (conscious) sedation was used. 2.5 mg Versed, 100 mcg Fentanyl were administered intravenously. The patient's vital signs were monitored continuously by radiology nursing throughout the procedure.  Sedation Time: 30 minutes  2g Ancef were administered intravenously within 1 hr of skin incision.  FLUOROSCOPY TIME:  4 minutes 12 seconds  CONTRAST:  40mL OMNIPAQUE IOHEXOL 300 MG/ML  SOLN  TECHNIQUE: Informed consent was obtained from the patient following explanation of the procedure, risks, benefits and alternatives. The patient understands, agrees and consents for the procedure. All questions were addressed. A time out was performed.  Maximal barrier sterile technique utilized including caps, mask, sterile gowns, sterile gloves, large sterile drape, hand hygiene, and Betadine skin prep.  Attention was first turned to the left flank. The left flank was interrogated with ultrasound. The kidney is moderately hydronephrotic. There is marked perinephric fluid and stranding consistent with the findings on the recent CT scan. A suitable access site on the skin overlying the lower pole, posterior calix was identified. After local anesthesia was achieved, a small skin nick was made with an 11 blade scalpel. A 21 gauge Accustick needle was then advanced under direct sonographic guidance into the lower pole of the left kidney. A 0.018 inch wire was advanced under fluoroscopic guidance into the left renal collecting system. The Accustick sheath was then advanced over the wire and a 0.018 system exchanged for a 0.035 system. Gentle hand injection of contrast material confirms placement of the sheath within the renal collecting system. An antegrade nephrostogram was performed confirming moderate hydroureteronephrosis. There is complete obstruction of the distal ureter at the level of the mid sacral ala. The tract from the scan into the renal collecting system was then dilated serially to 10-French. A  10-French Cook all-purpose drain was then placed and positioned under fluoroscopic guidance. The locking loop is well formed within the left renal pelvis. The catheter was secured to the skin with 2-0 Prolene and a sterile bandage was placed. Catheter was left to gravity bag drainage.  Attention was next turned to the right flank. The right flank was interrogated with ultrasound. The right kidney is markedly hydronephrotic. No significant perinephric fluid. A suitable access site on the skin overlying the lower pole, posterior calix was identified. After local anesthesia was achieved, a small skin nick was made with an 11 blade scalpel. A 21 gauge Accustick needle was then advanced under direct sonographic guidance into the lower pole of the right kidney. A 0.018 inch wire was advanced under fluoroscopic guidance into the left renal collecting system. The Accustick sheath was then advanced over the wire and a 0.018 system exchanged for a 0.035 system. Gentle hand injection of contrast material confirms placement of the sheath within the renal collecting system. An antegrade nephrostogram was performed confirming severe hydroureteronephrosis. There is complete obstruction of the distal ureter at the level of the mid sacral ala. The tract from the scan into the renal collecting system was then dilated serially to 10-French. A 10-French Cook all-purpose drain was then placed and positioned under fluoroscopic guidance. The locking loop is well formed within the left renal pelvis. The catheter was secured to the skin with 2-0 Prolene and a sterile bandage was placed. Catheter was left to gravity bag drainage.  COMPLICATIONS: None immediate.  IMPRESSION: 1. Moderate  left hydronephrosis with evidence of forniceal rupture resulting in moderate perinephric fluid. 2. Severe right hydroureteronephrosis. 3. Successful placement of bilateral 10 French percutaneous nephrostomy tubes. 4. Bilateral percutaneous nephrostograms  demonstrate complete obstruction of both ureters at the level of the mid sacrum consistent with malignant obstructive uropathy.  PLAN: Maintained tubes to gravity drainage and follow creatinine. Hematuria is expected to clear over 24-48 hr. Given presumed local recurrence of colon carcinoma in the setting of active lung adenocarcinoma, these tubes will likely be present for the remainder of the patient's life. Once they have healed (4-6 weeks), an effort can be made to cross the obstructions for conversion to percutaneous nephroureteral stents, or double-J ureteral stents if internalization is desired.  Signed,  Criselda Peaches, MD  [facility]  [place] [facility] Radiology   Electronically Signed   By: Jacqulynn Cadet M.D.   On: 04/28/2014 08:01   Ir Perc Nephrostomy Left  04/28/2014   CLINICAL DATA:  73 year old male with a past history of colonic adenocarcinoma treated with neoadjuvant chemoradiation followed by resection and colostomy. 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 He was transferred to the emergency department today from Radiation Oncology for progressive left flank pain, decreased urine output, nausea and vomiting. 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 There is evidence of forniceal rupture on the left with marked perinephric fluid. His baseline creatinine is 0.8 has markedly increased 2 7. He is in acute renal failure and requires emergent bilateral percutaneous nephrostomy tube placement for renal decompression and salvage of renal function.  EXAM: PERC NEPHROSTOMY*L*; RIGHT NEPHROSTOGRAM; PERC NEPHROSTOMY*R*; IR ULTRASOUND GUIDANCE; LEFT NEPHROSTOGRAM  Date: 04/28/2014  PROCEDURE: 1. Ultrasound-guided puncture of the left renal collecting system 2. Left antegrade percutaneous nephrostogram 3. Placement of a 10 French percutaneous  nephrostomy tube under fluoroscopic guidance 4. Ultrasound guided puncture of the right renal collecting system 5. Right antegrade percutaneous nephrostogram 6. Placement of a 10 French percutaneous nephrostomy tube under fluoroscopic guidance Interventional Radiologist:  Criselda Peaches, MD  ANESTHESIA/SEDATION: Moderate (conscious) sedation was used. 2.5 mg Versed, 100 mcg Fentanyl were administered intravenously. The patient's vital signs were monitored continuously by radiology nursing throughout the procedure.  Sedation Time: 30 minutes  2g Ancef were administered intravenously within 1 hr of skin incision.  FLUOROSCOPY TIME:  4 minutes 12 seconds  CONTRAST:  90mL OMNIPAQUE IOHEXOL 300 MG/ML  SOLN  TECHNIQUE: Informed consent was obtained from the patient following explanation of the procedure, risks, benefits and alternatives. The patient understands, agrees and consents for the procedure. All questions were addressed. A time out was performed.  Maximal barrier sterile technique utilized including caps, mask, sterile gowns, sterile gloves, large sterile drape, hand hygiene, and Betadine skin prep.  Attention was first turned to the left flank. The left flank was interrogated with ultrasound. The kidney is moderately hydronephrotic. There is marked perinephric fluid and stranding consistent with the findings on the recent CT scan. A suitable access site on the skin overlying the lower pole, posterior calix was identified. After local anesthesia was achieved, a small skin nick was made with an 11 blade scalpel. A 21 gauge Accustick needle was then advanced under direct sonographic guidance into the lower pole of the left kidney. A 0.018 inch wire was advanced under fluoroscopic guidance into the left renal collecting system. The Accustick sheath was then advanced over the wire and a 0.018 system exchanged for  a 0.035 system. Gentle hand injection of contrast material confirms placement of the sheath within  the renal collecting system. An antegrade nephrostogram was performed confirming moderate hydroureteronephrosis. There is complete obstruction of the distal ureter at the level of the mid sacral ala. The tract from the scan into the renal collecting system was then dilated serially to 10-French. A 10-French Cook all-purpose drain was then placed and positioned under fluoroscopic guidance. The locking loop is well formed within the left renal pelvis. The catheter was secured to the skin with 2-0 Prolene and a sterile bandage was placed. Catheter was left to gravity bag drainage.  Attention was next turned to the right flank. The right flank was interrogated with ultrasound. The right kidney is markedly hydronephrotic. No significant perinephric fluid. A suitable access site on the skin overlying the lower pole, posterior calix was identified. After local anesthesia was achieved, a small skin nick was made with an 11 blade scalpel. A 21 gauge Accustick needle was then advanced under direct sonographic guidance into the lower pole of the right kidney. A 0.018 inch wire was advanced under fluoroscopic guidance into the left renal collecting system. The Accustick sheath was then advanced over the wire and a 0.018 system exchanged for a 0.035 system. Gentle hand injection of contrast material confirms placement of the sheath within the renal collecting system. An antegrade nephrostogram was performed confirming severe hydroureteronephrosis. There is complete obstruction of the distal ureter at the level of the mid sacral ala. The tract from the scan into the renal collecting system was then dilated serially to 10-French. A 10-French Cook all-purpose drain was then placed and positioned under fluoroscopic guidance. The locking loop is well formed within the left renal pelvis. The catheter was secured to the skin with 2-0 Prolene and a sterile bandage was placed. Catheter was left to gravity bag drainage.  COMPLICATIONS:  None immediate.  IMPRESSION: 1. Moderate left hydronephrosis with evidence of forniceal rupture resulting in moderate perinephric fluid. 2. Severe right hydroureteronephrosis. 3. Successful placement of bilateral 10 French percutaneous nephrostomy tubes. 4. Bilateral percutaneous nephrostograms demonstrate complete obstruction of both ureters at the level of the mid sacrum consistent with malignant obstructive uropathy.  PLAN: Maintained tubes to gravity drainage and follow creatinine. Hematuria is expected to clear over 24-48 hr. Given presumed local recurrence of colon carcinoma in the setting of active lung adenocarcinoma, these tubes will likely be present for the remainder of the patient's life. Once they have healed (4-6 weeks), an effort can be made to cross the obstructions for conversion to percutaneous nephroureteral stents, or double-J ureteral stents if internalization is desired.  Signed,  Criselda Peaches, MD  [facility]  [facility] Radiology   Electronically Signed   By: Jacqulynn Cadet M.D.   On: 04/28/2014 08:01   Ir Perc Nephrostomy Right  04/28/2014   CLINICAL DATA:  73 year old male with a past history of colonic adenocarcinoma treated with neoadjuvant chemoradiation followed by resection and colostomy. 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 He was transferred to the emergency department today from Radiation Oncology for progressive left flank pain, decreased urine output, nausea and vomiting. 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 There is evidence of forniceal rupture on the left with marked perinephric fluid. His baseline creatinine is 0.8 has markedly increased 2 7. He is in acute renal failure and requires emergent bilateral percutaneous nephrostomy tube  placement for renal decompression and salvage of renal  function.  EXAM: PERC NEPHROSTOMY*L*; RIGHT NEPHROSTOGRAM; PERC NEPHROSTOMY*R*; IR ULTRASOUND GUIDANCE; LEFT NEPHROSTOGRAM  Date: 04/28/2014  PROCEDURE: 1. Ultrasound-guided puncture of the left renal collecting system 2. Left antegrade percutaneous nephrostogram 3. Placement of a 10 French percutaneous nephrostomy tube under fluoroscopic guidance 4. Ultrasound guided puncture of the right renal collecting system 5. Right antegrade percutaneous nephrostogram 6. Placement of a 10 French percutaneous nephrostomy tube under fluoroscopic guidance Interventional Radiologist:  Criselda Peaches, MD  ANESTHESIA/SEDATION: Moderate (conscious) sedation was used. 2.5 mg Versed, 100 mcg Fentanyl were administered intravenously. The patient's vital signs were monitored continuously by radiology nursing throughout the procedure.  Sedation Time: 30 minutes  2g Ancef were administered intravenously within 1 hr of skin incision.  FLUOROSCOPY TIME:  4 minutes 12 seconds  CONTRAST:  20mL OMNIPAQUE IOHEXOL 300 MG/ML  SOLN  TECHNIQUE: Informed consent was obtained from the patient following explanation of the procedure, risks, benefits and alternatives. The patient understands, agrees and consents for the procedure. All questions were addressed. A time out was performed.  Maximal barrier sterile technique utilized including caps, mask, sterile gowns, sterile gloves, large sterile drape, hand hygiene, and Betadine skin prep.  Attention was first turned to the left flank. The left flank was interrogated with ultrasound. The kidney is moderately hydronephrotic. There is marked perinephric fluid and stranding consistent with the findings on the recent CT scan. A suitable access site on the skin overlying the lower pole, posterior calix was identified. After local anesthesia was achieved, a small skin nick was made with an 11 blade scalpel. A 21 gauge Accustick needle was then advanced under direct sonographic guidance into the lower pole  of the left kidney. A 0.018 inch wire was advanced under fluoroscopic guidance into the left renal collecting system. The Accustick sheath was then advanced over the wire and a 0.018 system exchanged for a 0.035 system. Gentle hand injection of contrast material confirms placement of the sheath within the renal collecting system. An antegrade nephrostogram was performed confirming moderate hydroureteronephrosis. There is complete obstruction of the distal ureter at the level of the mid sacral ala. The tract from the scan into the renal collecting system was then dilated serially to 10-French. A 10-French Cook all-purpose drain was then placed and positioned under fluoroscopic guidance. The locking loop is well formed within the left renal pelvis. The catheter was secured to the skin with 2-0 Prolene and a sterile bandage was placed. Catheter was left to gravity bag drainage.  Attention was next turned to the right flank. The right flank was interrogated with ultrasound. The right kidney is markedly hydronephrotic. No significant perinephric fluid. A suitable access site on the skin overlying the lower pole, posterior calix was identified. After local anesthesia was achieved, a small skin nick was made with an 11 blade scalpel. A 21 gauge Accustick needle was then advanced under direct sonographic guidance into the lower pole of the right kidney. A 0.018 inch wire was advanced under fluoroscopic guidance into the left renal collecting system. The Accustick sheath was then advanced over the wire and a 0.018 system exchanged for a 0.035 system. Gentle hand injection of contrast material confirms placement of the sheath within the renal collecting system. An antegrade nephrostogram was performed confirming severe hydroureteronephrosis. There is complete obstruction of the distal ureter at the level of the mid sacral ala. The tract from the scan into the renal collecting system was then dilated serially to  10-French. A  10-French Cook all-purpose drain was then placed and positioned under fluoroscopic guidance. The locking loop is well formed within the left renal pelvis. The catheter was secured to the skin with 2-0 Prolene and a sterile bandage was placed. Catheter was left to gravity bag drainage.  COMPLICATIONS: None immediate.  IMPRESSION: 1. Moderate left hydronephrosis with evidence of forniceal rupture resulting in moderate perinephric fluid. 2. Severe right hydroureteronephrosis. 3. Successful placement of bilateral 10 French percutaneous nephrostomy tubes. 4. Bilateral percutaneous nephrostograms demonstrate complete obstruction of both ureters at the level of the mid sacrum consistent with malignant obstructive uropathy.  PLAN: Maintained tubes to gravity drainage and follow creatinine. Hematuria is expected to clear over 24-48 hr. Given presumed local recurrence of colon carcinoma in the setting of active lung adenocarcinoma, these tubes will likely be present for the remainder of the patient's life. Once they have healed (4-6 weeks), an effort can be made to cross the obstructions for conversion to percutaneous nephroureteral stents, or double-J ureteral stents if internalization is desired.  Signed,  Criselda Peaches, MD  [facility]  [facility] Radiology   Electronically Signed   By: Jacqulynn Cadet M.D.   On: 04/28/2014 08:01   Ir US Guide Bx Asp/drain  04/28/2014   CLINICAL DATA:  73 year old male with a past history of colonic adenocarcinoma treated with neoadjuvant chemoradiation followed by resection and colostomy. 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 He was transferred to the emergency department today from Radiation Oncology for progressive left flank pain, decreased urine output, nausea and vomiting. 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 There is evidence of forniceal rupture on the left with marked perinephric fluid. His baseline creatinine is 0.8 has markedly increased 2 7. He is in acute renal failure and requires emergent bilateral percutaneous nephrostomy tube placement for renal decompression and salvage of renal function.  EXAM: PERC NEPHROSTOMY*L*; RIGHT NEPHROSTOGRAM; PERC NEPHROSTOMY*R*; IR ULTRASOUND GUIDANCE; LEFT NEPHROSTOGRAM  Date: 04/28/2014  PROCEDURE: 1. Ultrasound-guided puncture of the left renal collecting system 2. Left antegrade percutaneous nephrostogram 3. Placement of a 10 French percutaneous nephrostomy tube under fluoroscopic guidance 4. Ultrasound guided puncture of the right renal collecting system 5. Right antegrade percutaneous nephrostogram 6. Placement of a 10 French percutaneous nephrostomy tube under fluoroscopic guidance Interventional Radiologist:  Criselda Peaches, MD  ANESTHESIA/SEDATION: Moderate (conscious) sedation was used. 2.5 mg Versed, 100 mcg Fentanyl were administered intravenously. The patient's vital signs were monitored continuously by radiology nursing throughout the procedure.  Sedation Time: 30 minutes  2g Ancef were administered intravenously within 1 hr of skin incision.  FLUOROSCOPY TIME:  4 minutes 12 seconds  CONTRAST:  82mL OMNIPAQUE IOHEXOL 300 MG/ML  SOLN  TECHNIQUE: Informed consent was obtained from the patient following explanation of the procedure, risks, benefits and alternatives. The patient understands, agrees and consents for the procedure. All questions were addressed. A time out was performed.  Maximal barrier sterile technique utilized including caps, mask, sterile gowns, sterile gloves, large sterile drape, hand hygiene, and Betadine skin prep.  Attention was first turned to the left flank. The left flank was interrogated with ultrasound. The kidney is moderately hydronephrotic. There is marked perinephric fluid and stranding consistent with the findings on  the recent CT scan. A suitable access site on the skin overlying the lower pole, posterior calix was identified. After local anesthesia was achieved, a small skin  nick was made with an 11 blade scalpel. A 21 gauge Accustick needle was then advanced under direct sonographic guidance into the lower pole of the left kidney. A 0.018 inch wire was advanced under fluoroscopic guidance into the left renal collecting system. The Accustick sheath was then advanced over the wire and a 0.018 system exchanged for a 0.035 system. Gentle hand injection of contrast material confirms placement of the sheath within the renal collecting system. An antegrade nephrostogram was performed confirming moderate hydroureteronephrosis. There is complete obstruction of the distal ureter at the level of the mid sacral ala. The tract from the scan into the renal collecting system was then dilated serially to 10-French. A 10-French Cook all-purpose drain was then placed and positioned under fluoroscopic guidance. The locking loop is well formed within the left renal pelvis. The catheter was secured to the skin with 2-0 Prolene and a sterile bandage was placed. Catheter was left to gravity bag drainage.  Attention was next turned to the right flank. The right flank was interrogated with ultrasound. The right kidney is markedly hydronephrotic. No significant perinephric fluid. A suitable access site on the skin overlying the lower pole, posterior calix was identified. After local anesthesia was achieved, a small skin nick was made with an 11 blade scalpel. A 21 gauge Accustick needle was then advanced under direct sonographic guidance into the lower pole of the right kidney. A 0.018 inch wire was advanced under fluoroscopic guidance into the left renal collecting system. The Accustick sheath was then advanced over the wire and a 0.018 system exchanged for a 0.035 system. Gentle hand injection of contrast material confirms placement of the sheath  within the renal collecting system. An antegrade nephrostogram was performed confirming severe hydroureteronephrosis. There is complete obstruction of the distal ureter at the level of the mid sacral ala. The tract from the scan into the renal collecting system was then dilated serially to 10-French. A 10-French Cook all-purpose drain was then placed and positioned under fluoroscopic guidance. The locking loop is well formed within the left renal pelvis. The catheter was secured to the skin with 2-0 Prolene and a sterile bandage was placed. Catheter was left to gravity bag drainage.  COMPLICATIONS: None immediate.  IMPRESSION: 1. Moderate left hydronephrosis with evidence of forniceal rupture resulting in moderate perinephric fluid. 2. Severe right hydroureteronephrosis. 3. Successful placement of bilateral 10 French percutaneous nephrostomy tubes. 4. Bilateral percutaneous nephrostograms demonstrate complete obstruction of both ureters at the level of the mid sacrum consistent with malignant obstructive uropathy.  PLAN: Maintained tubes to gravity drainage and follow creatinine. Hematuria is expected to clear over 24-48 hr. Given presumed local recurrence of colon carcinoma in the setting of active lung adenocarcinoma, these tubes will likely be present for the remainder of the patient's life. Once they have healed (4-6 weeks), an effort can be made to cross the obstructions for conversion to percutaneous nephroureteral stents, or double-J ureteral stents if internalization is desired.  Signed,  Criselda Peaches, MD  [facility]  [facility] Radiology   Electronically Signed   By: Jacqulynn Cadet M.D.   On: 04/28/2014 08:01   Ir US Guide Bx Asp/drain  04/28/2014   CLINICAL DATA:  73 year old male with a past history of colonic adenocarcinoma treated with neoadjuvant chemoradiation followed by resection and colostomy. Additionally, he has active lung adenocarcinoma which  is unresectable for which she is undergoing radiation therapy. He was transferred to the emergency department today from Radiation Oncology  for progressive left flank pain, decreased urine output, nausea and vomiting. 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 There is evidence of forniceal rupture on the left with marked perinephric fluid. His baseline creatinine is 0.8 has markedly increased 2 7. He is in acute renal failure and requires emergent bilateral percutaneous nephrostomy tube placement for renal decompression and salvage of renal function.  EXAM: PERC NEPHROSTOMY*L*; RIGHT NEPHROSTOGRAM; PERC NEPHROSTOMY*R*; IR ULTRASOUND GUIDANCE; LEFT NEPHROSTOGRAM  Date: 04/28/2014  PROCEDURE: 1. Ultrasound-guided puncture of the left renal collecting system 2. Left antegrade percutaneous nephrostogram 3. Placement of a 10 French percutaneous nephrostomy tube under fluoroscopic guidance 4. Ultrasound guided puncture of the right renal collecting system 5. Right antegrade percutaneous nephrostogram 6. Placement of a 10 French percutaneous nephrostomy tube under fluoroscopic guidance Interventional Radiologist:  Criselda Peaches, MD  ANESTHESIA/SEDATION: Moderate (conscious) sedation was used. 2.5 mg Versed, 100 mcg Fentanyl were administered intravenously. The patient's vital signs were monitored continuously by radiology nursing throughout the procedure.  Sedation Time: 30 minutes  2g Ancef were administered intravenously within 1 hr of skin incision.  FLUOROSCOPY TIME:  4 minutes 12 seconds  CONTRAST:  51mL OMNIPAQUE IOHEXOL 300 MG/ML  SOLN  TECHNIQUE: Informed consent was obtained from the patient following explanation of the procedure, risks, benefits and alternatives. The patient understands, agrees and consents for the procedure. All questions were addressed. A time out was performed.  Maximal barrier sterile technique utilized including caps, mask, sterile  gowns, sterile gloves, large sterile drape, hand hygiene, and Betadine skin prep.  Attention was first turned to the left flank. The left flank was interrogated with ultrasound. The kidney is moderately hydronephrotic. There is marked perinephric fluid and stranding consistent with the findings on the recent CT scan. A suitable access site on the skin overlying the lower pole, posterior calix was identified. After local anesthesia was achieved, a small skin nick was made with an 11 blade scalpel. A 21 gauge Accustick needle was then advanced under direct sonographic guidance into the lower pole of the left kidney. A 0.018 inch wire was advanced under fluoroscopic guidance into the left renal collecting system. The Accustick sheath was then advanced over the wire and a 0.018 system exchanged for a 0.035 system. Gentle hand injection of contrast material confirms placement of the sheath within the renal collecting system. An antegrade nephrostogram was performed confirming moderate hydroureteronephrosis. There is complete obstruction of the distal ureter at the level of the mid sacral ala. The tract from the scan into the renal collecting system was then dilated serially to 10-French. A 10-French Cook all-purpose drain was then placed and positioned under fluoroscopic guidance. The locking loop is well formed within the left renal pelvis. The catheter was secured to the skin with 2-0 Prolene and a sterile bandage was placed. Catheter was left to gravity bag drainage.  Attention was next turned to the right flank. The right flank was interrogated with ultrasound. The right kidney is markedly hydronephrotic. No significant perinephric fluid. A suitable access site on the skin overlying the lower pole, posterior calix was identified. After local anesthesia was achieved, a small skin nick was made with an 11 blade scalpel. A 21 gauge Accustick needle was then advanced under direct sonographic guidance into the lower pole  of the right kidney. A 0.018 inch wire was advanced under fluoroscopic guidance into the left renal collecting system. The Accustick sheath was then advanced over the wire and a 0.018 system exchanged  for a 0.035 system. Gentle hand injection of contrast material confirms placement of the sheath within the renal collecting system. An antegrade nephrostogram was performed confirming severe hydroureteronephrosis. There is complete obstruction of the distal ureter at the level of the mid sacral ala. The tract from the scan into the renal collecting system was then dilated serially to 10-French. A 10-French Cook all-purpose drain was then placed and positioned under fluoroscopic guidance. The locking loop is well formed within the left renal pelvis. The catheter was secured to the skin with 2-0 Prolene and a sterile bandage was placed. Catheter was left to gravity bag drainage.  COMPLICATIONS: None immediate.  IMPRESSION: 1. Moderate left hydronephrosis with evidence of forniceal rupture resulting in moderate perinephric fluid. 2. Severe right hydroureteronephrosis. 3. Successful placement of bilateral 10 French percutaneous nephrostomy tubes. 4. Bilateral percutaneous nephrostograms demonstrate complete obstruction of both ureters at the level of the mid sacrum consistent with malignant obstructive uropathy.  PLAN: Maintained tubes to gravity drainage and follow creatinine. Hematuria is expected to clear over 24-48 hr. Given presumed local recurrence of colon carcinoma in the setting of active lung adenocarcinoma, these tubes will likely be present for the remainder of the patient's life. Once they have healed (4-6 weeks), an effort can be made to cross the obstructions for conversion to percutaneous nephroureteral stents, or double-J ureteral stents if internalization is desired.  Signed,  Criselda Peaches, MD  [facility]  [facility]   Electronically Signed   By:  Jacqulynn Cadet M.D.   On: 04/28/2014 08:01        Scheduled Meds: . feeding supplement (RESOURCE BREEZE)  1 Container Oral TID BM  . heparin  5,000 Units Subcutaneous 3 times per day  . pantoprazole  40 mg Oral Daily  . potassium chloride  30 mEq Oral Q3H  . sodium chloride  3 mL Intravenous Q12H  . sucralfate  1 g Rectal TID   Continuous Infusions: . 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 [facility] Pager [phone]  If 7PM-7AM, please contact night-coverage [email or web address] Password [email or web address] 04/28/2014, 3:28 PM    LOS: 1 day

## 2014-04-28 NOTE — Progress Notes (Signed)
INITIAL NUTRITION ASSESSMENT  DOCUMENTATION CODES Per approved criteria  -Severe malnutrition in the context of chronic illness -Underweight   INTERVENTION: Resource Breeze po TID, each supplement provides 250 kcal and 9 grams of protein RD to follow for nutrition care plan  NUTRITION DIAGNOSIS: Increased nutrient needs related to catabolic illness as evidenced by estimated nutrition needs   Goal: Pt to meet >/= 90% of their estimated nutrition needs   Monitor:  PO & supplemental intake, weight, labs, I/O's  Reason for Assessment: Consult, Malnutrition Screening Tool Report  73 y.o. male  Admitting Dx: left flank pain  ASSESSMENT: 73 y.o. Male currently treated for lung cancer who presented to [facility] ED earlier today after being called by his radiation oncologist as CT abd and pelvis demonstrated bilateral hydronephrosis and new worsening sacral mass.   In ED CT abd with high-grade bilateral hydroureteronephrosis, being produced by the complex mass developing in his pelvis. 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   Patient s/p procedure 5/22: IR PERC NEPHROSTOMY LEFT  Patient seen per Clinical Nutrition during previous admission in beginning of May 2015 at [facility]; RD spoke with patient's wife re: nutrition hx -- reports pt "hardly eats anything" and at times he vomits what he consumes; PO intake 25% per flowsheet records; does not like Ensure Complete supplements; will order Resource Breeze to see if patient likes.  Nutrition Focused Physical Exam:   Subcutaneous Fat:  Orbital Region: N/A Upper Arm Region: severe depletion Thoracic and Lumbar Region: N/A  Muscle:  Temple Region: moderate depletion Clavicle Bone Region: moderate depletion Clavicle and Acromion Bone Region: moderate depletion Scapular Bone Region: N/A  Dorsal Hand: N/A  Patellar Region: severe depletion Anterior Thigh Region: severe  depletion Posterior Calf Region: severe depletion  Edema: bilateral lower extremity   Patient continues to meet criteria for severe malnutrition in the context of chronic illness as evidenced by < 75% intake of estimated energy requirement for > 1 month, severe muscle loss and severe subcutaneous fat loss.  Height: Ht Readings from Last 1 Encounters:  04/27/14 5' 9.5" (1.765 m)    Weight: Wt Readings from Last 1 Encounters:  04/27/14 120 lb 6.4 oz (54.613 kg)    Ideal Body Weight: 160 lb  % Ideal Body Weight: 75%  Wt Readings from Last 10 Encounters:  04/27/14 120 lb 6.4 oz (54.613 kg)  04/27/14 119 lb 3.2 oz (54.069 kg)  04/26/14 119 lb 3.2 oz (54.069 kg)  04/19/14 117 lb (53.071 kg)  04/13/14 117 lb 11.6 oz (53.4 kg)  04/09/14 118 lb (53.524 kg)  04/09/14 118 lb (53.524 kg)  04/05/14 122 lb (55.339 kg)  04/03/14 123 lb (55.792 kg)  03/26/14 124 lb 9.6 oz (56.518 kg)    Usual Body Weight: 134 lbs per pt  % Usual Body Weight: 90%  BMI:  Body mass index is 17.53 kg/(m^2).  Estimated Nutritional Needs: Kcal: 1850-2050 Protein: 90-100 gm Fluid: 1.8-2.0 L  Skin: Intact  Diet Order: General  EDUCATION NEEDS: -No education needs identified at this time   Intake/Output Summary (Last 24 hours) at 04/28/14 1111 Last data filed at 04/28/14 0830  Gross per 24 hour  Intake 931.25 ml  Output   2550 ml  Net -1618.75 ml    Labs:   Recent Labs Lab 04/27/14 1710 04/27/14 1720 04/28/14 0459  NA 135* 138 141  K 3.4* 3.3* 3.0*  CL 96 102 108  CO2 18*  --  19  BUN 45* 42* 35*  CREATININE 6.74* 7.00* 4.57*  CALCIUM 8.4  --  7.8*  GLUCOSE 90 86 88    Scheduled Meds: . heparin  5,000 Units Subcutaneous 3 times per day  . pantoprazole  40 mg Oral Daily  . potassium chloride  40 mEq Oral Once  . sodium chloride  3 mL Intravenous Q12H  . sucralfate  1 g Rectal TID    Continuous Infusions: . sodium chloride 75 mL/hr at 04/28/14 0011    Past Medical  History  Diagnosis Date  . Emphysema/COPD   . Rectal pain   . Colostomy in place   . Proctitis   . Hypotonic bladder   . Incomplete emptying of bladder   . History of malignant neoplasm of rectum     1996-- (T3 N1)   S/P  RESECTION WITH COLOSTOMY/   CHEMORADATION THERAPY  . Pelvic mass   . History of radiation therapy     RIGHT UPPER LUNG LOBE--   01-30-2014  to  03-07-2014  (45 gray)  . GERD (gastroesophageal reflux disease)   . Wears glasses   . Wears dentures   . Adenocarcinoma of lung     RIGHT UPPER LOBE (stage IIB , T2  N0, M0)--  DX JAN 2015  S/P  RADIATION TX  (COMPLETE  03-07-2014)    Past Surgical History  Procedure Laterality Date  . Nasal septum surgery  1972  . Exploratory laparotomy /  lysis adhesions  AUG  1997    CHRONIC BOWEL OBSTRUCTION  . Small bowel resection and repair colostomy  JAN  2000  . Colonoscopy w/ polypectomy  03-16-2014  . Low anterior resection with primary anastomosis/  trnasverse loop colostomy with division  11-10-1995  . Examination under anesthesia N/A 04/09/2014    Procedure: EXAM UNDER ANESTHESIA, FLEX SIGMOIDOSCOPYM POSSIBLE BIOPSY;  Surgeon: Leighton Ruff, MD;  Location: [facility];  Service: General;  Laterality: N/A;   Arthur Holms, RD, LDN Pager #: [phone] After-Hours Pager #: [phone]

## 2014-04-28 NOTE — Progress Notes (Signed)
Patient had a run of SVT this morning, patient asymptomatic, came back to sinus rhythm. Notified NP on call. Will continue to monitor patient.

## 2014-04-28 NOTE — Progress Notes (Signed)
Subjective: Pt feels better than yesterday. Tried to eat some breakfast, but not much appetite. Denies much pain at new PCN sites  Objective: Physical Exam: BP 147/83  Pulse 80  Temp(Src) 99 F (37.2 C) (Oral)  Resp 15  Ht 5' 9.5" (1.765 m)  Wt 120 lb 6.4 oz (54.613 kg)  BMI 17.53 kg/m2  SpO2 93% Alert, no distress Abd, soft, NT (R)PCN intact, site clean, dry, NT.  Output pale yellow urine 1090mL last 24h, bag almost full this am (L)PCN intact, site clean, dry, NT. Output yellow urine, sl blood tinge, no clots. 932mL last 24h, bag almost full this am. Cr down to 4.5 today   Labs: CBC  Recent Labs  04/27/14 1710 04/27/14 1720 04/28/14 0459  WBC 9.6  --  9.8  HGB 8.7* 8.2* 7.1*  HCT 26.1* 24.0* 21.0*  PLT PLATELET CLUMPS NOTED ON SMEAR, COUNT APPEARS INCREASED  --  253   BMET  Recent Labs  04/27/14 1710 04/27/14 1720 04/28/14 0459  NA 135* 138 141  K 3.4* 3.3* 3.0*  CL 96 102 108  CO2 18*  --  19  GLUCOSE 90 86 88  BUN 45* 42* 35*  CREATININE 6.74* 7.00* 4.57*  CALCIUM 8.4  --  7.8*   LFT No results found for this basename: PROT, ALBUMIN, AST, ALT, ALKPHOS, BILITOT, BILIDIR, IBILI, LIPASE,  in the last 72 hours PT/INR  Recent Labs  04/27/14 1710  LABPROT 14.2  INR 1.12     Studies/Results: Ct Abdomen Pelvis W Contrast  04/27/2014   CLINICAL DATA:  Left flank pain.  History of colon cancer.  EXAM: CT ABDOMEN AND PELVIS WITH CONTRAST  TECHNIQUE: Multidetector CT imaging of the abdomen and pelvis was performed using the standard protocol following bolus administration of intravenous contrast.  CONTRAST:  163mL OMNIPAQUE IOHEXOL 300 MG/ML  SOLN  COMPARISON:  CT abdomen pelvis 03/21/2014.  FINDINGS: Visualization of the lower thorax demonstrates new small left pleural effusion. Normal heart size.  Unchanged multiple sub cm low-attenuation lesions within the liver. Focal fatty deposition adjacent to the falciform ligament. Hepatic and portal veins are patent.  Gallbladder is unremarkable. Spleen, pancreas and bilateral adrenal glands are unremarkable.  Patient status post low anterior resection. 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 Adjacent to the resection site within the low anatomic pelvis is a complex soft tissue, gas and fluid containing mass located anterior to and adjacent to the sacrum measuring 8.2 x 5.7 cm, slightly increased when compared to prior examination. Interval increase in now moderate amount of ascites.  There is new severe bilateral hydroureteronephrosis with bilateral delayed nephrograms, worse within the right kidney with extensive left-greater-than-right perinephric fluid and fat stranding. Delayed phase images demonstrate excretion of contrast within the left renal collecting system. No excretion of contrast is demonstrated within the right renal collecting system. This new marked hydroureteronephrosis likely secondary to the worsening above described pelvic soft tissue, fluid and gas collection. The urinary bladder is distended.  Re- demonstrated nonspecific 1.1 cm enhancing mass along the posterior left lateral aspect of the urinary bladder. Central dystrophic calcifications within the prostate. Oral contrast material is demonstrated throughout the small bowel. No definite evidence for bowel obstruction.  No definite aggressive or acute appearing osseous lesions. Mild mottled appearance of the sacrum. Bilateral L5 pars defects.  IMPRESSION: 1. Interval worsening of large irregular soft tissue mass fluid/gas collection within the presacral space and involving parasacral musculature. Findings may be secondary  to an inflammatory/infectious mass however given the history of malignancy, local recurrence is a consideration. 2. There is now involvement of the distal aspect of the bilateral ureters by the large soft tissue mass within the presacral region causing severe bilateral  hydronephrosis, right-greater-than-left with extensive perinephric fat stranding as well as delayed nephrograms. No excretion from the right kidney with minimal excretion from the left kidney compatible with high-grade obstruction. 3. 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 4. Interval increase in moderate ascites and anasarca. 5. New small left pleural effusion. 6. Nonspecific enhancing mass along the posterior lateral aspect of the bladder wall potentially secondary to infectious or inflammatory process, ureterocele or metastatic lesion. 7. Mild mottled appearance of the sacrum. Osteomyelitis not excluded. These results were called by telephone at the time of interpretation on 04/27/2014 at 3:33 PM to Dr. Tyler Pita , who verbally acknowledged these results.   Electronically Signed   By: Lovey Newcomer M.D.   On: 04/27/2014 15:53   Ir Nephrostogram Left  04/28/2014   CLINICAL DATA:  73 year old male with a past history of colonic adenocarcinoma treated with neoadjuvant chemoradiation followed by resection and colostomy. 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 He was transferred to the emergency department today from [facility] for progressive left flank pain, decreased urine output, nausea and vomiting. 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 There is evidence of forniceal rupture on the left with marked perinephric fluid. His baseline creatinine is 0.8 has markedly increased 2 7. He is in acute renal failure and requires emergent bilateral percutaneous nephrostomy tube placement for renal decompression and salvage of renal function.  EXAM: PERC NEPHROSTOMY*L*; RIGHT NEPHROSTOGRAM; PERC NEPHROSTOMY*R*; IR ULTRASOUND GUIDANCE; LEFT NEPHROSTOGRAM  Date: 04/28/2014  PROCEDURE: 1. Ultrasound-guided puncture of the left renal collecting system 2.  Left antegrade percutaneous nephrostogram 3. Placement of a 10 French percutaneous nephrostomy tube under fluoroscopic guidance 4. Ultrasound guided puncture of the right renal collecting system 5. Right antegrade percutaneous nephrostogram 6. Placement of a 10 French percutaneous nephrostomy tube under fluoroscopic guidance Interventional Radiologist:  Criselda Peaches, MD  ANESTHESIA/SEDATION: Moderate (conscious) sedation was used. 2.5 mg Versed, 100 mcg Fentanyl were administered intravenously. The patient's vital signs were monitored continuously by radiology nursing throughout the procedure.  Sedation Time: 30 minutes  2g Ancef were administered intravenously within 1 hr of skin incision.  FLUOROSCOPY TIME:  4 minutes 12 seconds  CONTRAST:  49mL OMNIPAQUE IOHEXOL 300 MG/ML  SOLN  TECHNIQUE: Informed consent was obtained from the patient following explanation of the procedure, risks, benefits and alternatives. The patient understands, agrees and consents for the procedure. All questions were addressed. A time out was performed.  Maximal barrier sterile technique utilized including caps, mask, sterile gowns, sterile gloves, large sterile drape, hand hygiene, and Betadine skin prep.  Attention was first turned to the left flank. The left flank was interrogated with ultrasound. The kidney is moderately hydronephrotic. There is marked perinephric fluid and stranding consistent with the findings on the recent CT scan. A suitable access site on the skin overlying the lower pole, posterior calix was identified. After local anesthesia was achieved, a small skin nick was made with an 11 blade scalpel. A 21 gauge Accustick needle was then advanced under direct sonographic guidance into the lower pole of the left kidney. A 0.018 inch wire was advanced under fluoroscopic guidance into the left renal collecting system.  The Accustick sheath was then advanced over the wire and a 0.018 system exchanged for a 0.035 system.  Gentle hand injection of contrast material confirms placement of the sheath within the renal collecting system. An antegrade nephrostogram was performed confirming moderate hydroureteronephrosis. There is complete obstruction of the distal ureter at the level of the mid sacral ala. The tract from the scan into the renal collecting system was then dilated serially to 10-French. A 10-French Cook all-purpose drain was then placed and positioned under fluoroscopic guidance. The locking loop is well formed within the left renal pelvis. The catheter was secured to the skin with 2-0 Prolene and a sterile bandage was placed. Catheter was left to gravity bag drainage.  Attention was next turned to the right flank. The right flank was interrogated with ultrasound. The right kidney is markedly hydronephrotic. No significant perinephric fluid. A suitable access site on the skin overlying the lower pole, posterior calix was identified. After local anesthesia was achieved, a small skin nick was made with an 11 blade scalpel. A 21 gauge Accustick needle was then advanced under direct sonographic guidance into the lower pole of the right kidney. A 0.018 inch wire was advanced under fluoroscopic guidance into the left renal collecting system. The Accustick sheath was then advanced over the wire and a 0.018 system exchanged for a 0.035 system. Gentle hand injection of contrast material confirms placement of the sheath within the renal collecting system. An antegrade nephrostogram was performed confirming severe hydroureteronephrosis. There is complete obstruction of the distal ureter at the level of the mid sacral ala. The tract from the scan into the renal collecting system was then dilated serially to 10-French. A 10-French Cook all-purpose drain was then placed and positioned under fluoroscopic guidance. The locking loop is well formed within the left renal pelvis. The catheter was secured to the skin with 2-0 Prolene and a  sterile bandage was placed. Catheter was left to gravity bag drainage.  COMPLICATIONS: None immediate.  IMPRESSION: 1. Moderate left hydronephrosis with evidence of forniceal rupture resulting in moderate perinephric fluid. 2. Severe right hydroureteronephrosis. 3. Successful placement of bilateral 10 French percutaneous nephrostomy tubes. 4. Bilateral percutaneous nephrostograms demonstrate complete obstruction of both ureters at the level of the mid sacrum consistent with malignant obstructive uropathy.  PLAN: Maintained tubes to gravity drainage and follow creatinine. Hematuria is expected to clear over 24-48 hr. Given presumed local recurrence of colon carcinoma in the setting of active lung adenocarcinoma, these tubes will likely be present for the remainder of the patient's life. Once they have healed (4-6 weeks), an effort can be made to cross the obstructions for conversion to percutaneous nephroureteral stents, or double-J ureteral stents if internalization is desired.  Signed,  Criselda Peaches, MD  [facility]  [facility]   Electronically Signed   By: Jacqulynn Cadet M.D.   On: 04/28/2014 08:01   Ir Nephrostogram Right  04/28/2014   CLINICAL DATA:  73 year old male with a past history of colonic adenocarcinoma treated with neoadjuvant chemoradiation followed by resection and colostomy. 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 He was transferred to the emergency department today from [facility] for progressive left flank pain, decreased urine output, nausea and vomiting. 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 There is evidence of forniceal rupture on the left with marked perinephric fluid. His baseline creatinine is 0.8 has markedly increased 2  7. He is in acute renal failure and requires emergent bilateral  percutaneous nephrostomy tube placement for renal decompression and salvage of renal function.  EXAM: PERC NEPHROSTOMY*L*; RIGHT NEPHROSTOGRAM; PERC NEPHROSTOMY*R*; IR ULTRASOUND GUIDANCE; LEFT NEPHROSTOGRAM  Date: 04/28/2014  PROCEDURE: 1. Ultrasound-guided puncture of the left renal collecting system 2. Left antegrade percutaneous nephrostogram 3. Placement of a 10 French percutaneous nephrostomy tube under fluoroscopic guidance 4. Ultrasound guided puncture of the right renal collecting system 5. Right antegrade percutaneous nephrostogram 6. Placement of a 10 French percutaneous nephrostomy tube under fluoroscopic guidance Interventional Radiologist:  Criselda Peaches, MD  ANESTHESIA/SEDATION: Moderate (conscious) sedation was used. 2.5 mg Versed, 100 mcg Fentanyl were administered intravenously. The patient's vital signs were monitored continuously by radiology nursing throughout the procedure.  Sedation Time: 30 minutes  2g Ancef were administered intravenously within 1 hr of skin incision.  FLUOROSCOPY TIME:  4 minutes 12 seconds  CONTRAST:  51mL OMNIPAQUE IOHEXOL 300 MG/ML  SOLN  TECHNIQUE: Informed consent was obtained from the patient following explanation of the procedure, risks, benefits and alternatives. The patient understands, agrees and consents for the procedure. All questions were addressed. A time out was performed.  Maximal barrier sterile technique utilized including caps, mask, sterile gowns, sterile gloves, large sterile drape, hand hygiene, and Betadine skin prep.  Attention was first turned to the left flank. The left flank was interrogated with ultrasound. The kidney is moderately hydronephrotic. There is marked perinephric fluid and stranding consistent with the findings on the recent CT scan. A suitable access site on the skin overlying the lower pole, posterior calix was identified. After local anesthesia was achieved, a small skin nick was made with an 11 blade scalpel. A 21 gauge  Accustick needle was then advanced under direct sonographic guidance into the lower pole of the left kidney. A 0.018 inch wire was advanced under fluoroscopic guidance into the left renal collecting system. The Accustick sheath was then advanced over the wire and a 0.018 system exchanged for a 0.035 system. Gentle hand injection of contrast material confirms placement of the sheath within the renal collecting system. An antegrade nephrostogram was performed confirming moderate hydroureteronephrosis. There is complete obstruction of the distal ureter at the level of the mid sacral ala. The tract from the scan into the renal collecting system was then dilated serially to 10-French. A 10-French Cook all-purpose drain was then placed and positioned under fluoroscopic guidance. The locking loop is well formed within the left renal pelvis. The catheter was secured to the skin with 2-0 Prolene and a sterile bandage was placed. Catheter was left to gravity bag drainage.  Attention was next turned to the right flank. The right flank was interrogated with ultrasound. The right kidney is markedly hydronephrotic. No significant perinephric fluid. A suitable access site on the skin overlying the lower pole, posterior calix was identified. After local anesthesia was achieved, a small skin nick was made with an 11 blade scalpel. A 21 gauge Accustick needle was then advanced under direct sonographic guidance into the lower pole of the right kidney. A 0.018 inch wire was advanced under fluoroscopic guidance into the left renal collecting system. The Accustick sheath was then advanced over the wire and a 0.018 system exchanged for a 0.035 system. Gentle hand injection of contrast material confirms placement of the sheath within the renal collecting system. An antegrade nephrostogram was performed confirming severe hydroureteronephrosis. There is complete obstruction of the distal ureter at the level of the mid sacral ala. The  tract  from the scan into the renal collecting system was then dilated serially to 10-French. A 10-French Cook all-purpose drain was then placed and positioned under fluoroscopic guidance. The locking loop is well formed within the left renal pelvis. The catheter was secured to the skin with 2-0 Prolene and a sterile bandage was placed. Catheter was left to gravity bag drainage.  COMPLICATIONS: None immediate.  IMPRESSION: 1. Moderate left hydronephrosis with evidence of forniceal rupture resulting in moderate perinephric fluid. 2. Severe right hydroureteronephrosis. 3. Successful placement of bilateral 10 French percutaneous nephrostomy tubes. 4. Bilateral percutaneous nephrostograms demonstrate complete obstruction of both ureters at the level of the mid sacrum consistent with malignant obstructive uropathy.  PLAN: Maintained tubes to gravity drainage and follow creatinine. Hematuria is expected to clear over 24-48 hr. Given presumed local recurrence of colon carcinoma in the setting of active lung adenocarcinoma, these tubes will likely be present for the remainder of the patient's life. Once they have healed (4-6 weeks), an effort can be made to cross the obstructions for conversion to percutaneous nephroureteral stents, or double-J ureteral stents if internalization is desired.  Signed,  Criselda Peaches, MD  [facility]  [facility]   Electronically Signed   By: Jacqulynn Cadet M.D.   On: 04/28/2014 08:01   Ir Perc Nephrostomy Left  04/28/2014   CLINICAL DATA:  73 year old male with a past history of colonic adenocarcinoma treated with neoadjuvant chemoradiation followed by resection and colostomy. 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 He was transferred to the emergency department today from [facility] for progressive left flank pain, decreased urine output, nausea and  vomiting. 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 There is evidence of forniceal rupture on the left with marked perinephric fluid. His baseline creatinine is 0.8 has markedly increased 2 7. He is in acute renal failure and requires emergent bilateral percutaneous nephrostomy tube placement for renal decompression and salvage of renal function.  EXAM: PERC NEPHROSTOMY*L*; RIGHT NEPHROSTOGRAM; PERC NEPHROSTOMY*R*; IR ULTRASOUND GUIDANCE; LEFT NEPHROSTOGRAM  Date: 04/28/2014  PROCEDURE: 1. Ultrasound-guided puncture of the left renal collecting system 2. Left antegrade percutaneous nephrostogram 3. Placement of a 10 French percutaneous nephrostomy tube under fluoroscopic guidance 4. Ultrasound guided puncture of the right renal collecting system 5. Right antegrade percutaneous nephrostogram 6. Placement of a 10 French percutaneous nephrostomy tube under fluoroscopic guidance Interventional Radiologist:  Criselda Peaches, MD  ANESTHESIA/SEDATION: Moderate (conscious) sedation was used. 2.5 mg Versed, 100 mcg Fentanyl were administered intravenously. The patient's vital signs were monitored continuously by radiology nursing throughout the procedure.  Sedation Time: 30 minutes  2g Ancef were administered intravenously within 1 hr of skin incision.  FLUOROSCOPY TIME:  4 minutes 12 seconds  CONTRAST:  70mL OMNIPAQUE IOHEXOL 300 MG/ML  SOLN  TECHNIQUE: Informed consent was obtained from the patient following explanation of the procedure, risks, benefits and alternatives. The patient understands, agrees and consents for the procedure. All questions were addressed. A time out was performed.  Maximal barrier sterile technique utilized including caps, mask, sterile gowns, sterile gloves, large sterile drape, hand hygiene, and Betadine skin prep.  Attention was first turned to the left flank. The left flank was interrogated with ultrasound. The kidney is moderately  hydronephrotic. There is marked perinephric fluid and stranding consistent with the findings on the recent CT scan. A suitable access site on the skin overlying the lower  pole, posterior calix was identified. After local anesthesia was achieved, a small skin nick was made with an 11 blade scalpel. A 21 gauge Accustick needle was then advanced under direct sonographic guidance into the lower pole of the left kidney. A 0.018 inch wire was advanced under fluoroscopic guidance into the left renal collecting system. The Accustick sheath was then advanced over the wire and a 0.018 system exchanged for a 0.035 system. Gentle hand injection of contrast material confirms placement of the sheath within the renal collecting system. An antegrade nephrostogram was performed confirming moderate hydroureteronephrosis. There is complete obstruction of the distal ureter at the level of the mid sacral ala. The tract from the scan into the renal collecting system was then dilated serially to 10-French. A 10-French Cook all-purpose drain was then placed and positioned under fluoroscopic guidance. The locking loop is well formed within the left renal pelvis. The catheter was secured to the skin with 2-0 Prolene and a sterile bandage was placed. Catheter was left to gravity bag drainage.  Attention was next turned to the right flank. The right flank was interrogated with ultrasound. The right kidney is markedly hydronephrotic. No significant perinephric fluid. A suitable access site on the skin overlying the lower pole, posterior calix was identified. After local anesthesia was achieved, a small skin nick was made with an 11 blade scalpel. A 21 gauge Accustick needle was then advanced under direct sonographic guidance into the lower pole of the right kidney. A 0.018 inch wire was advanced under fluoroscopic guidance into the left renal collecting system. The Accustick sheath was then advanced over the wire and a 0.018 system exchanged  for a 0.035 system. Gentle hand injection of contrast material confirms placement of the sheath within the renal collecting system. An antegrade nephrostogram was performed confirming severe hydroureteronephrosis. There is complete obstruction of the distal ureter at the level of the mid sacral ala. The tract from the scan into the renal collecting system was then dilated serially to 10-French. A 10-French Cook all-purpose drain was then placed and positioned under fluoroscopic guidance. The locking loop is well formed within the left renal pelvis. The catheter was secured to the skin with 2-0 Prolene and a sterile bandage was placed. Catheter was left to gravity bag drainage.  COMPLICATIONS: None immediate.  IMPRESSION: 1. Moderate left hydronephrosis with evidence of forniceal rupture resulting in moderate perinephric fluid. 2. Severe right hydroureteronephrosis. 3. Successful placement of bilateral 10 French percutaneous nephrostomy tubes. 4. Bilateral percutaneous nephrostograms demonstrate complete obstruction of both ureters at the level of the mid sacrum consistent with malignant obstructive uropathy.  PLAN: Maintained tubes to gravity drainage and follow creatinine. Hematuria is expected to clear over 24-48 hr. Given presumed local recurrence of colon carcinoma in the setting of active lung adenocarcinoma, these tubes will likely be present for the remainder of the patient's life. Once they have healed (4-6 weeks), an effort can be made to cross the obstructions for conversion to percutaneous nephroureteral stents, or double-J ureteral stents if internalization is desired.  Signed,  Criselda Peaches, MD  [facility]  [facility]   Electronically Signed   By: Jacqulynn Cadet M.D.   On: 04/28/2014 08:01   Ir Perc Nephrostomy Right  04/28/2014   CLINICAL DATA:  73 year old male with a past history of colonic adenocarcinoma treated with neoadjuvant  chemoradiation followed by resection and colostomy. 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 He was transferred to the emergency department today from [facility] for progressive left flank pain, decreased urine output, nausea and vomiting. 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 There is evidence of forniceal rupture on the left with marked perinephric fluid. His baseline creatinine is 0.8 has markedly increased 2 7. He is in acute renal failure and requires emergent bilateral percutaneous nephrostomy tube placement for renal decompression and salvage of renal function.  EXAM: PERC NEPHROSTOMY*L*; RIGHT NEPHROSTOGRAM; PERC NEPHROSTOMY*R*; IR ULTRASOUND GUIDANCE; LEFT NEPHROSTOGRAM  Date: 04/28/2014  PROCEDURE: 1. Ultrasound-guided puncture of the left renal collecting system 2. Left antegrade percutaneous nephrostogram 3. Placement of a 10 French percutaneous nephrostomy tube under fluoroscopic guidance 4. Ultrasound guided puncture of the right renal collecting system 5. Right antegrade percutaneous nephrostogram 6. Placement of a 10 French percutaneous nephrostomy tube under fluoroscopic guidance Interventional Radiologist:  Criselda Peaches, MD  ANESTHESIA/SEDATION: Moderate (conscious) sedation was used. 2.5 mg Versed, 100 mcg Fentanyl were administered intravenously. The patient's vital signs were monitored continuously by radiology nursing throughout the procedure.  Sedation Time: 30 minutes  2g Ancef were administered intravenously within 1 hr of skin incision.  FLUOROSCOPY TIME:  4 minutes 12 seconds  CONTRAST:  14mL OMNIPAQUE IOHEXOL 300 MG/ML  SOLN  TECHNIQUE: Informed consent was obtained from the patient following explanation of the procedure, risks, benefits and alternatives. The patient understands, agrees and consents for the procedure. All questions were  addressed. A time out was performed.  Maximal barrier sterile technique utilized including caps, mask, sterile gowns, sterile gloves, large sterile drape, hand hygiene, and Betadine skin prep.  Attention was first turned to the left flank. The left flank was interrogated with ultrasound. The kidney is moderately hydronephrotic. There is marked perinephric fluid and stranding consistent with the findings on the recent CT scan. A suitable access site on the skin overlying the lower pole, posterior calix was identified. After local anesthesia was achieved, a small skin nick was made with an 11 blade scalpel. A 21 gauge Accustick needle was then advanced under direct sonographic guidance into the lower pole of the left kidney. A 0.018 inch wire was advanced under fluoroscopic guidance into the left renal collecting system. The Accustick sheath was then advanced over the wire and a 0.018 system exchanged for a 0.035 system. Gentle hand injection of contrast material confirms placement of the sheath within the renal collecting system. An antegrade nephrostogram was performed confirming moderate hydroureteronephrosis. There is complete obstruction of the distal ureter at the level of the mid sacral ala. The tract from the scan into the renal collecting system was then dilated serially to 10-French. A 10-French Cook all-purpose drain was then placed and positioned under fluoroscopic guidance. The locking loop is well formed within the left renal pelvis. The catheter was secured to the skin with 2-0 Prolene and a sterile bandage was placed. Catheter was left to gravity bag drainage.  Attention was next turned to the right flank. The right flank was interrogated with ultrasound. The right kidney is markedly hydronephrotic. No significant perinephric fluid. A suitable access site on the skin overlying the lower pole, posterior calix was identified. After local anesthesia was achieved, a small skin nick was made with an 11  blade scalpel. A 21 gauge Accustick needle was then advanced under direct sonographic guidance into the lower pole of the right kidney. A 0.018 inch wire was advanced under fluoroscopic guidance into the left renal collecting system. The Accustick  sheath was then advanced over the wire and a 0.018 system exchanged for a 0.035 system. Gentle hand injection of contrast material confirms placement of the sheath within the renal collecting system. An antegrade nephrostogram was performed confirming severe hydroureteronephrosis. There is complete obstruction of the distal ureter at the level of the mid sacral ala. The tract from the scan into the renal collecting system was then dilated serially to 10-French. A 10-French Cook all-purpose drain was then placed and positioned under fluoroscopic guidance. The locking loop is well formed within the left renal pelvis. The catheter was secured to the skin with 2-0 Prolene and a sterile bandage was placed. Catheter was left to gravity bag drainage.  COMPLICATIONS: None immediate.  IMPRESSION: 1. Moderate left hydronephrosis with evidence of forniceal rupture resulting in moderate perinephric fluid. 2. Severe right hydroureteronephrosis. 3. Successful placement of bilateral 10 French percutaneous nephrostomy tubes. 4. Bilateral percutaneous nephrostograms demonstrate complete obstruction of both ureters at the level of the mid sacrum consistent with malignant obstructive uropathy.  PLAN: Maintained tubes to gravity drainage and follow creatinine. Hematuria is expected to clear over 24-48 hr. Given presumed local recurrence of colon carcinoma in the setting of active lung adenocarcinoma, these tubes will likely be present for the remainder of the patient's life. Once they have healed (4-6 weeks), an effort can be made to cross the obstructions for conversion to percutaneous nephroureteral stents, or double-J ureteral stents if internalization is desired.  Signed,  Criselda Peaches, MD  [facility]  [facility]   Electronically Signed   By: Jacqulynn Cadet M.D.   On: 04/28/2014 08:01   Ir US Guide Bx Asp/drain  04/28/2014   CLINICAL DATA:  73 year old male with a past history of colonic adenocarcinoma treated with neoadjuvant chemoradiation followed by resection and colostomy. 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 He was transferred to the emergency department today from Radiation Oncology for progressive left flank pain, decreased urine output, nausea and vomiting. 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 There is evidence of forniceal rupture on the left with marked perinephric fluid. His baseline creatinine is 0.8 has markedly increased 2 7. He is in acute renal failure and requires emergent bilateral percutaneous nephrostomy tube placement for renal decompression and salvage of renal function.  EXAM: PERC NEPHROSTOMY*L*; RIGHT NEPHROSTOGRAM; PERC NEPHROSTOMY*R*; IR ULTRASOUND GUIDANCE; LEFT NEPHROSTOGRAM  Date: 04/28/2014  PROCEDURE: 1. Ultrasound-guided puncture of the left renal collecting system 2. Left antegrade percutaneous nephrostogram 3. Placement of a 10 French percutaneous nephrostomy tube under fluoroscopic guidance 4. Ultrasound guided puncture of the right renal collecting system 5. Right antegrade percutaneous nephrostogram 6. Placement of a 10 French percutaneous nephrostomy tube under fluoroscopic guidance Interventional Radiologist:  Criselda Peaches, MD  ANESTHESIA/SEDATION: Moderate (conscious) sedation was used. 2.5 mg Versed, 100 mcg Fentanyl were administered intravenously. The patient's vital signs were monitored continuously by radiology nursing throughout the procedure.  Sedation Time: 30 minutes  2g Ancef were administered intravenously within 1 hr of skin incision.   FLUOROSCOPY TIME:  4 minutes 12 seconds  CONTRAST:  69mL OMNIPAQUE IOHEXOL 300 MG/ML  SOLN  TECHNIQUE: Informed consent was obtained from the patient following explanation of the procedure, risks, benefits and alternatives. The patient understands, agrees and consents for the procedure. All questions were addressed. A time out was performed.  Maximal barrier sterile technique utilized including caps, mask, sterile gowns, sterile  gloves, large sterile drape, hand hygiene, and Betadine skin prep.  Attention was first turned to the left flank. The left flank was interrogated with ultrasound. The kidney is moderately hydronephrotic. There is marked perinephric fluid and stranding consistent with the findings on the recent CT scan. A suitable access site on the skin overlying the lower pole, posterior calix was identified. After local anesthesia was achieved, a small skin nick was made with an 11 blade scalpel. A 21 gauge Accustick needle was then advanced under direct sonographic guidance into the lower pole of the left kidney. A 0.018 inch wire was advanced under fluoroscopic guidance into the left renal collecting system. The Accustick sheath was then advanced over the wire and a 0.018 system exchanged for a 0.035 system. Gentle hand injection of contrast material confirms placement of the sheath within the renal collecting system. An antegrade nephrostogram was performed confirming moderate hydroureteronephrosis. There is complete obstruction of the distal ureter at the level of the mid sacral ala. The tract from the scan into the renal collecting system was then dilated serially to 10-French. A 10-French Cook all-purpose drain was then placed and positioned under fluoroscopic guidance. The locking loop is well formed within the left renal pelvis. The catheter was secured to the skin with 2-0 Prolene and a sterile bandage was placed. Catheter was left to gravity bag drainage.  Attention was next turned to the right  flank. The right flank was interrogated with ultrasound. The right kidney is markedly hydronephrotic. No significant perinephric fluid. A suitable access site on the skin overlying the lower pole, posterior calix was identified. After local anesthesia was achieved, a small skin nick was made with an 11 blade scalpel. A 21 gauge Accustick needle was then advanced under direct sonographic guidance into the lower pole of the right kidney. A 0.018 inch wire was advanced under fluoroscopic guidance into the left renal collecting system. The Accustick sheath was then advanced over the wire and a 0.018 system exchanged for a 0.035 system. Gentle hand injection of contrast material confirms placement of the sheath within the renal collecting system. An antegrade nephrostogram was performed confirming severe hydroureteronephrosis. There is complete obstruction of the distal ureter at the level of the mid sacral ala. The tract from the scan into the renal collecting system was then dilated serially to 10-French. A 10-French Cook all-purpose drain was then placed and positioned under fluoroscopic guidance. The locking loop is well formed within the left renal pelvis. The catheter was secured to the skin with 2-0 Prolene and a sterile bandage was placed. Catheter was left to gravity bag drainage.  COMPLICATIONS: None immediate.  IMPRESSION: 1. Moderate left hydronephrosis with evidence of forniceal rupture resulting in moderate perinephric fluid. 2. Severe right hydroureteronephrosis. 3. Successful placement of bilateral 10 French percutaneous nephrostomy tubes. 4. Bilateral percutaneous nephrostograms demonstrate complete obstruction of both ureters at the level of the mid sacrum consistent with malignant obstructive uropathy.  PLAN: Maintained tubes to gravity drainage and follow creatinine. Hematuria is expected to clear over 24-48 hr. Given presumed local recurrence of colon carcinoma in the setting of active lung  adenocarcinoma, these tubes will likely be present for the remainder of the patient's life. Once they have healed (4-6 weeks), an effort can be made to cross the obstructions for conversion to percutaneous nephroureteral stents, or double-J ureteral stents if internalization is desired.  Signed,  Criselda Peaches, MD  [facility]  [facility]   Electronically Signed  By: Jacqulynn Cadet M.D.   On: 04/28/2014 08:01   Ir US Guide Bx Asp/drain  04/28/2014   CLINICAL DATA:  73 year old male with a past history of colonic adenocarcinoma treated with neoadjuvant chemoradiation followed by resection and colostomy. 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 He was transferred to the emergency department today from Radiation [facility]cology for progressive left flank pain, decreased urine output, nausea and vomiting. 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 There is evidence of forniceal rupture on the left with marked perinephric fluid. His baseline creatinine is 0.8 has markedly increased 2 7. He is in acute renal failure and requires emergent bilateral percutaneous nephrostomy tube placement for renal decompression and salvage of renal function.  EXAM: PERC NEPHROSTOMY*L*; RIGHT NEPHROSTOGRAM; PERC NEPHROSTOMY*R*; IR ULTRASOUND GUIDANCE; LEFT NEPHROSTOGRAM  Date: 04/28/2014  PROCEDURE: 1. Ultrasound-guided puncture of the left renal collecting system 2. Left antegrade percutaneous nephrostogram 3. Placement of a 10 French percutaneous nephrostomy tube under fluoroscopic guidance 4. Ultrasound guided puncture of the right renal collecting system 5. Right antegrade percutaneous nephrostogram 6. Placement of a 10 French percutaneous nephrostomy tube under fluoroscopic guidance Interventional Radiologist:  Criselda Peaches, MD  ANESTHESIA/SEDATION:  Moderate (conscious) sedation was used. 2.5 mg Versed, 100 mcg Fentanyl were administered intravenously. The patient's vital signs were monitored continuously by radiology nursing throughout the procedure.  Sedation Time: 30 minutes  2g Ancef were administered intravenously within 1 hr of skin incision.  FLUOROSCOPY TIME:  4 minutes 12 seconds  CONTRAST:  107mL OMNIPAQUE IOHEXOL 300 MG/ML  SOLN  TECHNIQUE: Informed consent was obtained from the patient following explanation of the procedure, risks, benefits and alternatives. The patient understands, agrees and consents for the procedure. All questions were addressed. A time out was performed.  Maximal barrier sterile technique utilized including caps, mask, sterile gowns, sterile gloves, large sterile drape, hand hygiene, and Betadine skin prep.  Attention was first turned to the left flank. The left flank was interrogated with ultrasound. The kidney is moderately hydronephrotic. There is marked perinephric fluid and stranding consistent with the findings on the recent CT scan. A suitable access site on the skin overlying the lower pole, posterior calix was identified. After local anesthesia was achieved, a small skin nick was made with an 11 blade scalpel. A 21 gauge Accustick needle was then advanced under direct sonographic guidance into the lower pole of the left kidney. A 0.018 inch wire was advanced under fluoroscopic guidance into the left renal collecting system. The Accustick sheath was then advanced over the wire and a 0.018 system exchanged for a 0.035 system. Gentle hand injection of contrast material confirms placement of the sheath within the renal collecting system. An antegrade nephrostogram was performed confirming moderate hydroureteronephrosis. There is complete obstruction of the distal ureter at the level of the mid sacral ala. The tract from the scan into the renal collecting system was then dilated serially to 10-French. A 10-French Cook  all-purpose drain was then placed and positioned under fluoroscopic guidance. The locking loop is well formed within the left renal pelvis. The catheter was secured to the skin with 2-0 Prolene and a sterile bandage was placed. Catheter was left to gravity bag drainage.  Attention was next turned to the right flank. The right flank was interrogated with ultrasound. The right kidney is markedly hydronephrotic. No significant perinephric fluid. A suitable access site on the skin overlying the lower pole,  posterior calix was identified. After local anesthesia was achieved, a small skin nick was made with an 11 blade scalpel. A 21 gauge Accustick needle was then advanced under direct sonographic guidance into the lower pole of the right kidney. A 0.018 inch wire was advanced under fluoroscopic guidance into the left renal collecting system. The Accustick sheath was then advanced over the wire and a 0.018 system exchanged for a 0.035 system. Gentle hand injection of contrast material confirms placement of the sheath within the renal collecting system. An antegrade nephrostogram was performed confirming severe hydroureteronephrosis. There is complete obstruction of the distal ureter at the level of the mid sacral ala. The tract from the scan into the renal collecting system was then dilated serially to 10-French. A 10-French Cook all-purpose drain was then placed and positioned under fluoroscopic guidance. The locking loop is well formed within the left renal pelvis. The catheter was secured to the skin with 2-0 Prolene and a sterile bandage was placed. Catheter was left to gravity bag drainage.  COMPLICATIONS: None immediate.  IMPRESSION: 1. Moderate left hydronephrosis with evidence of forniceal rupture resulting in moderate perinephric fluid. 2. Severe right hydroureteronephrosis. 3. Successful placement of bilateral 10 French percutaneous nephrostomy tubes. 4. Bilateral percutaneous nephrostograms demonstrate complete  obstruction of both ureters at the level of the mid sacrum consistent with malignant obstructive uropathy.  PLAN: Maintained tubes to gravity drainage and follow creatinine. Hematuria is expected to clear over 24-48 hr. Given presumed local recurrence of colon carcinoma in the setting of active lung adenocarcinoma, these tubes will likely be present for the remainder of the patient's life. Once they have healed (4-6 weeks), an effort can be made to cross the obstructions for conversion to percutaneous nephroureteral stents, or double-J ureteral stents if internalization is desired.  Signed,  Criselda Peaches, MD  [facility]  [facility]   Electronically Signed   By: Jacqulynn Cadet M.D.   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 S/p (B) PCN 5/22 Excellent drain function and UOP Cr declining IR will cont to follow    LOS: 1 day    Ascencion Dike PA-C 04/28/2014 9:11 AM

## 2014-04-29 DIAGNOSIS — D649 Anemia, unspecified: Secondary | ICD-10-CM

## 2014-04-29 LAB — BASIC METABOLIC PANEL
BUN: 12 mg/dL (ref 6–23)
CO2: 22 mEq/L (ref 19–32)
Calcium: 7.7 mg/dL — ABNORMAL LOW (ref 8.4–10.5)
Chloride: 105 mEq/L (ref 96–112)
Creatinine, Ser: 1.16 mg/dL (ref 0.50–1.35)
GFR, EST AFRICAN AMERICAN: 71 mL/min — AB (ref >90)
GFR, EST NON AFRICAN AMERICAN: 61 mL/min — AB (ref >90)
GLUCOSE: 92 mg/dL (ref 70–99)
POTASSIUM: 3.3 meq/L — AB (ref 3.7–5.3)
SODIUM: 139 meq/L (ref 137–147)

## 2014-04-29 LAB — CBC
HCT: 22.2 % — ABNORMAL LOW (ref 39.0–52.0)
HEMOGLOBIN: 7.6 g/dL — AB (ref 13.0–17.0)
MCH: 26.7 pg (ref 26.0–34.0)
MCHC: 34.2 g/dL (ref 30.0–36.0)
MCV: 77.9 fL — ABNORMAL LOW (ref 78.0–100.0)
Platelets: 429 10*3/uL — ABNORMAL HIGH (ref 150–400)
RBC: 2.85 MIL/uL — AB (ref 4.22–5.81)
RDW: 20 % — ABNORMAL HIGH (ref 11.5–15.5)
WBC: 10.5 10*3/uL (ref 4.0–10.5)

## 2014-04-29 MED ORDER — MAGNESIUM SULFATE 4000MG/100ML IJ SOLN
4.0000 g | Freq: Once | INTRAMUSCULAR | Status: AC
  Administered 2014-04-29: 4 g via INTRAVENOUS
  Filled 2014-04-29: qty 100

## 2014-04-29 MED ORDER — POTASSIUM CHLORIDE CRYS ER 20 MEQ PO TBCR
40.0000 meq | EXTENDED_RELEASE_TABLET | Freq: Once | ORAL | Status: AC
  Administered 2014-04-29: 40 meq via ORAL
  Filled 2014-04-29: qty 2

## 2014-04-29 NOTE — Progress Notes (Signed)
Subjective: Pt feels ok. Offers no new c/o Denies much pain at PCN sites  Objective: Physical Exam: BP 156/73  Pulse 79  Temp(Src) 98.2 F (36.8 C) (Oral)  Resp 16  Ht 5' 9.5" (1.765 m)  Wt 116 lb 8 oz (52.844 kg)  BMI 16.96 kg/m2  SpO2 94% Alert, no distress Abd, soft, NT (B)PCNs intact, excellent clear yellow urine output from both. Cr down to 1.1   Labs: CBC  Recent Labs  04/28/14 2015 04/29/14 0650  WBC 10.0 10.5  HGB 7.2* 7.6*  HCT 21.5* 22.2*  PLT 365 429*   BMET  Recent Labs  04/28/14 2015 04/29/14 0650  NA 139 139  K 3.4* 3.3*  CL 105 105  CO2 21 22  GLUCOSE 155* 92  BUN 20 12  CREATININE 1.81* 1.16  CALCIUM 7.6* 7.7*   LFT No results found for this basename: PROT, ALBUMIN, AST, ALT, ALKPHOS, BILITOT, BILIDIR, IBILI, LIPASE,  in the last 72 hours PT/INR  Recent Labs  04/27/14 1710  LABPROT 14.2  INR 1.12     Studies/Results: Mr Pelvis Wo Contrast  04/28/2014   CLINICAL DATA:  Enlarging pelvic mass causing hydronephrosis on recent CT. Remote history of colon cancer. Currently being treated for lung cancer. 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 Recurrent rectal cancer unlikely 20 years after his treatment, but in the differential diagnosis.  EXAM: MRI PELVIS WITHOUT CONTRAST  TECHNIQUE: Multiplanar multisequence MR imaging of the pelvis was performed. No intravenous contrast was administered.  COMPARISON:  Abdominal pelvic CT 04/27/2014.  PET-CT 12/21/2013.  FINDINGS: Examination is limited by motion and lack of contrast.  As seen on recent CT, there is a large complex mass or fluid collection in the presacral space, extending off midline to the right through the sciatic foramen. This measures approximately 5.3 x 8.7 cm transverse and 6.7 cm cephalocaudad. This process of buttocks and erodes the anterior cortex of the sacrum. There is diffuse replacement of the  normal marrow signal throughout the right aspect of the sacrum. The sacroiliac joints appear normal. The remainder of the bony pelvis appears normal.  As correlated with the CT, this presacral process is intimately associated with and indistinguishable from the rectum. Patient is status post descending colostomy. Ureterectasis appears improved following recent bilateral percutaneous nephrostomy. The urinary bladder is mildly distended. There is generalized soft tissue edema throughout the pelvis and lower abdomen. Small bilateral hip joint effusions are noted.  IMPRESSION: 1. Again demonstrated is a large necrotic mass or complex fluid collection in the presacral space, extending through the sciatic notch. Since this has been present for at least 4 months, necrotic neoplasm is favored (potentially superinfected). 2. Adjacent cortical destruction and abnormal signal within the right aspect of the sacrum suspicious for either direct extension of tumor or osteomyelitis. 3. Improved bilateral ureterectasis status post bilateral ureteral stent placement. 4. The presacral process is amenable to percutaneous transgluteal drainage/biopsy.   Electronically Signed   By: Camie Patience M.D.   On: 04/28/2014 11:10   Ct Abdomen Pelvis W Contrast  04/27/2014   CLINICAL DATA:  Left flank pain.  History of colon cancer.  EXAM: CT ABDOMEN AND PELVIS WITH CONTRAST  TECHNIQUE: Multidetector CT imaging of the abdomen and pelvis was performed using the standard protocol following bolus administration of intravenous contrast.  CONTRAST:  173mL OMNIPAQUE IOHEXOL 300 MG/ML  SOLN  COMPARISON:  CT abdomen pelvis 03/21/2014.  FINDINGS: Visualization  of the lower thorax demonstrates new small left pleural effusion. Normal heart size.  Unchanged multiple sub cm low-attenuation lesions within the liver. Focal fatty deposition adjacent to the falciform ligament. Hepatic and portal veins are patent. Gallbladder is unremarkable. Spleen, pancreas  and bilateral adrenal glands are unremarkable.  Patient status post low anterior resection. 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 Adjacent to the resection site within the low anatomic pelvis is a complex soft tissue, gas and fluid containing mass located anterior to and adjacent to the sacrum measuring 8.2 x 5.7 cm, slightly increased when compared to prior examination. Interval increase in now moderate amount of ascites.  There is new severe bilateral hydroureteronephrosis with bilateral delayed nephrograms, worse within the right kidney with extensive left-greater-than-right perinephric fluid and fat stranding. Delayed phase images demonstrate excretion of contrast within the left renal collecting system. No excretion of contrast is demonstrated within the right renal collecting system. This new marked hydroureteronephrosis likely secondary to the worsening above described pelvic soft tissue, fluid and gas collection. The urinary bladder is distended.  Re- demonstrated nonspecific 1.1 cm enhancing mass along the posterior left lateral aspect of the urinary bladder. Central dystrophic calcifications within the prostate. Oral contrast material is demonstrated throughout the small bowel. No definite evidence for bowel obstruction.  No definite aggressive or acute appearing osseous lesions. Mild mottled appearance of the sacrum. Bilateral L5 pars defects.  IMPRESSION: 1. Interval worsening of large irregular soft tissue mass fluid/gas collection within the presacral space and involving parasacral musculature. Findings may be secondary to an inflammatory/infectious mass however given the history of malignancy, local recurrence is a consideration. 2. There is now involvement of the distal aspect of the bilateral ureters by the large soft tissue mass within the presacral region causing severe bilateral hydronephrosis, right-greater-than-left with  extensive perinephric fat stranding as well as delayed nephrograms. No excretion from the right kidney with minimal excretion from the left kidney compatible with high-grade obstruction. 3. 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 4. Interval increase in moderate ascites and anasarca. 5. New small left pleural effusion. 6. Nonspecific enhancing mass along the posterior lateral aspect of the bladder wall potentially secondary to infectious or inflammatory process, ureterocele or metastatic lesion. 7. Mild mottled appearance of the sacrum. Osteomyelitis not excluded. These results were called by telephone at the time of interpretation on 04/27/2014 at 3:33 PM to Dr. Tyler Pita , who verbally acknowledged these results.   Electronically Signed   By: Lovey Newcomer M.D.   On: 04/27/2014 15:53   Ir Nephrostogram Left  04/28/2014   CLINICAL DATA:  73 year old male with a past history of colonic adenocarcinoma treated with neoadjuvant chemoradiation followed by resection and colostomy. 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 He was transferred to the emergency department today from [facility] for progressive left flank pain, decreased urine output, nausea and vomiting. 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 There is evidence of forniceal rupture on the left with marked perinephric fluid. His baseline creatinine is 0.8 has markedly increased 2 7. He is in acute renal failure and requires emergent bilateral percutaneous nephrostomy tube placement for renal decompression and salvage of renal function.  EXAM: PERC NEPHROSTOMY*L*; RIGHT NEPHROSTOGRAM; PERC NEPHROSTOMY*R*; IR ULTRASOUND GUIDANCE; LEFT NEPHROSTOGRAM  Date: 04/28/2014  PROCEDURE: 1. Ultrasound-guided puncture of the left renal collecting system 2. Left antegrade percutaneous nephrostogram  3.  Placement of a 10 French percutaneous nephrostomy tube under fluoroscopic guidance 4. Ultrasound guided puncture of the right renal collecting system 5. Right antegrade percutaneous nephrostogram 6. Placement of a 10 French percutaneous nephrostomy tube under fluoroscopic guidance Interventional Radiologist:  Criselda Peaches, MD  ANESTHESIA/SEDATION: Moderate (conscious) sedation was used. 2.5 mg Versed, 100 mcg Fentanyl were administered intravenously. The patient's vital signs were monitored continuously by radiology nursing throughout the procedure.  Sedation Time: 30 minutes  2g Ancef were administered intravenously within 1 hr of skin incision.  FLUOROSCOPY TIME:  4 minutes 12 seconds  CONTRAST:  95mL OMNIPAQUE IOHEXOL 300 MG/ML  SOLN  TECHNIQUE: Informed consent was obtained from the patient following explanation of the procedure, risks, benefits and alternatives. The patient understands, agrees and consents for the procedure. All questions were addressed. A time out was performed.  Maximal barrier sterile technique utilized including caps, mask, sterile gowns, sterile gloves, large sterile drape, hand hygiene, and Betadine skin prep.  Attention was first turned to the left flank. The left flank was interrogated with ultrasound. The kidney is moderately hydronephrotic. There is marked perinephric fluid and stranding consistent with the findings on the recent CT scan. A suitable access site on the skin overlying the lower pole, posterior calix was identified. After local anesthesia was achieved, a small skin nick was made with an 11 blade scalpel. A 21 gauge Accustick needle was then advanced under direct sonographic guidance into the lower pole of the left kidney. A 0.018 inch wire was advanced under fluoroscopic guidance into the left renal collecting system. The Accustick sheath was then advanced over the wire and a 0.018 system exchanged for a 0.035 system. Gentle hand injection of contrast material  confirms placement of the sheath within the renal collecting system. An antegrade nephrostogram was performed confirming moderate hydroureteronephrosis. There is complete obstruction of the distal ureter at the level of the mid sacral ala. The tract from the scan into the renal collecting system was then dilated serially to 10-French. A 10-French Cook all-purpose drain was then placed and positioned under fluoroscopic guidance. The locking loop is well formed within the left renal pelvis. The catheter was secured to the skin with 2-0 Prolene and a sterile bandage was placed. Catheter was left to gravity bag drainage.  Attention was next turned to the right flank. The right flank was interrogated with ultrasound. The right kidney is markedly hydronephrotic. No significant perinephric fluid. A suitable access site on the skin overlying the lower pole, posterior calix was identified. After local anesthesia was achieved, a small skin nick was made with an 11 blade scalpel. A 21 gauge Accustick needle was then advanced under direct sonographic guidance into the lower pole of the right kidney. A 0.018 inch wire was advanced under fluoroscopic guidance into the left renal collecting system. The Accustick sheath was then advanced over the wire and a 0.018 system exchanged for a 0.035 system. Gentle hand injection of contrast material confirms placement of the sheath within the renal collecting system. An antegrade nephrostogram was performed confirming severe hydroureteronephrosis. There is complete obstruction of the distal ureter at the level of the mid sacral ala. The tract from the scan into the renal collecting system was then dilated serially to 10-French. A 10-French Cook all-purpose drain was then placed and positioned under fluoroscopic guidance. The locking loop is well formed within the left renal pelvis. The catheter was secured to the skin with 2-0 Prolene and a sterile bandage was placed.  Catheter was left to  gravity bag drainage.  COMPLICATIONS: None immediate.  IMPRESSION: 1. Moderate left hydronephrosis with evidence of forniceal rupture resulting in moderate perinephric fluid. 2. Severe right hydroureteronephrosis. 3. Successful placement of bilateral 10 French percutaneous nephrostomy tubes. 4. Bilateral percutaneous nephrostograms demonstrate complete obstruction of both ureters at the level of the mid sacrum consistent with malignant obstructive uropathy.  PLAN: Maintained tubes to gravity drainage and follow creatinine. Hematuria is expected to clear over 24-48 hr. Given presumed local recurrence of colon carcinoma in the setting of active lung adenocarcinoma, these tubes will likely be present for the remainder of the patient's life. Once they have healed (4-6 weeks), an effort can be made to cross the obstructions for conversion to percutaneous nephroureteral stents, or double-J ureteral stents if internalization is desired.  Signed,  Criselda Peaches, MD  [facility]  [facility]   Electronically Signed   By: Jacqulynn Cadet M.D.   On: 04/28/2014 08:01   Ir Nephrostogram Right  04/28/2014   CLINICAL DATA:  73 year old male with a past history of colonic adenocarcinoma treated with neoadjuvant chemoradiation followed by resection and colostomy. 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 He was transferred to the emergency department today from [facility] for progressive left flank pain, decreased urine output, nausea and vomiting. 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 There is evidence of forniceal rupture on the left with marked perinephric fluid. His baseline creatinine is 0.8 has markedly increased 2 7. He is in acute renal failure and requires emergent bilateral percutaneous nephrostomy tube placement for renal  decompression and salvage of renal function.  EXAM: PERC NEPHROSTOMY*L*; RIGHT NEPHROSTOGRAM; PERC NEPHROSTOMY*R*; IR ULTRASOUND GUIDANCE; LEFT NEPHROSTOGRAM  Date: 04/28/2014  PROCEDURE: 1. Ultrasound-guided puncture of the left renal collecting system 2. Left antegrade percutaneous nephrostogram 3. Placement of a 10 French percutaneous nephrostomy tube under fluoroscopic guidance 4. Ultrasound guided puncture of the right renal collecting system 5. Right antegrade percutaneous nephrostogram 6. Placement of a 10 French percutaneous nephrostomy tube under fluoroscopic guidance Interventional Radiologist:  Criselda Peaches, MD  ANESTHESIA/SEDATION: Moderate (conscious) sedation was used. 2.5 mg Versed, 100 mcg Fentanyl were administered intravenously. The patient's vital signs were monitored continuously by radiology nursing throughout the procedure.  Sedation Time: 30 minutes  2g Ancef were administered intravenously within 1 hr of skin incision.  FLUOROSCOPY TIME:  4 minutes 12 seconds  CONTRAST:  83mL OMNIPAQUE IOHEXOL 300 MG/ML  SOLN  TECHNIQUE: Informed consent was obtained from the patient following explanation of the procedure, risks, benefits and alternatives. The patient understands, agrees and consents for the procedure. All questions were addressed. A time out was performed.  Maximal barrier sterile technique utilized including caps, mask, sterile gowns, sterile gloves, large sterile drape, hand hygiene, and Betadine skin prep.  Attention was first turned to the left flank. The left flank was interrogated with ultrasound. The kidney is moderately hydronephrotic. There is marked perinephric fluid and stranding consistent with the findings on the recent CT scan. A suitable access site on the skin overlying the lower pole, posterior calix was identified. After local anesthesia was achieved, a small skin nick was made with an 11 blade scalpel. A 21 gauge Accustick needle was then advanced under direct  sonographic guidance into the lower pole of the left kidney. A 0.018 inch wire was advanced under fluoroscopic guidance into the left renal collecting system.  The Accustick sheath was then advanced over the wire and a 0.018 system exchanged for a 0.035 system. Gentle hand injection of contrast material confirms placement of the sheath within the renal collecting system. An antegrade nephrostogram was performed confirming moderate hydroureteronephrosis. There is complete obstruction of the distal ureter at the level of the mid sacral ala. The tract from the scan into the renal collecting system was then dilated serially to 10-French. A 10-French Cook all-purpose drain was then placed and positioned under fluoroscopic guidance. The locking loop is well formed within the left renal pelvis. The catheter was secured to the skin with 2-0 Prolene and a sterile bandage was placed. Catheter was left to gravity bag drainage.  Attention was next turned to the right flank. The right flank was interrogated with ultrasound. The right kidney is markedly hydronephrotic. No significant perinephric fluid. A suitable access site on the skin overlying the lower pole, posterior calix was identified. After local anesthesia was achieved, a small skin nick was made with an 11 blade scalpel. A 21 gauge Accustick needle was then advanced under direct sonographic guidance into the lower pole of the right kidney. A 0.018 inch wire was advanced under fluoroscopic guidance into the left renal collecting system. The Accustick sheath was then advanced over the wire and a 0.018 system exchanged for a 0.035 system. Gentle hand injection of contrast material confirms placement of the sheath within the renal collecting system. An antegrade nephrostogram was performed confirming severe hydroureteronephrosis. There is complete obstruction of the distal ureter at the level of the mid sacral ala. The tract from the scan into the renal collecting system  was then dilated serially to 10-French. A 10-French Cook all-purpose drain was then placed and positioned under fluoroscopic guidance. The locking loop is well formed within the left renal pelvis. The catheter was secured to the skin with 2-0 Prolene and a sterile bandage was placed. Catheter was left to gravity bag drainage.  COMPLICATIONS: None immediate.  IMPRESSION: 1. Moderate left hydronephrosis with evidence of forniceal rupture resulting in moderate perinephric fluid. 2. Severe right hydroureteronephrosis. 3. Successful placement of bilateral 10 French percutaneous nephrostomy tubes. 4. Bilateral percutaneous nephrostograms demonstrate complete obstruction of both ureters at the level of the mid sacrum consistent with malignant obstructive uropathy.  PLAN: Maintained tubes to gravity drainage and follow creatinine. Hematuria is expected to clear over 24-48 hr. Given presumed local recurrence of colon carcinoma in the setting of active lung adenocarcinoma, these tubes will likely be present for the remainder of the patient's life. Once they have healed (4-6 weeks), an effort can be made to cross the obstructions for conversion to percutaneous nephroureteral stents, or double-J ureteral stents if internalization is desired.  Signed,  Criselda Peaches, MD  [facility]  [facility]   Electronically Signed   By: Jacqulynn Cadet M.D.   On: 04/28/2014 08:01   Ir Perc Nephrostomy Left  04/28/2014   CLINICAL DATA:  73 year old male with a past history of colonic adenocarcinoma treated with neoadjuvant chemoradiation followed by resection and colostomy. 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 He was transferred to the emergency department today from [facility] for progressive left flank pain, decreased urine output, nausea and vomiting. Workup revealed enlargement of a presacral  mass resulting in severe bilateral hydronephrosis. There is evidence of forniceal rupture on the left with marked perinephric fluid. His baseline creatinine is 0.8 has markedly increased  2 7. He is in acute renal failure and requires emergent bilateral percutaneous nephrostomy tube placement for renal decompression and salvage of renal function.  EXAM: PERC NEPHROSTOMY*L*; RIGHT NEPHROSTOGRAM; PERC NEPHROSTOMY*R*; IR ULTRASOUND GUIDANCE; LEFT NEPHROSTOGRAM  Date: 04/28/2014  PROCEDURE: 1. Ultrasound-guided puncture of the left renal collecting system 2. Left antegrade percutaneous nephrostogram 3. Placement of a 10 French percutaneous nephrostomy tube under fluoroscopic guidance 4. Ultrasound guided puncture of the right renal collecting system 5. Right antegrade percutaneous nephrostogram 6. Placement of a 10 French percutaneous nephrostomy tube under fluoroscopic guidance Interventional Radiologist:  Criselda Peaches, MD  ANESTHESIA/SEDATION: Moderate (conscious) sedation was used. 2.5 mg Versed, 100 mcg Fentanyl were administered intravenously. The patient's vital signs were monitored continuously by radiology nursing throughout the procedure.  Sedation Time: 30 minutes  2g Ancef were administered intravenously within 1 hr of skin incision.  FLUOROSCOPY TIME:  4 minutes 12 seconds  CONTRAST:  64mL OMNIPAQUE IOHEXOL 300 MG/ML  SOLN  TECHNIQUE: Informed consent was obtained from the patient following explanation of the procedure, risks, benefits and alternatives. The patient understands, agrees and consents for the procedure. All questions were addressed. A time out was performed.  Maximal barrier sterile technique utilized including caps, mask, sterile gowns, sterile gloves, large sterile drape, hand hygiene, and Betadine skin prep.  Attention was first turned to the left flank. The left flank was interrogated with ultrasound. The kidney is moderately hydronephrotic. There is marked perinephric fluid and  stranding consistent with the findings on the recent CT scan. A suitable access site on the skin overlying the lower pole, posterior calix was identified. After local anesthesia was achieved, a small skin nick was made with an 11 blade scalpel. A 21 gauge Accustick needle was then advanced under direct sonographic guidance into the lower pole of the left kidney. A 0.018 inch wire was advanced under fluoroscopic guidance into the left renal collecting system. The Accustick sheath was then advanced over the wire and a 0.018 system exchanged for a 0.035 system. Gentle hand injection of contrast material confirms placement of the sheath within the renal collecting system. An antegrade nephrostogram was performed confirming moderate hydroureteronephrosis. There is complete obstruction of the distal ureter at the level of the mid sacral ala. The tract from the scan into the renal collecting system was then dilated serially to 10-French. A 10-French Cook all-purpose drain was then placed and positioned under fluoroscopic guidance. The locking loop is well formed within the left renal pelvis. The catheter was secured to the skin with 2-0 Prolene and a sterile bandage was placed. Catheter was left to gravity bag drainage.  Attention was next turned to the right flank. The right flank was interrogated with ultrasound. The right kidney is markedly hydronephrotic. No significant perinephric fluid. A suitable access site on the skin overlying the lower pole, posterior calix was identified. After local anesthesia was achieved, a small skin nick was made with an 11 blade scalpel. A 21 gauge Accustick needle was then advanced under direct sonographic guidance into the lower pole of the right kidney. A 0.018 inch wire was advanced under fluoroscopic guidance into the left renal collecting system. The Accustick sheath was then advanced over the wire and a 0.018 system exchanged for a 0.035 system. Gentle hand injection of contrast  material confirms placement of the sheath within the renal collecting system. An antegrade nephrostogram was performed confirming severe hydroureteronephrosis. There is complete obstruction of the distal ureter at the level of the mid sacral ala.  The tract from the scan into the renal collecting system was then dilated serially to 10-French. A 10-French Cook all-purpose drain was then placed and positioned under fluoroscopic guidance. The locking loop is well formed within the left renal pelvis. The catheter was secured to the skin with 2-0 Prolene and a sterile bandage was placed. Catheter was left to gravity bag drainage.  COMPLICATIONS: None immediate.  IMPRESSION: 1. Moderate left hydronephrosis with evidence of forniceal rupture resulting in moderate perinephric fluid. 2. Severe right hydroureteronephrosis. 3. Successful placement of bilateral 10 French percutaneous nephrostomy tubes. 4. Bilateral percutaneous nephrostograms demonstrate complete obstruction of both ureters at the level of the mid sacrum consistent with malignant obstructive uropathy.  PLAN: Maintained tubes to gravity drainage and follow creatinine. Hematuria is expected to clear over 24-48 hr. Given presumed local recurrence of colon carcinoma in the setting of active lung adenocarcinoma, these tubes will likely be present for the remainder of the patient's life. Once they have healed (4-6 weeks), an effort can be made to cross the obstructions for conversion to percutaneous nephroureteral stents, or double-J ureteral stents if internalization is desired.  Signed,  Criselda Peaches, MD  [facility]  [facility] Radiology   Electronically Signed   By: Jacqulynn Cadet M.D.   On: 04/28/2014 08:01   Ir Perc Nephrostomy Right  04/28/2014   CLINICAL DATA:  73 year old male with a past history of colonic adenocarcinoma treated with neoadjuvant chemoradiation followed by resection and colostomy.  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 He was transferred to the emergency department today from Radiation Oncology for progressive left flank pain, decreased urine output, nausea and vomiting. 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 There is evidence of forniceal rupture on the left with marked perinephric fluid. His baseline creatinine is 0.8 has markedly increased 2 7. He is in acute renal failure and requires emergent bilateral percutaneous nephrostomy tube placement for renal decompression and salvage of renal function.  EXAM: PERC NEPHROSTOMY*L*; RIGHT NEPHROSTOGRAM; PERC NEPHROSTOMY*R*; IR ULTRASOUND GUIDANCE; LEFT NEPHROSTOGRAM  Date: 04/28/2014  PROCEDURE: 1. Ultrasound-guided puncture of the left renal collecting system 2. Left antegrade percutaneous nephrostogram 3. Placement of a 10 French percutaneous nephrostomy tube under fluoroscopic guidance 4. Ultrasound guided puncture of the right renal collecting system 5. Right antegrade percutaneous nephrostogram 6. Placement of a 10 French percutaneous nephrostomy tube under fluoroscopic guidance Interventional Radiologist:  Criselda Peaches, MD  ANESTHESIA/SEDATION: Moderate (conscious) sedation was used. 2.5 mg Versed, 100 mcg Fentanyl were administered intravenously. The patient's vital signs were monitored continuously by radiology nursing throughout the procedure.  Sedation Time: 30 minutes  2g Ancef were administered intravenously within 1 hr of skin incision.  FLUOROSCOPY TIME:  4 minutes 12 seconds  CONTRAST:  37mL OMNIPAQUE IOHEXOL 300 MG/ML  SOLN  TECHNIQUE: Informed consent was obtained from the patient following explanation of the procedure, risks, benefits and alternatives. The patient understands, agrees and consents for the procedure. All questions were addressed. A time out was performed.  Maximal barrier  sterile technique utilized including caps, mask, sterile gowns, sterile gloves, large sterile drape, hand hygiene, and Betadine skin prep.  Attention was first turned to the left flank. The left flank was interrogated with ultrasound. The kidney is moderately hydronephrotic. There is marked perinephric fluid and stranding consistent with the findings on the recent CT scan. A suitable access site on the skin overlying the lower  pole, posterior calix was identified. After local anesthesia was achieved, a small skin nick was made with an 11 blade scalpel. A 21 gauge Accustick needle was then advanced under direct sonographic guidance into the lower pole of the left kidney. A 0.018 inch wire was advanced under fluoroscopic guidance into the left renal collecting system. The Accustick sheath was then advanced over the wire and a 0.018 system exchanged for a 0.035 system. Gentle hand injection of contrast material confirms placement of the sheath within the renal collecting system. An antegrade nephrostogram was performed confirming moderate hydroureteronephrosis. There is complete obstruction of the distal ureter at the level of the mid sacral ala. The tract from the scan into the renal collecting system was then dilated serially to 10-French. A 10-French Cook all-purpose drain was then placed and positioned under fluoroscopic guidance. The locking loop is well formed within the left renal pelvis. The catheter was secured to the skin with 2-0 Prolene and a sterile bandage was placed. Catheter was left to gravity bag drainage.  Attention was next turned to the right flank. The right flank was interrogated with ultrasound. The right kidney is markedly hydronephrotic. No significant perinephric fluid. A suitable access site on the skin overlying the lower pole, posterior calix was identified. After local anesthesia was achieved, a small skin nick was made with an 11 blade scalpel. A 21 gauge Accustick needle was then  advanced under direct sonographic guidance into the lower pole of the right kidney. A 0.018 inch wire was advanced under fluoroscopic guidance into the left renal collecting system. The Accustick sheath was then advanced over the wire and a 0.018 system exchanged for a 0.035 system. Gentle hand injection of contrast material confirms placement of the sheath within the renal collecting system. An antegrade nephrostogram was performed confirming severe hydroureteronephrosis. There is complete obstruction of the distal ureter at the level of the mid sacral ala. The tract from the scan into the renal collecting system was then dilated serially to 10-French. A 10-French Cook all-purpose drain was then placed and positioned under fluoroscopic guidance. The locking loop is well formed within the left renal pelvis. The catheter was secured to the skin with 2-0 Prolene and a sterile bandage was placed. Catheter was left to gravity bag drainage.  COMPLICATIONS: None immediate.  IMPRESSION: 1. Moderate left hydronephrosis with evidence of forniceal rupture resulting in moderate perinephric fluid. 2. Severe right hydroureteronephrosis. 3. Successful placement of bilateral 10 French percutaneous nephrostomy tubes. 4. Bilateral percutaneous nephrostograms demonstrate complete obstruction of both ureters at the level of the mid sacrum consistent with malignant obstructive uropathy.  PLAN: Maintained tubes to gravity drainage and follow creatinine. Hematuria is expected to clear over 24-48 hr. Given presumed local recurrence of colon carcinoma in the setting of active lung adenocarcinoma, these tubes will likely be present for the remainder of the patient's life. Once they have healed (4-6 weeks), an effort can be made to cross the obstructions for conversion to percutaneous nephroureteral stents, or double-J ureteral stents if internalization is desired.  Signed,  Criselda Peaches, MD  [facility]  [facility] Radiology   Electronically Signed   By: Jacqulynn Cadet M.D.   On: 04/28/2014 08:01   Ir US Guide Bx Asp/drain  04/28/2014   CLINICAL DATA:  73 year old male with a past history of colonic adenocarcinoma treated with neoadjuvant chemoradiation followed by resection and colostomy. Additionally, he has active lung adenocarcinoma which is unresectable for which she is undergoing  radiation therapy. He was transferred to the emergency department today from Radiation Oncology for progressive left flank pain, decreased urine output, nausea and vomiting. 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 There is evidence of forniceal rupture on the left with marked perinephric fluid. His baseline creatinine is 0.8 has markedly increased 2 7. He is in acute renal failure and requires emergent bilateral percutaneous nephrostomy tube placement for renal decompression and salvage of renal function.  EXAM: PERC NEPHROSTOMY*L*; RIGHT NEPHROSTOGRAM; PERC NEPHROSTOMY*R*; IR ULTRASOUND GUIDANCE; LEFT NEPHROSTOGRAM  Date: 04/28/2014  PROCEDURE: 1. Ultrasound-guided puncture of the left renal collecting system 2. Left antegrade percutaneous nephrostogram 3. Placement of a 10 French percutaneous nephrostomy tube under fluoroscopic guidance 4. Ultrasound guided puncture of the right renal collecting system 5. Right antegrade percutaneous nephrostogram 6. Placement of a 10 French percutaneous nephrostomy tube under fluoroscopic guidance Interventional Radiologist:  Criselda Peaches, MD  ANESTHESIA/SEDATION: Moderate (conscious) sedation was used. 2.5 mg Versed, 100 mcg Fentanyl were administered intravenously. The patient's vital signs were monitored continuously by radiology nursing throughout the procedure.  Sedation Time: 30 minutes  2g Ancef were administered intravenously within 1 hr of skin incision.  FLUOROSCOPY TIME:  4 minutes 12 seconds  CONTRAST:  37mL  OMNIPAQUE IOHEXOL 300 MG/ML  SOLN  TECHNIQUE: Informed consent was obtained from the patient following explanation of the procedure, risks, benefits and alternatives. The patient understands, agrees and consents for the procedure. All questions were addressed. A time out was performed.  Maximal barrier sterile technique utilized including caps, mask, sterile gowns, sterile gloves, large sterile drape, hand hygiene, and Betadine skin prep.  Attention was first turned to the left flank. The left flank was interrogated with ultrasound. The kidney is moderately hydronephrotic. There is marked perinephric fluid and stranding consistent with the findings on the recent CT scan. A suitable access site on the skin overlying the lower pole, posterior calix was identified. After local anesthesia was achieved, a small skin nick was made with an 11 blade scalpel. A 21 gauge Accustick needle was then advanced under direct sonographic guidance into the lower pole of the left kidney. A 0.018 inch wire was advanced under fluoroscopic guidance into the left renal collecting system. The Accustick sheath was then advanced over the wire and a 0.018 system exchanged for a 0.035 system. Gentle hand injection of contrast material confirms placement of the sheath within the renal collecting system. An antegrade nephrostogram was performed confirming moderate hydroureteronephrosis. There is complete obstruction of the distal ureter at the level of the mid sacral ala. The tract from the scan into the renal collecting system was then dilated serially to 10-French. A 10-French Cook all-purpose drain was then placed and positioned under fluoroscopic guidance. The locking loop is well formed within the left renal pelvis. The catheter was secured to the skin with 2-0 Prolene and a sterile bandage was placed. Catheter was left to gravity bag drainage.  Attention was next turned to the right flank. The right flank was interrogated with ultrasound.  The right kidney is markedly hydronephrotic. No significant perinephric fluid. A suitable access site on the skin overlying the lower pole, posterior calix was identified. After local anesthesia was achieved, a small skin nick was made with an 11 blade scalpel. A 21 gauge Accustick needle was then advanced under direct sonographic guidance into the lower pole of the right kidney. A 0.018 inch wire was advanced under fluoroscopic guidance into the left renal collecting system. The  Accustick sheath was then advanced over the wire and a 0.018 system exchanged for a 0.035 system. Gentle hand injection of contrast material confirms placement of the sheath within the renal collecting system. An antegrade nephrostogram was performed confirming severe hydroureteronephrosis. There is complete obstruction of the distal ureter at the level of the mid sacral ala. The tract from the scan into the renal collecting system was then dilated serially to 10-French. A 10-French Cook all-purpose drain was then placed and positioned under fluoroscopic guidance. The locking loop is well formed within the left renal pelvis. The catheter was secured to the skin with 2-0 Prolene and a sterile bandage was placed. Catheter was left to gravity bag drainage.  COMPLICATIONS: None immediate.  IMPRESSION: 1. Moderate left hydronephrosis with evidence of forniceal rupture resulting in moderate perinephric fluid. 2. Severe right hydroureteronephrosis. 3. Successful placement of bilateral 10 French percutaneous nephrostomy tubes. 4. Bilateral percutaneous nephrostograms demonstrate complete obstruction of both ureters at the level of the mid sacrum consistent with malignant obstructive uropathy.  PLAN: Maintained tubes to gravity drainage and follow creatinine. Hematuria is expected to clear over 24-48 hr. Given presumed local recurrence of colon carcinoma in the setting of active lung adenocarcinoma, these tubes will likely be present for the  remainder of the patient's life. Once they have healed (4-6 weeks), an effort can be made to cross the obstructions for conversion to percutaneous nephroureteral stents, or double-J ureteral stents if internalization is desired.  Signed,  Criselda Peaches, MD  [facility]  [facility] Radiology   Electronically Signed   By: Jacqulynn Cadet M.D.   On: 04/28/2014 08:01   Ir US Guide Bx Asp/drain  04/28/2014   CLINICAL DATA:  73 year old male with a past history of colonic adenocarcinoma treated with neoadjuvant chemoradiation followed by resection and colostomy. 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 He was transferred to the emergency department today from Radiation Oncology for progressive left flank pain, decreased urine output, nausea and vomiting. 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 There is evidence of forniceal rupture on the left with marked perinephric fluid. His baseline creatinine is 0.8 has markedly increased 2 7. He is in acute renal failure and requires emergent bilateral percutaneous nephrostomy tube placement for renal decompression and salvage of renal function.  EXAM: PERC NEPHROSTOMY*L*; RIGHT NEPHROSTOGRAM; PERC NEPHROSTOMY*R*; IR ULTRASOUND GUIDANCE; LEFT NEPHROSTOGRAM  Date: 04/28/2014  PROCEDURE: 1. Ultrasound-guided puncture of the left renal collecting system 2. Left antegrade percutaneous nephrostogram 3. Placement of a 10 French percutaneous nephrostomy tube under fluoroscopic guidance 4. Ultrasound guided puncture of the right renal collecting system 5. Right antegrade percutaneous nephrostogram 6. Placement of a 10 French percutaneous nephrostomy tube under fluoroscopic guidance Interventional Radiologist:  Criselda Peaches, MD  ANESTHESIA/SEDATION: Moderate (conscious) sedation was used. 2.5 mg Versed, 100  mcg Fentanyl were administered intravenously. The patient's vital signs were monitored continuously by radiology nursing throughout the procedure.  Sedation Time: 30 minutes  2g Ancef were administered intravenously within 1 hr of skin incision.  FLUOROSCOPY TIME:  4 minutes 12 seconds  CONTRAST:  65mL OMNIPAQUE IOHEXOL 300 MG/ML  SOLN  TECHNIQUE: Informed consent was obtained from the patient following explanation of the procedure, risks, benefits and alternatives. The patient understands, agrees and consents for the procedure. All questions were addressed. A time out was performed.  Maximal barrier sterile technique utilized including caps, mask, sterile gowns,  sterile gloves, large sterile drape, hand hygiene, and Betadine skin prep.  Attention was first turned to the left flank. The left flank was interrogated with ultrasound. The kidney is moderately hydronephrotic. There is marked perinephric fluid and stranding consistent with the findings on the recent CT scan. A suitable access site on the skin overlying the lower pole, posterior calix was identified. After local anesthesia was achieved, a small skin nick was made with an 11 blade scalpel. A 21 gauge Accustick needle was then advanced under direct sonographic guidance into the lower pole of the left kidney. A 0.018 inch wire was advanced under fluoroscopic guidance into the left renal collecting system. The Accustick sheath was then advanced over the wire and a 0.018 system exchanged for a 0.035 system. Gentle hand injection of contrast material confirms placement of the sheath within the renal collecting system. An antegrade nephrostogram was performed confirming moderate hydroureteronephrosis. There is complete obstruction of the distal ureter at the level of the mid sacral ala. The tract from the scan into the renal collecting system was then dilated serially to 10-French. A 10-French Cook all-purpose drain was then placed and positioned under  fluoroscopic guidance. The locking loop is well formed within the left renal pelvis. The catheter was secured to the skin with 2-0 Prolene and a sterile bandage was placed. Catheter was left to gravity bag drainage.  Attention was next turned to the right flank. The right flank was interrogated with ultrasound. The right kidney is markedly hydronephrotic. No significant perinephric fluid. A suitable access site on the skin overlying the lower pole, posterior calix was identified. After local anesthesia was achieved, a small skin nick was made with an 11 blade scalpel. A 21 gauge Accustick needle was then advanced under direct sonographic guidance into the lower pole of the right kidney. A 0.018 inch wire was advanced under fluoroscopic guidance into the left renal collecting system. The Accustick sheath was then advanced over the wire and a 0.018 system exchanged for a 0.035 system. Gentle hand injection of contrast material confirms placement of the sheath within the renal collecting system. An antegrade nephrostogram was performed confirming severe hydroureteronephrosis. There is complete obstruction of the distal ureter at the level of the mid sacral ala. The tract from the scan into the renal collecting system was then dilated serially to 10-French. A 10-French Cook all-purpose drain was then placed and positioned under fluoroscopic guidance. The locking loop is well formed within the left renal pelvis. The catheter was secured to the skin with 2-0 Prolene and a sterile bandage was placed. Catheter was left to gravity bag drainage.  COMPLICATIONS: None immediate.  IMPRESSION: 1. Moderate left hydronephrosis with evidence of forniceal rupture resulting in moderate perinephric fluid. 2. Severe right hydroureteronephrosis. 3. Successful placement of bilateral 10 French percutaneous nephrostomy tubes. 4. Bilateral percutaneous nephrostograms demonstrate complete obstruction of both ureters at the level of the mid  sacrum consistent with malignant obstructive uropathy.  PLAN: Maintained tubes to gravity drainage and follow creatinine. Hematuria is expected to clear over 24-48 hr. Given presumed local recurrence of colon carcinoma in the setting of active lung adenocarcinoma, these tubes will likely be present for the remainder of the patient's life. Once they have healed (4-6 weeks), an effort can be made to cross the obstructions for conversion to percutaneous nephroureteral stents, or double-J ureteral stents if internalization is desired.  Signed,  Criselda Peaches, MD  [facility]  [facility] Radiology   Electronically Signed  By: Jacqulynn Cadet M.D.   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 S/p (B) PCN 5/22 Excellent drain function and UOP Cr normalized IR will cont to follow    LOS: 2 days    Ascencion Dike PA-C 04/29/2014 9:22 AM

## 2014-04-29 NOTE — Progress Notes (Signed)
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 [name], radiation oncology on 5/22 for simulation and treatment at which time patient complained of left flank pain 10/10. 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 Patient was advised to come to [facility] ED. Creatinine was in the 7 range. Interventional radiology was consulted and recommended transfer to [facility]. Patient underwent emergent bilateral percutaneous nephrostomies on 5/22. Dr. [name] recommends palliative care- called.     Assessment/Plan:  1. Acute renal failure secondary to bilateral hydronephrosis from enlarging malignant pelvic mass: Patient underwent emergent bilateral percutaneous nephrostomy tube placement by IR. Acute renal failure has resolved. DC IVF. FU BMP in am. 2. Obstructive uropathy/bilateral hydronephrosis: Management as above. 3. Pelvic mass/? Local recurrence of colorectal cancer: MRI shows a large necrotic mass or complex fluid collection in the presacral space, extending through the sciatic notch-since this has been present for at least 4 months, necrotic neoplasm is favored (potentially superinfected). Suspected direct extension of tumor or osteomyelitis within the adjacent right sacrum. Discuss with oncology regarding further management. Will get palliative care consultation and depending on how aggressive the patient/family wishes to be, may  consider biopsy of pelvic mass by interventional radiology. Patient does not look septic or toxic and does not have fever or leukocytosis. Will hold Abx unless develops features suggestive of infection. Recently had rectal pain and underwent flexible sigmoidoscopy on 04/09/14 which showed fistula leading radiation proctitis-was on antibiotics. Discussed with surgeon on call on 5/23: ? Patient case probably discussed at multidisciplinary Oncology conference. OP FU with oncology and Mx as deemed necessary. To discuss with Dr. [name], Rad Onc and Dr. Marcello Moores,  CC[facility] on 5/26, if he is still here. 4. Anemia: Secondary to chronic disease. Follow CBCs closely and transfuse if hemoglobin less than 7 g per DL. Stable. 5. Hypokalemia/Hypomagnesemia: Replace and follow BMP. 6. T3, N0, M0 adenocarcinoma of left upper lung: Outpatient followup with radiation oncology. 7. Severe malnutrition in the context of chronic illness: Management per dietitian consultation. 8. History of COPD: Stable 9. Status post colostomy:   Code Status: Full Family Communication: Discussed with spouse on 5/23, updated care and answered questions. Disposition Plan: Home when medically stable.   Consultants:  Interventional radiology  Palliative Care team- pending  Discussed with Dr. Thea Silversmith, radiation oncology on 5/23-agrees with plan of care for pelvic mass as outlined above.  Procedures:  Bilateral percutaneous nephrostomies   Antibiotics:  None   Subjective: Denies complaints. Denies flank pain, abdominal pain, pelvic pain, rectal pain, nausea, vomiting. Anxious to go home.  Objective: Filed Vitals:   04/28/14 1445 04/28/14 2125 04/29/14 0532 04/29/14 0920  BP: 137/50 165/77 156/73 161/69  Pulse: 89 77 79 78  Temp: 99.2 F (37.3 C) 98.3 F (36.8 C) 98.2 F (36.8 C) 97.8 F (36.6 C)  TempSrc: Oral Oral Oral Oral  Resp: 15 16 16 16   Height:  5' 9.5" (1.765 m)    Weight:  52.844 kg (116 lb 8 oz)      SpO2: 96% 95% 94% 97%  Intake/Output Summary (Last 24 hours) at 04/29/14 1303 Last data filed at 04/29/14 1238  Gross per 24 hour  Intake   3085 ml  Output   5075 ml  Net  -1990 ml   Filed Weights   04/27/14 2112 04/28/14 2125  Weight: 54.613 kg (120 lb 6.4 oz) 52.844 kg (116 lb 8 oz)     Exam:  General exam: Elderly frail male lying comfortably in bed. Respiratory system: Clear. No increased work of breathing. Cardiovascular system: S1 & S2 heard, RRR. No JVD, murmurs, gallops, clicks or pedal edema. Telemetry: Sinus rhythm. Gastrointestinal system: Abdomen is nondistended, soft and nontender. Normal bowel sounds heard. Colostomy in place with soft green stools. Bilateral percutaneous nephrostomy draining straw-colored urine. Central nervous system: Alert and oriented. No focal neurological deficits. Extremities: Symmetric 5 x 5 power.   Data Reviewed: Basic Metabolic Panel:  Recent Labs Lab 04/27/14 1710 04/27/14 1720 04/28/14 0459 04/28/14 2015 04/29/14 0650  NA 135* 138 141 139 139  K 3.4* 3.3* 3.0* 3.4* 3.3*  CL 96 102 108 105 105  CO2 18*  --  19 21 22   GLUCOSE 90 86 88 155* 92  BUN 45* 42* 35* 20 12  CREATININE 6.74* 7.00* 4.57* 1.81* 1.16  CALCIUM 8.4  --  7.8* 7.6* 7.7*  MG  --   --   --  1.2*  --    Liver Function Tests: No results found for this basename: AST, ALT, ALKPHOS, BILITOT, PROT, ALBUMIN,  in the last 168 hours No results found for this basename: LIPASE, AMYLASE,  in the last 168 hours No results found for this basename: AMMONIA,  in the last 168 hours CBC:  Recent Labs Lab 04/27/14 1710 04/27/14 1720 04/28/14 0459 04/28/14 2015 04/29/14 0650  WBC 9.6  --  9.8 10.0 10.5  NEUTROABS 7.8*  --   --   --   --   HGB 8.7* 8.2* 7.1* 7.2* 7.6*  HCT 26.1* 24.0* 21.0* 21.5* 22.2*  MCV 77.9*  --  78.1 79.6 77.9*  PLT PLATELET CLUMPS NOTED ON SMEAR, COUNT APPEARS INCREASED  --  253 365 429*   Cardiac Enzymes: No results found for this  basename: CKTOTAL, CKMB, CKMBINDEX, TROPONINI,  in the last 168 hours BNP (last 3 results) No results found for this basename: PROBNP,  in the last 8760 hours CBG: No results found for this basename: GLUCAP,  in the last 168 hours  Recent Results (from the past 240 hour(s))  CULTURE, BLOOD (ROUTINE X 2)     Status: None   Collection Time    04/27/14  5:10 PM      Result Value Ref Range Status   Specimen Description BLOOD RIGHT ARM   Final   Special Requests BOTTLES DRAWN AEROBIC AND ANAEROBIC 5CC   Final   Culture  Setup Time     Final   Value: 04/28/2014 04:03     Performed at [facility]   Culture     Final   Value:        BLOOD CULTURE RECEIVED NO GROWTH TO DATE CULTURE WILL BE HELD FOR 5 DAYS BEFORE ISSUING A FINAL NEGATIVE REPORT     Performed at [facility]   Report Status PENDING   Incomplete  URINE CULTURE     Status: None   Collection Time    04/27/14  5:22 PM      Result Value Ref Range Status   Specimen Description URINE, RANDOM  Final   Special Requests NONE   Final   Culture  Setup Time     Final   Value: 04/27/2014 23:38     Performed at [place]     Final   Value: 3,000 COLONIES/ML     Performed at [facility]   Culture     Final   Value: INSIGNIFICANT GROWTH     Performed at [facility]   Report Status 04/28/2014 FINAL   Final  CULTURE, BLOOD (ROUTINE X 2)     Status: None   Collection Time    04/27/14  5:25 PM      Result Value Ref Range Status   Specimen Description BLOOD RIGHT FOREARM   Final   Special Requests BOTTLES DRAWN AEROBIC AND ANAEROBIC 5ML   Final   Culture  Setup Time     Final   Value: 04/27/2014 22:42     Performed at [facility]   Culture     Final   Value:        BLOOD CULTURE RECEIVED NO GROWTH TO DATE CULTURE WILL BE HELD FOR 5 DAYS BEFORE ISSUING A FINAL NEGATIVE REPORT     Performed at [facility]   Report Status PENDING   Incomplete          Studies: Mr Pelvis Wo Contrast  04/28/2014   CLINICAL DATA:  Enlarging pelvic mass causing hydronephrosis on recent CT. Remote history of colon cancer. Currently being treated for lung cancer. 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 Recurrent rectal cancer unlikely 20 years after his treatment, but in the differential diagnosis.  EXAM: MRI PELVIS WITHOUT CONTRAST  TECHNIQUE: Multiplanar multisequence MR imaging of the pelvis was performed. No intravenous contrast was administered.  COMPARISON:  Abdominal pelvic CT 04/27/2014.  PET-CT 12/21/2013.  FINDINGS: Examination is limited by motion and lack of contrast.  As seen on recent CT, there is a large complex mass or fluid collection in the presacral space, extending off midline to the right through the sciatic foramen. This measures approximately 5.3 x 8.7 cm transverse and 6.7 cm cephalocaudad. This process of buttocks and erodes the anterior cortex of the sacrum. There is diffuse replacement of the normal marrow signal throughout the right aspect of the sacrum. The sacroiliac joints appear normal. The remainder of the bony pelvis appears normal.  As correlated with the CT, this presacral process is intimately associated with and indistinguishable from the rectum. Patient is status post descending colostomy. Ureterectasis appears improved following recent bilateral percutaneous nephrostomy. The urinary bladder is mildly distended. There is generalized soft tissue edema throughout the pelvis and lower abdomen. Small bilateral hip joint effusions are noted.  IMPRESSION: 1. Again demonstrated is a large necrotic mass or complex fluid collection in the presacral space, extending through the sciatic notch. Since this has been present for at least 4 months, necrotic neoplasm is favored (potentially superinfected). 2. Adjacent cortical destruction and abnormal signal within  the right aspect of the sacrum suspicious for either direct extension of tumor or osteomyelitis. 3. Improved bilateral ureterectasis status post bilateral ureteral stent placement. 4. The presacral process is amenable to percutaneous transgluteal drainage/biopsy.   Electronically Signed   By: Camie Patience M.D.   On: 04/28/2014 11:10   Ct Abdomen Pelvis W Contrast  04/27/2014   CLINICAL DATA:  Left flank pain.  History of colon cancer.  EXAM:  CT ABDOMEN AND PELVIS WITH CONTRAST  TECHNIQUE: Multidetector CT imaging of the abdomen and pelvis was performed using the standard protocol following bolus administration of intravenous contrast.  CONTRAST:  154mL OMNIPAQUE IOHEXOL 300 MG/ML  SOLN  COMPARISON:  CT abdomen pelvis 03/21/2014.  FINDINGS: Visualization of the lower thorax demonstrates new small left pleural effusion. Normal heart size.  Unchanged multiple sub cm low-attenuation lesions within the liver. Focal fatty deposition adjacent to the falciform ligament. Hepatic and portal veins are patent. Gallbladder is unremarkable. Spleen, pancreas and bilateral adrenal glands are unremarkable.  Patient status post low anterior resection. 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 Adjacent to the resection site within the low anatomic pelvis is a complex soft tissue, gas and fluid containing mass located anterior to and adjacent to the sacrum measuring 8.2 x 5.7 cm, slightly increased when compared to prior examination. Interval increase in now moderate amount of ascites.  There is new severe bilateral hydroureteronephrosis with bilateral delayed nephrograms, worse within the right kidney with extensive left-greater-than-right perinephric fluid and fat stranding. Delayed phase images demonstrate excretion of contrast within the left renal collecting system. No excretion of contrast is demonstrated within the right renal collecting system. This new marked  hydroureteronephrosis likely secondary to the worsening above described pelvic soft tissue, fluid and gas collection. The urinary bladder is distended.  Re- demonstrated nonspecific 1.1 cm enhancing mass along the posterior left lateral aspect of the urinary bladder. Central dystrophic calcifications within the prostate. Oral contrast material is demonstrated throughout the small bowel. No definite evidence for bowel obstruction.  No definite aggressive or acute appearing osseous lesions. Mild mottled appearance of the sacrum. Bilateral L5 pars defects.  IMPRESSION: 1. Interval worsening of large irregular soft tissue mass fluid/gas collection within the presacral space and involving parasacral musculature. Findings may be secondary to an inflammatory/infectious mass however given the history of malignancy, local recurrence is a consideration. 2. There is now involvement of the distal aspect of the bilateral ureters by the large soft tissue mass within the presacral region causing severe bilateral hydronephrosis, right-greater-than-left with extensive perinephric fat stranding as well as delayed nephrograms. No excretion from the right kidney with minimal excretion from the left kidney compatible with high-grade obstruction. 3. 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 4. Interval increase in moderate ascites and anasarca. 5. New small left pleural effusion. 6. Nonspecific enhancing mass along the posterior lateral aspect of the bladder wall potentially secondary to infectious or inflammatory process, ureterocele or metastatic lesion. 7. Mild mottled appearance of the sacrum. Osteomyelitis not excluded. These results were called by telephone at the time of interpretation on 04/27/2014 at 3:33 PM to Dr. Tyler Pita , who verbally acknowledged these results.   Electronically Signed   By: Lovey Newcomer M.D.   On: 04/27/2014 15:53   Ir Nephrostogram Left  04/28/2014   CLINICAL DATA:   73 year old male with a past history of colonic adenocarcinoma treated with neoadjuvant chemoradiation followed by resection and colostomy. 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 He was transferred to the emergency department today from Radiation Oncology for progressive left flank pain, decreased urine output, nausea and vomiting. 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 There is evidence of forniceal rupture on the left with marked perinephric fluid. His baseline creatinine is 0.8 has markedly increased 2 7. He is in acute renal failure  and requires emergent bilateral percutaneous nephrostomy tube placement for renal decompression and salvage of renal function.  EXAM: PERC NEPHROSTOMY*L*; RIGHT NEPHROSTOGRAM; PERC NEPHROSTOMY*R*; IR ULTRASOUND GUIDANCE; LEFT NEPHROSTOGRAM  Date: 04/28/2014  PROCEDURE: 1. Ultrasound-guided puncture of the left renal collecting system 2. Left antegrade percutaneous nephrostogram 3. Placement of a 10 French percutaneous nephrostomy tube under fluoroscopic guidance 4. Ultrasound guided puncture of the right renal collecting system 5. Right antegrade percutaneous nephrostogram 6. Placement of a 10 French percutaneous nephrostomy tube under fluoroscopic guidance Interventional Radiologist:  Criselda Peaches, MD  ANESTHESIA/SEDATION: Moderate (conscious) sedation was used. 2.5 mg Versed, 100 mcg Fentanyl were administered intravenously. The patient's vital signs were monitored continuously by radiology nursing throughout the procedure.  Sedation Time: 30 minutes  2g Ancef were administered intravenously within 1 hr of skin incision.  FLUOROSCOPY TIME:  4 minutes 12 seconds  CONTRAST:  92mL OMNIPAQUE IOHEXOL 300 MG/ML  SOLN  TECHNIQUE: Informed consent was obtained from the patient following explanation of the procedure, risks, benefits and alternatives. The  patient understands, agrees and consents for the procedure. All questions were addressed. A time out was performed.  Maximal barrier sterile technique utilized including caps, mask, sterile gowns, sterile gloves, large sterile drape, hand hygiene, and Betadine skin prep.  Attention was first turned to the left flank. The left flank was interrogated with ultrasound. The kidney is moderately hydronephrotic. There is marked perinephric fluid and stranding consistent with the findings on the recent CT scan. A suitable access site on the skin overlying the lower pole, posterior calix was identified. After local anesthesia was achieved, a small skin nick was made with an 11 blade scalpel. A 21 gauge Accustick needle was then advanced under direct sonographic guidance into the lower pole of the left kidney. A 0.018 inch wire was advanced under fluoroscopic guidance into the left renal collecting system. The Accustick sheath was then advanced over the wire and a 0.018 system exchanged for a 0.035 system. Gentle hand injection of contrast material confirms placement of the sheath within the renal collecting system. An antegrade nephrostogram was performed confirming moderate hydroureteronephrosis. There is complete obstruction of the distal ureter at the level of the mid sacral ala. The tract from the scan into the renal collecting system was then dilated serially to 10-French. A 10-French Cook all-purpose drain was then placed and positioned under fluoroscopic guidance. The locking loop is well formed within the left renal pelvis. The catheter was secured to the skin with 2-0 Prolene and a sterile bandage was placed. Catheter was left to gravity bag drainage.  Attention was next turned to the right flank. The right flank was interrogated with ultrasound. The right kidney is markedly hydronephrotic. No significant perinephric fluid. A suitable access site on the skin overlying the lower pole, posterior calix was identified.  After local anesthesia was achieved, a small skin nick was made with an 11 blade scalpel. A 21 gauge Accustick needle was then advanced under direct sonographic guidance into the lower pole of the right kidney. A 0.018 inch wire was advanced under fluoroscopic guidance into the left renal collecting system. The Accustick sheath was then advanced over the wire and a 0.018 system exchanged for a 0.035 system. Gentle hand injection of contrast material confirms placement of the sheath within the renal collecting system. An antegrade nephrostogram was performed confirming severe hydroureteronephrosis. There is complete obstruction of the distal ureter at the level of the mid sacral ala. The tract from the scan into the renal  collecting system was then dilated serially to 10-French. A 10-French Cook all-purpose drain was then placed and positioned under fluoroscopic guidance. The locking loop is well formed within the left renal pelvis. The catheter was secured to the skin with 2-0 Prolene and a sterile bandage was placed. Catheter was left to gravity bag drainage.  COMPLICATIONS: None immediate.  IMPRESSION: 1. Moderate left hydronephrosis with evidence of forniceal rupture resulting in moderate perinephric fluid. 2. Severe right hydroureteronephrosis. 3. Successful placement of bilateral 10 French percutaneous nephrostomy tubes. 4. Bilateral percutaneous nephrostograms demonstrate complete obstruction of both ureters at the level of the mid sacrum consistent with malignant obstructive uropathy.  PLAN: Maintained tubes to gravity drainage and follow creatinine. Hematuria is expected to clear over 24-48 hr. Given presumed local recurrence of colon carcinoma in the setting of active lung adenocarcinoma, these tubes will likely be present for the remainder of the patient's life. Once they have healed (4-6 weeks), an effort can be made to cross the obstructions for conversion to percutaneous nephroureteral stents, or  double-J ureteral stents if internalization is desired.  Signed,  Criselda Peaches, MD  [facility]  [facility]   Electronically Signed   By: Jacqulynn Cadet M.D.   On: 04/28/2014 08:01   Ir Nephrostogram Right  04/28/2014   CLINICAL DATA:  73 year old male with a past history of colonic adenocarcinoma treated with neoadjuvant chemoradiation followed by resection and colostomy. 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 He was transferred to the emergency department today from Radiation Oncology for progressive left flank pain, decreased urine output, nausea and vomiting. 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 There is evidence of forniceal rupture on the left with marked perinephric fluid. His baseline creatinine is 0.8 has markedly increased 2 7. He is in acute renal failure and requires emergent bilateral percutaneous nephrostomy tube placement for renal decompression and salvage of renal function.  EXAM: PERC NEPHROSTOMY*L*; RIGHT NEPHROSTOGRAM; PERC NEPHROSTOMY*R*; IR ULTRASOUND GUIDANCE; LEFT NEPHROSTOGRAM  Date: 04/28/2014  PROCEDURE: 1. Ultrasound-guided puncture of the left renal collecting system 2. Left antegrade percutaneous nephrostogram 3. Placement of a 10 French percutaneous nephrostomy tube under fluoroscopic guidance 4. Ultrasound guided puncture of the right renal collecting system 5. Right antegrade percutaneous nephrostogram 6. Placement of a 10 French percutaneous nephrostomy tube under fluoroscopic guidance Interventional Radiologist:  Criselda Peaches, MD  ANESTHESIA/SEDATION: Moderate (conscious) sedation was used. 2.5 mg Versed, 100 mcg Fentanyl were administered intravenously. The patient's vital signs were monitored continuously by radiology nursing throughout the procedure.  Sedation Time: 30 minutes  2g  Ancef were administered intravenously within 1 hr of skin incision.  FLUOROSCOPY TIME:  4 minutes 12 seconds  CONTRAST:  31mL OMNIPAQUE IOHEXOL 300 MG/ML  SOLN  TECHNIQUE: Informed consent was obtained from the patient following explanation of the procedure, risks, benefits and alternatives. The patient understands, agrees and consents for the procedure. All questions were addressed. A time out was performed.  Maximal barrier sterile technique utilized including caps, mask, sterile gowns, sterile gloves, large sterile drape, hand hygiene, and Betadine skin prep.  Attention was first turned to the left flank. The left flank was interrogated with ultrasound. The kidney is moderately hydronephrotic. There is marked perinephric fluid and stranding consistent with the findings on the recent CT scan. A suitable access site on the skin overlying the lower pole, posterior calix was identified. After local anesthesia was  achieved, a small skin nick was made with an 11 blade scalpel. A 21 gauge Accustick needle was then advanced under direct sonographic guidance into the lower pole of the left kidney. A 0.018 inch wire was advanced under fluoroscopic guidance into the left renal collecting system. The Accustick sheath was then advanced over the wire and a 0.018 system exchanged for a 0.035 system. Gentle hand injection of contrast material confirms placement of the sheath within the renal collecting system. An antegrade nephrostogram was performed confirming moderate hydroureteronephrosis. There is complete obstruction of the distal ureter at the level of the mid sacral ala. The tract from the scan into the renal collecting system was then dilated serially to 10-French. A 10-French Cook all-purpose drain was then placed and positioned under fluoroscopic guidance. The locking loop is well formed within the left renal pelvis. The catheter was secured to the skin with 2-0 Prolene and a sterile bandage was placed. Catheter was  left to gravity bag drainage.  Attention was next turned to the right flank. The right flank was interrogated with ultrasound. The right kidney is markedly hydronephrotic. No significant perinephric fluid. A suitable access site on the skin overlying the lower pole, posterior calix was identified. After local anesthesia was achieved, a small skin nick was made with an 11 blade scalpel. A 21 gauge Accustick needle was then advanced under direct sonographic guidance into the lower pole of the right kidney. A 0.018 inch wire was advanced under fluoroscopic guidance into the left renal collecting system. The Accustick sheath was then advanced over the wire and a 0.018 system exchanged for a 0.035 system. Gentle hand injection of contrast material confirms placement of the sheath within the renal collecting system. An antegrade nephrostogram was performed confirming severe hydroureteronephrosis. There is complete obstruction of the distal ureter at the level of the mid sacral ala. The tract from the scan into the renal collecting system was then dilated serially to 10-French. A 10-French Cook all-purpose drain was then placed and positioned under fluoroscopic guidance. The locking loop is well formed within the left renal pelvis. The catheter was secured to the skin with 2-0 Prolene and a sterile bandage was placed. Catheter was left to gravity bag drainage.  COMPLICATIONS: None immediate.  IMPRESSION: 1. Moderate left hydronephrosis with evidence of forniceal rupture resulting in moderate perinephric fluid. 2. Severe right hydroureteronephrosis. 3. Successful placement of bilateral 10 French percutaneous nephrostomy tubes. 4. Bilateral percutaneous nephrostograms demonstrate complete obstruction of both ureters at the level of the mid sacrum consistent with malignant obstructive uropathy.  PLAN: Maintained tubes to gravity drainage and follow creatinine. Hematuria is expected to clear over 24-48 hr. Given presumed local  recurrence of colon carcinoma in the setting of active lung adenocarcinoma, these tubes will likely be present for the remainder of the patient's life. Once they have healed (4-6 weeks), an effort can be made to cross the obstructions for conversion to percutaneous nephroureteral stents, or double-J ureteral stents if internalization is desired.  Signed,  Criselda Peaches, MD  [facility]  [facility] Radiology   Electronically Signed   By: Jacqulynn Cadet M.D.   On: 04/28/2014 08:01   Ir Perc Nephrostomy Left  04/28/2014   CLINICAL DATA:  73 year old male with a past history of colonic adenocarcinoma treated with neoadjuvant chemoradiation followed by resection and colostomy. 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 He was transferred to the emergency department today  from Radiation Oncology for progressive left flank pain, decreased urine output, nausea and vomiting. 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 There is evidence of forniceal rupture on the left with marked perinephric fluid. His baseline creatinine is 0.8 has markedly increased 2 7. He is in acute renal failure and requires emergent bilateral percutaneous nephrostomy tube placement for renal decompression and salvage of renal function.  EXAM: PERC NEPHROSTOMY*L*; RIGHT NEPHROSTOGRAM; PERC NEPHROSTOMY*R*; IR ULTRASOUND GUIDANCE; LEFT NEPHROSTOGRAM  Date: 04/28/2014  PROCEDURE: 1. Ultrasound-guided puncture of the left renal collecting system 2. Left antegrade percutaneous nephrostogram 3. Placement of a 10 French percutaneous nephrostomy tube under fluoroscopic guidance 4. Ultrasound guided puncture of the right renal collecting system 5. Right antegrade percutaneous nephrostogram 6. Placement of a 10 French percutaneous nephrostomy tube under fluoroscopic guidance Interventional  Radiologist:  Criselda Peaches, MD  ANESTHESIA/SEDATION: Moderate (conscious) sedation was used. 2.5 mg Versed, 100 mcg Fentanyl were administered intravenously. The patient's vital signs were monitored continuously by radiology nursing throughout the procedure.  Sedation Time: 30 minutes  2g Ancef were administered intravenously within 1 hr of skin incision.  FLUOROSCOPY TIME:  4 minutes 12 seconds  CONTRAST:  75mL OMNIPAQUE IOHEXOL 300 MG/ML  SOLN  TECHNIQUE: Informed consent was obtained from the patient following explanation of the procedure, risks, benefits and alternatives. The patient understands, agrees and consents for the procedure. All questions were addressed. A time out was performed.  Maximal barrier sterile technique utilized including caps, mask, sterile gowns, sterile gloves, large sterile drape, hand hygiene, and Betadine skin prep.  Attention was first turned to the left flank. The left flank was interrogated with ultrasound. The kidney is moderately hydronephrotic. There is marked perinephric fluid and stranding consistent with the findings on the recent CT scan. A suitable access site on the skin overlying the lower pole, posterior calix was identified. After local anesthesia was achieved, a small skin nick was made with an 11 blade scalpel. A 21 gauge Accustick needle was then advanced under direct sonographic guidance into the lower pole of the left kidney. A 0.018 inch wire was advanced under fluoroscopic guidance into the left renal collecting system. The Accustick sheath was then advanced over the wire and a 0.018 system exchanged for a 0.035 system. Gentle hand injection of contrast material confirms placement of the sheath within the renal collecting system. An antegrade nephrostogram was performed confirming moderate hydroureteronephrosis. There is complete obstruction of the distal ureter at the level of the mid sacral ala. The tract from the scan into the renal collecting system was  then dilated serially to 10-French. A 10-French Cook all-purpose drain was then placed and positioned under fluoroscopic guidance. The locking loop is well formed within the left renal pelvis. The catheter was secured to the skin with 2-0 Prolene and a sterile bandage was placed. Catheter was left to gravity bag drainage.  Attention was next turned to the right flank. The right flank was interrogated with ultrasound. The right kidney is markedly hydronephrotic. No significant perinephric fluid. A suitable access site on the skin overlying the lower pole, posterior calix was identified. After local anesthesia was achieved, a small skin nick was made with an 11 blade scalpel. A 21 gauge Accustick needle was then advanced under direct sonographic guidance into the lower pole of the right kidney. A 0.018 inch wire was advanced under fluoroscopic guidance into the left renal collecting system. The Accustick sheath was then advanced over the wire and a  0.018 system exchanged for a 0.035 system. Gentle hand injection of contrast material confirms placement of the sheath within the renal collecting system. An antegrade nephrostogram was performed confirming severe hydroureteronephrosis. There is complete obstruction of the distal ureter at the level of the mid sacral ala. The tract from the scan into the renal collecting system was then dilated serially to 10-French. A 10-French Cook all-purpose drain was then placed and positioned under fluoroscopic guidance. The locking loop is well formed within the left renal pelvis. The catheter was secured to the skin with 2-0 Prolene and a sterile bandage was placed. Catheter was left to gravity bag drainage.  COMPLICATIONS: None immediate.  IMPRESSION: 1. Moderate left hydronephrosis with evidence of forniceal rupture resulting in moderate perinephric fluid. 2. Severe right hydroureteronephrosis. 3. Successful placement of bilateral 10 French percutaneous nephrostomy tubes. 4.  Bilateral percutaneous nephrostograms demonstrate complete obstruction of both ureters at the level of the mid sacrum consistent with malignant obstructive uropathy.  PLAN: Maintained tubes to gravity drainage and follow creatinine. Hematuria is expected to clear over 24-48 hr. Given presumed local recurrence of colon carcinoma in the setting of active lung adenocarcinoma, these tubes will likely be present for the remainder of the patient's life. Once they have healed (4-6 weeks), an effort can be made to cross the obstructions for conversion to percutaneous nephroureteral stents, or double-J ureteral stents if internalization is desired.  Signed,  Criselda Peaches, MD  [facility]  [facility] Radiology   Electronically Signed   By: Jacqulynn Cadet M.D.   On: 04/28/2014 08:01   Ir Perc Nephrostomy Right  04/28/2014   CLINICAL DATA:  73 year old male with a past history of colonic adenocarcinoma treated with neoadjuvant chemoradiation followed by resection and colostomy. 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 He was transferred to the emergency department today from Radiation Oncology for progressive left flank pain, decreased urine output, nausea and vomiting. 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 There is evidence of forniceal rupture on the left with marked perinephric fluid. His baseline creatinine is 0.8 has markedly increased 2 7. He is in acute renal failure and requires emergent bilateral percutaneous nephrostomy tube placement for renal decompression and salvage of renal function.  EXAM: PERC NEPHROSTOMY*L*; RIGHT NEPHROSTOGRAM; PERC NEPHROSTOMY*R*; IR ULTRASOUND GUIDANCE; LEFT NEPHROSTOGRAM  Date: 04/28/2014  PROCEDURE: 1. Ultrasound-guided puncture of the left renal collecting system 2. Left antegrade percutaneous nephrostogram  3. Placement of a 10 French percutaneous nephrostomy tube under fluoroscopic guidance 4. Ultrasound guided puncture of the right renal collecting system 5. Right antegrade percutaneous nephrostogram 6. Placement of a 10 French percutaneous nephrostomy tube under fluoroscopic guidance Interventional Radiologist:  Criselda Peaches, MD  ANESTHESIA/SEDATION: Moderate (conscious) sedation was used. 2.5 mg Versed, 100 mcg Fentanyl were administered intravenously. The patient's vital signs were monitored continuously by radiology nursing throughout the procedure.  Sedation Time: 30 minutes  2g Ancef were administered intravenously within 1 hr of skin incision.  FLUOROSCOPY TIME:  4 minutes 12 seconds  CONTRAST:  85mL OMNIPAQUE IOHEXOL 300 MG/ML  SOLN  TECHNIQUE: Informed consent was obtained from the patient following explanation of the procedure, risks, benefits and alternatives. The patient understands, agrees and consents for the procedure. All questions were addressed. A time out was performed.  Maximal barrier sterile technique utilized including caps, mask, sterile gowns, sterile gloves, large sterile drape, hand hygiene, and Betadine skin prep.  Attention was first turned to the left flank. The left flank was interrogated with ultrasound. The kidney is moderately hydronephrotic. There is marked perinephric fluid and stranding consistent with the findings on the recent CT scan. A suitable access site on the skin overlying the lower pole, posterior calix was identified. After local anesthesia was achieved, a small skin nick was made with an 11 blade scalpel. A 21 gauge Accustick needle was then advanced under direct sonographic guidance into the lower pole of the left kidney. A 0.018 inch wire was advanced under fluoroscopic guidance into the left renal collecting system. The Accustick sheath was then advanced over the wire and a 0.018 system exchanged for a 0.035 system. Gentle hand injection of contrast material  confirms placement of the sheath within the renal collecting system. An antegrade nephrostogram was performed confirming moderate hydroureteronephrosis. There is complete obstruction of the distal ureter at the level of the mid sacral ala. The tract from the scan into the renal collecting system was then dilated serially to 10-French. A 10-French Cook all-purpose drain was then placed and positioned under fluoroscopic guidance. The locking loop is well formed within the left renal pelvis. The catheter was secured to the skin with 2-0 Prolene and a sterile bandage was placed. Catheter was left to gravity bag drainage.  Attention was next turned to the right flank. The right flank was interrogated with ultrasound. The right kidney is markedly hydronephrotic. No significant perinephric fluid. A suitable access site on the skin overlying the lower pole, posterior calix was identified. After local anesthesia was achieved, a small skin nick was made with an 11 blade scalpel. A 21 gauge Accustick needle was then advanced under direct sonographic guidance into the lower pole of the right kidney. A 0.018 inch wire was advanced under fluoroscopic guidance into the left renal collecting system. The Accustick sheath was then advanced over the wire and a 0.018 system exchanged for a 0.035 system. Gentle hand injection of contrast material confirms placement of the sheath within the renal collecting system. An antegrade nephrostogram was performed confirming severe hydroureteronephrosis. There is complete obstruction of the distal ureter at the level of the mid sacral ala. The tract from the scan into the renal collecting system was then dilated serially to 10-French. A 10-French Cook all-purpose drain was then placed and positioned under fluoroscopic guidance. The locking loop is well formed within the left renal pelvis. The catheter was secured to the skin with 2-0 Prolene and a sterile bandage was placed. Catheter was left to  gravity bag drainage.  COMPLICATIONS: None immediate.  IMPRESSION: 1. Moderate left hydronephrosis with evidence of forniceal rupture resulting in moderate perinephric fluid. 2. Severe right hydroureteronephrosis. 3. Successful placement of bilateral 10 French percutaneous nephrostomy tubes. 4. Bilateral percutaneous nephrostograms demonstrate complete obstruction of both ureters at the level of the mid sacrum consistent with malignant obstructive uropathy.  PLAN: Maintained tubes to gravity drainage and follow creatinine. Hematuria is expected to clear over 24-48 hr. Given presumed local recurrence of colon carcinoma in the setting of active lung adenocarcinoma, these tubes will likely be present for the remainder of the patient's life. Once they have healed (4-6 weeks), an effort can be made to cross the obstructions for conversion to percutaneous nephroureteral stents, or double-J ureteral stents if internalization is desired.  Signed,  Criselda Peaches, MD  [facility]  [facility] Radiology   Electronically Signed   By: Jacqulynn Cadet M.D.   On: 04/28/2014 08:01  Ir US Guide Bx Asp/drain  04/28/2014   CLINICAL DATA:  73 year old male with a past history of colonic adenocarcinoma treated with neoadjuvant chemoradiation followed by resection and colostomy. 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 He was transferred to the emergency department today from Radiation Oncology for progressive left flank pain, decreased urine output, nausea and vomiting. 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 There is evidence of forniceal rupture on the left with marked perinephric fluid. His baseline creatinine is 0.8 has markedly increased 2 7. He is in acute renal failure and requires emergent bilateral percutaneous nephrostomy tube placement for renal  decompression and salvage of renal function.  EXAM: PERC NEPHROSTOMY*L*; RIGHT NEPHROSTOGRAM; PERC NEPHROSTOMY*R*; IR ULTRASOUND GUIDANCE; LEFT NEPHROSTOGRAM  Date: 04/28/2014  PROCEDURE: 1. Ultrasound-guided puncture of the left renal collecting system 2. Left antegrade percutaneous nephrostogram 3. Placement of a 10 French percutaneous nephrostomy tube under fluoroscopic guidance 4. Ultrasound guided puncture of the right renal collecting system 5. Right antegrade percutaneous nephrostogram 6. Placement of a 10 French percutaneous nephrostomy tube under fluoroscopic guidance Interventional Radiologist:  Criselda Peaches, MD  ANESTHESIA/SEDATION: Moderate (conscious) sedation was used. 2.5 mg Versed, 100 mcg Fentanyl were administered intravenously. The patient's vital signs were monitored continuously by radiology nursing throughout the procedure.  Sedation Time: 30 minutes  2g Ancef were administered intravenously within 1 hr of skin incision.  FLUOROSCOPY TIME:  4 minutes 12 seconds  CONTRAST:  28mL OMNIPAQUE IOHEXOL 300 MG/ML  SOLN  TECHNIQUE: Informed consent was obtained from the patient following explanation of the procedure, risks, benefits and alternatives. The patient understands, agrees and consents for the procedure. All questions were addressed. A time out was performed.  Maximal barrier sterile technique utilized including caps, mask, sterile gowns, sterile gloves, large sterile drape, hand hygiene, and Betadine skin prep.  Attention was first turned to the left flank. The left flank was interrogated with ultrasound. The kidney is moderately hydronephrotic. There is marked perinephric fluid and stranding consistent with the findings on the recent CT scan. A suitable access site on the skin overlying the lower pole, posterior calix was identified. After local anesthesia was achieved, a small skin nick was made with an 11 blade scalpel. A 21 gauge Accustick needle was then advanced under direct  sonographic guidance into the lower pole of the left kidney. A 0.018 inch wire was advanced under fluoroscopic guidance into the left renal collecting system. The Accustick sheath was then advanced over the wire and a 0.018 system exchanged for a 0.035 system. Gentle hand injection of contrast material confirms placement of the sheath within the renal collecting system. An antegrade nephrostogram was performed confirming moderate hydroureteronephrosis. There is complete obstruction of the distal ureter at the level of the mid sacral ala. The tract from the scan into the renal collecting system was then dilated serially to 10-French. A 10-French Cook all-purpose drain was then placed and positioned under fluoroscopic guidance. The locking loop is well formed within the left renal pelvis. The catheter was secured to the skin with 2-0 Prolene and a sterile bandage was placed. Catheter was left to gravity bag drainage.  Attention was next turned to the right flank. The right flank was interrogated with ultrasound. The right kidney is markedly hydronephrotic. No significant perinephric fluid. A suitable access site on the skin overlying the lower pole, posterior calix was identified. After local anesthesia was achieved, a small skin  nick was made with an 11 blade scalpel. A 21 gauge Accustick needle was then advanced under direct sonographic guidance into the lower pole of the right kidney. A 0.018 inch wire was advanced under fluoroscopic guidance into the left renal collecting system. The Accustick sheath was then advanced over the wire and a 0.018 system exchanged for a 0.035 system. Gentle hand injection of contrast material confirms placement of the sheath within the renal collecting system. An antegrade nephrostogram was performed confirming severe hydroureteronephrosis. There is complete obstruction of the distal ureter at the level of the mid sacral ala. The tract from the scan into the renal collecting system  was then dilated serially to 10-French. A 10-French Cook all-purpose drain was then placed and positioned under fluoroscopic guidance. The locking loop is well formed within the left renal pelvis. The catheter was secured to the skin with 2-0 Prolene and a sterile bandage was placed. Catheter was left to gravity bag drainage.  COMPLICATIONS: None immediate.  IMPRESSION: 1. Moderate left hydronephrosis with evidence of forniceal rupture resulting in moderate perinephric fluid. 2. Severe right hydroureteronephrosis. 3. Successful placement of bilateral 10 French percutaneous nephrostomy tubes. 4. Bilateral percutaneous nephrostograms demonstrate complete obstruction of both ureters at the level of the mid sacrum consistent with malignant obstructive uropathy.  PLAN: Maintained tubes to gravity drainage and follow creatinine. Hematuria is expected to clear over 24-48 hr. Given presumed local recurrence of colon carcinoma in the setting of active lung adenocarcinoma, these tubes will likely be present for the remainder of the patient's life. Once they have healed (4-6 weeks), an effort can be made to cross the obstructions for conversion to percutaneous nephroureteral stents, or double-J ureteral stents if internalization is desired.  Signed,  Criselda Peaches, MD  [facility]  [facility] Radiology   Electronically Signed   By: Jacqulynn Cadet M.D.   On: 04/28/2014 08:01   Ir US Guide Bx Asp/drain  04/28/2014   CLINICAL DATA:  73 year old male with a past history of colonic adenocarcinoma treated with neoadjuvant chemoradiation followed by resection and colostomy. 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 He was transferred to the emergency department today from Radiation Oncology for progressive left flank pain, decreased urine output, nausea and vomiting. Workup revealed enlargement of a presacral  mass resulting in severe bilateral hydronephrosis. There is evidence of forniceal rupture on the left with marked perinephric fluid. His baseline creatinine is 0.8 has markedly increased 2 7. He is in acute renal failure and requires emergent bilateral percutaneous nephrostomy tube placement for renal decompression and salvage of renal function.  EXAM: PERC NEPHROSTOMY*L*; RIGHT NEPHROSTOGRAM; PERC NEPHROSTOMY*R*; IR ULTRASOUND GUIDANCE; LEFT NEPHROSTOGRAM  Date: 04/28/2014  PROCEDURE: 1. Ultrasound-guided puncture of the left renal collecting system 2. Left antegrade percutaneous nephrostogram 3. Placement of a 10 French percutaneous nephrostomy tube under fluoroscopic guidance 4. Ultrasound guided puncture of the right renal collecting system 5. Right antegrade percutaneous nephrostogram 6. Placement of a 10 French percutaneous nephrostomy tube under fluoroscopic guidance Interventional Radiologist:  Criselda Peaches, MD  ANESTHESIA/SEDATION: Moderate (conscious) sedation was used. 2.5 mg Versed, 100 mcg Fentanyl were administered intravenously. The patient's vital signs were monitored continuously by radiology nursing throughout the procedure.  Sedation Time: 30 minutes  2g Ancef were administered intravenously within 1 hr of skin incision.  FLUOROSCOPY TIME:  4 minutes 12 seconds  CONTRAST:  41mL OMNIPAQUE IOHEXOL 300 MG/ML  SOLN  TECHNIQUE: Informed  consent was obtained from the patient following explanation of the procedure, risks, benefits and alternatives. The patient understands, agrees and consents for the procedure. All questions were addressed. A time out was performed.  Maximal barrier sterile technique utilized including caps, mask, sterile gowns, sterile gloves, large sterile drape, hand hygiene, and Betadine skin prep.  Attention was first turned to the left flank. The left flank was interrogated with ultrasound. The kidney is moderately hydronephrotic. There is marked perinephric fluid and  stranding consistent with the findings on the recent CT scan. A suitable access site on the skin overlying the lower pole, posterior calix was identified. After local anesthesia was achieved, a small skin nick was made with an 11 blade scalpel. A 21 gauge Accustick needle was then advanced under direct sonographic guidance into the lower pole of the left kidney. A 0.018 inch wire was advanced under fluoroscopic guidance into the left renal collecting system. The Accustick sheath was then advanced over the wire and a 0.018 system exchanged for a 0.035 system. Gentle hand injection of contrast material confirms placement of the sheath within the renal collecting system. An antegrade nephrostogram was performed confirming moderate hydroureteronephrosis. There is complete obstruction of the distal ureter at the level of the mid sacral ala. The tract from the scan into the renal collecting system was then dilated serially to 10-French. A 10-French Cook all-purpose drain was then placed and positioned under fluoroscopic guidance. The locking loop is well formed within the left renal pelvis. The catheter was secured to the skin with 2-0 Prolene and a sterile bandage was placed. Catheter was left to gravity bag drainage.  Attention was next turned to the right flank. The right flank was interrogated with ultrasound. The right kidney is markedly hydronephrotic. No significant perinephric fluid. A suitable access site on the skin overlying the lower pole, posterior calix was identified. After local anesthesia was achieved, a small skin nick was made with an 11 blade scalpel. A 21 gauge Accustick needle was then advanced under direct sonographic guidance into the lower pole of the right kidney. A 0.018 inch wire was advanced under fluoroscopic guidance into the left renal collecting system. The Accustick sheath was then advanced over the wire and a 0.018 system exchanged for a 0.035 system. Gentle hand injection of contrast  material confirms placement of the sheath within the renal collecting system. An antegrade nephrostogram was performed confirming severe hydroureteronephrosis. There is complete obstruction of the distal ureter at the level of the mid sacral ala. The tract from the scan into the renal collecting system was then dilated serially to 10-French. A 10-French Cook all-purpose drain was then placed and positioned under fluoroscopic guidance. The locking loop is well formed within the left renal pelvis. The catheter was secured to the skin with 2-0 Prolene and a sterile bandage was placed. Catheter was left to gravity bag drainage.  COMPLICATIONS: None immediate.  IMPRESSION: 1. Moderate left hydronephrosis with evidence of forniceal rupture resulting in moderate perinephric fluid. 2. Severe right hydroureteronephrosis. 3. Successful placement of bilateral 10 French percutaneous nephrostomy tubes. 4. Bilateral percutaneous nephrostograms demonstrate complete obstruction of both ureters at the level of the mid sacrum consistent with malignant obstructive uropathy.  PLAN: Maintained tubes to gravity drainage and follow creatinine. Hematuria is expected to clear over 24-48 hr. Given presumed local recurrence of colon carcinoma in the setting of active lung adenocarcinoma, these tubes will likely be present for the remainder of the patient's life. Once they have healed (4-6  weeks), an effort can be made to cross the obstructions for conversion to percutaneous nephroureteral stents, or double-J ureteral stents if internalization is desired.  Signed,  Criselda Peaches, MD  [facility]  [facility]   Electronically Signed   By: Jacqulynn Cadet M.D.   On: 04/28/2014 08:01        Scheduled Meds: . feeding supplement (RESOURCE BREEZE)  1 Container Oral TID BM  . heparin  5,000 Units Subcutaneous 3 times per day  . pantoprazole  40 mg Oral Daily  . sodium chloride  3 mL  Intravenous Q12H  . 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 [facility] Pager [phone]  If 7PM-7AM, please contact night-coverage [email or web address] Password [email or web address] 04/29/2014, 1:03 PM    LOS: 2 days

## 2014-04-30 DIAGNOSIS — R531 Weakness: Secondary | ICD-10-CM

## 2014-04-30 DIAGNOSIS — R5383 Other fatigue: Secondary | ICD-10-CM

## 2014-04-30 DIAGNOSIS — R5381 Other malaise: Secondary | ICD-10-CM

## 2014-04-30 DIAGNOSIS — Z515 Encounter for palliative care: Secondary | ICD-10-CM

## 2014-04-30 LAB — BASIC METABOLIC PANEL
BUN: 7 mg/dL (ref 6–23)
CHLORIDE: 102 meq/L (ref 96–112)
CO2: 28 mEq/L (ref 19–32)
Calcium: 8.2 mg/dL — ABNORMAL LOW (ref 8.4–10.5)
Creatinine, Ser: 0.8 mg/dL (ref 0.50–1.35)
GFR calc Af Amer: >90 mL/min (ref >90)
GFR calc non Af Amer: 87 mL/min — ABNORMAL LOW (ref >90)
Glucose, Bld: 98 mg/dL (ref 70–99)
Potassium: 3.2 mEq/L — ABNORMAL LOW (ref 3.7–5.3)
SODIUM: 141 meq/L (ref 137–147)

## 2014-04-30 LAB — CBC
HEMATOCRIT: 23.3 % — AB (ref 39.0–52.0)
HEMOGLOBIN: 7.8 g/dL — AB (ref 13.0–17.0)
MCH: 26.4 pg (ref 26.0–34.0)
MCHC: 33.5 g/dL (ref 30.0–36.0)
MCV: 78.7 fL (ref 78.0–100.0)
Platelets: 440 10*3/uL — ABNORMAL HIGH (ref 150–400)
RBC: 2.96 MIL/uL — ABNORMAL LOW (ref 4.22–5.81)
RDW: 19.9 % — ABNORMAL HIGH (ref 11.5–15.5)
WBC: 9.4 10*3/uL (ref 4.0–10.5)

## 2014-04-30 LAB — MAGNESIUM: Magnesium: 1.4 mg/dL — ABNORMAL LOW (ref 1.5–2.5)

## 2014-04-30 MED ORDER — POTASSIUM CHLORIDE CRYS ER 20 MEQ PO TBCR
30.0000 meq | EXTENDED_RELEASE_TABLET | ORAL | Status: AC
  Administered 2014-04-30 (×2): 30 meq via ORAL
  Filled 2014-04-30 (×2): qty 1

## 2014-04-30 MED ORDER — BOOST / RESOURCE BREEZE PO LIQD: 1.0000 | Freq: Three times a day (TID) | ORAL | Status: AC

## 2014-04-30 MED ORDER — MAGNESIUM SULFATE 4000MG/100ML IJ SOLN
4.0000 g | Freq: Once | INTRAMUSCULAR | Status: AC
  Administered 2014-04-30: 4 g via INTRAVENOUS
  Filled 2014-04-30: qty 100

## 2014-04-30 NOTE — Care Management Note (Addendum)
CARE MANAGEMENT NOTE 04/30/2014  Patient:  Randy Gould, Randy Gould   Account Number:  [id number]  Date Initiated:  04/30/2014  Documentation initiated by:  Jasmine Pang  Subjective/Objective Assessment:   Referral to Hospice     Action/Plan:   Met with pt, re d/c plans pt requesting home Hospice services and [facility]. [facility] notified and info faxed. They will contact pt tomorrow 05/01/2014   Anticipated DC Date:  04/30/2014   Anticipated DC Plan:  HOME W HOSPICE CARE         [facility] Choice  HOSPICE   Choice offered to / List presented to:  C-1 Patient           Status of service:  Completed, signed off Medicare Important Message given?  Yes (If response is "NO", the following Medicare IM given date fields will be blank) Date Medicare IM given:  04/30/2014 Date Additional Medicare IM given:    Discharge Disposition:  [place]  Per UR Regulation:    If discussed at Long Length of Stay Meetings, dates discussed:    Comments:  04/30/2014 Met with pt and discussed d/c needs, Yolanda Bonine will provide transportation home. Pt states that he has no DME needs. Pt RN, Jonni Sanger will teach pt to empty nephro drainage bags. Spoke with on call hospice RN and they will contact pt at home tomorrow 05/01/2014 to begin [facility] services. Jasmine Pang RN MPH [phone]

## 2014-04-30 NOTE — Consult Note (Signed)
Patient Randy Gould      DOB: March 27, 1941      EXB:[id number]     Consult Note from the Palliative Medicine Team at [facility] Requested by: Dr Exie Parody     PCP: [name], MD Reason for Consultation: Clarification of [place] and options     Phone Number:[phone]  Assessment of patients Current state:  Continued physical, functional decline secondary to cancer diagnosis.  Patient is faced with advanced directive decisions and anticipatory care needs.  Consult is for review of medical treatment options, clarification of goals of care and end of life issues, disposition and options, and symptom recommendation.  This NP Wadie Lessen reviewed medical records, received report from team, assessed the patient and then meet at the patient's bedside along with his wife by telephone  to discuss diagnosis prognosis, [place], EOL wishes disposition and options.  A detailed discussion was had today regarding advanced directives.  Concepts specific to code status, artifical feeding and hydration, continued IV antibiotics and rehospitalization was had.  The difference between a aggressive medical intervention path  and a palliative comfort care path for this patient at this time was had.  Values and goals of care important to patient and family were attempted to be elicited.  Concept of Hospice and Palliative Care were discussed  Natural trajectory and expectations at EOL were discussed.  Questions and concerns addressed.  . Family encouraged to call with questions or concerns.  PMT will continue to support holistically.   Goals of Care: 1.  Code Status:  DNR/DNI   2. Symptom Management:   1. Weakness: continue to treat the treatable 2. Pain: Vicodin 5-324 mg PO 1-2 tablets every 4 hrs prn   3. Disposition:  Patient adamantly states he "Wants to go home today", his wife tells me they have the assistance of son and grand-son and are open to hospice services in the home, will  write for choice.  4. Psychosocial:  Emotional support offered to patient at bedside, he verbalizes his understanding of limted prognosis.  "I know I'm dying and I want to spend my last days with my wife of 83 years"   Brief HPI: 73 year old male with history of T3, N0, M0 adenocarcinoma of the right upper lung s/p preoperative chemoradiotherapy, then deemed medically unresectable, prior history of colon cancer status post colostomy, seen by Dr. [name], radiation oncology on 5/22 for simulation and treatment at which time patient complained of left flank pain 10/10. 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  Creatinine was in the 7 range. Interventional radiology was consulted and recommended transfer to [facility]. Patient underwent emergent bilateral percutaneous nephrostomies on 5/22. Dr. [name] recommends palliative care and ID consultation.     ROS: weakness, abdominal pain controlled with  present medications   PMH:  Past Medical History  Diagnosis Date  . Emphysema/COPD   . Rectal pain   . Colostomy in place   . Proctitis   . Hypotonic bladder   . Incomplete emptying of bladder   . History of malignant neoplasm of rectum     1996-- (T3 N1)   S/P  RESECTION WITH COLOSTOMY/   CHEMORADATION THERAPY  . Pelvic mass   . History of radiation therapy     RIGHT UPPER LUNG LOBE--  01-30-2014  to  03-07-2014  (45 gray)  . GERD (gastroesophageal reflux disease)   . Wears glasses   . Wears dentures   . Adenocarcinoma of lung     RIGHT UPPER LOBE (stage IIB , T2  N0, M0)--  DX JAN 2015  S/P  RADIATION TX  (COMPLETE  03-07-2014)     PSH: Past Surgical History  Procedure Laterality Date  . Nasal septum surgery  1972  .  Exploratory laparotomy /  lysis adhesions  AUG  1997    CHRONIC BOWEL OBSTRUCTION  . Small bowel resection and repair colostomy  JAN  2000  . Colonoscopy w/ polypectomy  03-16-2014  . Low anterior resection with primary anastomosis/  trnasverse loop colostomy with division  11-10-1995  . Examination under anesthesia N/A 04/09/2014    Procedure: EXAM UNDER ANESTHESIA, FLEX SIGMOIDOSCOPYM POSSIBLE BIOPSY;  Surgeon: Leighton Ruff, MD;  Location: [place];  Service: General;  Laterality: N/A;   I have reviewed the [place] and SH and  If appropriate update it with new information. Allergies  Allergen Reactions  . Ativan [Lorazepam] Other (See Comments)    REACTION: Increase heart rate  . Other Itching and Rash    States "head to toe" itching from a "powerful antibiotic" administered years ago.  Cannot recall name of antibiotic.   Scheduled Meds: . feeding supplement (RESOURCE BREEZE)  1 Container Oral TID BM  . heparin  5,000 Units Subcutaneous 3 times per day  . magnesium sulfate 1 - 4 g bolus IVPB  4 g Intravenous Once  . pantoprazole  40 mg Oral Daily  . potassium chloride  30 mEq Oral Q2H  . sodium chloride  3 mL Intravenous Q12H  . sucralfate  1 g Rectal TID   Continuous Infusions:  PRN Meds:.HYDROcodone-acetaminophen, ondansetron (ZOFRAN) IV, ondansetron    BP 129/78  Pulse 95  Temp(Src) 98 F (36.7 C) (Oral)  Resp 12  Ht 5' 9.5" (1.765 m)  Wt 49.896 kg (110 lb)  BMI 16.02 kg/m2  SpO2 95%   PPS:40 %   Intake/Output Summary (Last 24 hours) at 04/30/14 1041 Last data filed at 04/30/14 0700  Gross per 24 hour  Intake    960 ml  Output   1720 ml  Net   -760 ml    Physical Exam:  General: Chronically ill appearing, NAD HEENT:  Mm, no exudate poor dentation Chest:   CTA CVS: RRR Abdomen: mild tenderness on exam, noted nephrostomy tubes for blood tinged urine Ext: without edema Neuro:alert and oriented X3  Labs: CBC    Component Value Date/Time    WBC 9.4 04/30/2014 0600   RBC 2.96* 04/30/2014 0600   HGB 7.8* 04/30/2014 0600   HCT 23.3* 04/30/2014 0600   PLT 440* 04/30/2014 0600   MCV 78.7 04/30/2014 0600   MCH 26.4 04/30/2014 0600   MCHC 33.5 04/30/2014 0600   RDW 19.9* 04/30/2014 0600   LYMPHSABS 1.3 04/27/2014 1710   MONOABS 0.5 04/27/2014 1710   EOSABS 0.1 04/27/2014 1710   BASOSABS 0.0 04/27/2014 1710    BMET    Component Value Date/Time   NA 141 04/30/2014 0600   K 3.2* 04/30/2014 0600   CL 102 04/30/2014 0600   CO2 28 04/30/2014 0600   GLUCOSE 98 04/30/2014 0600   BUN 7 04/30/2014 0600   CREATININE 0.80 04/30/2014 0600   CREATININE 0.81 03/20/2014 1440   CALCIUM 8.2* 04/30/2014 0600   GFRNONAA 87* 04/30/2014 0600   GFRAA >90  04/30/2014 0600    CMP     Component Value Date/Time   NA 141 04/30/2014 0600   K 3.2* 04/30/2014 0600   CL 102 04/30/2014 0600   CO2 28 04/30/2014 0600   GLUCOSE 98 04/30/2014 0600   BUN 7 04/30/2014 0600   CREATININE 0.80 04/30/2014 0600   CREATININE 0.81 03/20/2014 1440   CALCIUM 8.2* 04/30/2014 0600   PROT 7.2 04/13/2014 1200   ALBUMIN 2.2* 04/13/2014 1200   AST 15 04/13/2014 1200   ALT 9 04/13/2014 1200   ALKPHOS 73 04/13/2014 1200   BILITOT <0.2* 04/13/2014 1200   GFRNONAA 87* 04/30/2014 0600   GFRAA >90 04/30/2014 0600     Time In Time Out Total Time Spent with Patient Total Overall Time  0900 1015 70 min 75 min    Greater than 50%  of this time was spent counseling and coordinating care related to the above assessment and plan.   Wadie Lessen NP  Palliative Medicine Team Team Phone # [phone] Pager [phone]  Discussed with Dr Exie Parody

## 2014-04-30 NOTE — Discharge Summary (Signed)
Physician Discharge Summary  Randy Gould XVQ:[id number] DOB: 1940/12/12 DOA: 04/27/2014  PCP: [name], MD  Admit date: 04/27/2014 Discharge date: 04/30/2014  Time spent: Less than 30 minutes  Recommendations for Outpatient Follow-up:  1. [facility] will follow patient from 5/26. 2. Dr. [name], PCP in 2 days with repeat labs (CBC & BMP). Please follow final blood Cx results. 3. Dr. Tyler Pita, radiation oncology: Patient or family to call on 05/01/14 to discuss further evaluation and management of pelvic mass. 4. Dr. Jacqulynn Cadet, interventional radiology: Regarding followup of bilateral nephrostomy tubes.  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  Filed Weights   04/27/14 2112 04/28/14 2125 04/29/14 1915  Weight: 54.613 kg (120 lb 6.4 oz) 52.844 kg (116 lb 8 oz) 49.896 kg (110 lb)    History of present illness:  73 year old male with history of T3, N0, M0 adenocarcinoma of the right upper lung s/p preoperative chemoradiotherapy, then deemed medically unresectable, prior history of colon cancer status post colostomy, seen by Dr. [name], radiation oncology on 5/22 for simulation and treatment at which time patient complained of left flank pain 10/10. 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 Patient was advised to come to [facility] long hospital ED. Creatinine was in the 7  range. Interventional radiology was consulted and recommended transfer to [facility]. Patient underwent emergent bilateral percutaneous nephrostomies on 5/22. Dr. [name] recommends palliative care.   Hospital Course:   1. Acute renal failure secondary to bilateral hydronephrosis from enlarging malignant pelvic mass: Patient underwent emergent bilateral percutaneous nephrostomy tube placement by IR. Acute renal failure has resolved. DC IVF. Will need close outpatient followup of BMP in next day or 2 and followup with interventional radiology regarding management of nephrostomy tubes. 2. Obstructive uropathy/bilateral hydronephrosis: Management as above. 3. Pelvic mass/? Local recurrence of colorectal cancer: MRI shows a large necrotic mass or complex fluid collection in the presacral space, extending through the sciatic notch-since this has been present for at least 4 months, necrotic neoplasm is favored (potentially superinfected). Suspected direct extension of tumor or osteomyelitis within the adjacent right sacrum. Patient did not look septic or toxic and did not have fever or leukocytosis and no antibiotics were initiated. Recently had rectal pain and underwent flexible sigmoidoscopy on 04/09/14 which showed fistula leading radiation proctitis-was on antibiotics. Discussed with surgeon on call on 5/23: ? Patient case probably discussed at multidisciplinary Oncology conference. OP FU with oncology and Mx as deemed necessary. Advised patient to followup with Dr. [name] on 5/26 regarding further evaluation and management of same. 4. Anemia: Secondary to chronic disease. Stable. 5. Hypokalemia/Hypomagnesemia: Secondary to improvement in acute renal failure. Aggressively replaced. Followup BMP in a day or 2 as outpatient. 6. T3, N0, M0 adenocarcinoma of left upper lung: Outpatient followup with radiation oncology. 7. Severe malnutrition in the context of chronic illness: Management per dietitian  consultation. 8. History of COPD: Stable 9. Status post colostomy: 10. DO NOT RESUSCITATE:  Palliative care consulted and patient opted for DNR and agreed to home hospice.   Consultations:  Interventional radiology  Palliative care medicine  Procedures:  Bilateral percutaneous nephrostomies    Discharge Exam:  Complaints: Denies any complaints. Insisting on going home and per patient- has family who can assist him at home. He is concerned regarding his wife who fell and is unwell.  Filed Vitals:   04/29/14 0920 04/29/14 1915 04/30/14 0523 04/30/14 1106  BP: 161/69 155/64 129/78 157/68  Pulse: 78 77 95 80  Temp: 97.8 F (36.6 C) 98.4 F (36.9 C) 98 F (36.7 C) 98.3 F (36.8 C)  TempSrc: Oral Oral Oral Oral  Resp: 16 16 12 16   Height:  5' 9.5" (1.765 m)    Weight:  49.896 kg (110 lb)    SpO2: 97% 96% 95% 95%    General exam: Elderly frail male lying comfortably in bed.  Respiratory system: Clear. No increased work of breathing.  Cardiovascular system: S1 & S2 heard, RRR. No JVD, murmurs, gallops, clicks or pedal edema.  Gastrointestinal system: Abdomen is nondistended, soft and nontender. Normal bowel sounds heard. Colostomy in place with soft green stools. Bilateral percutaneous nephrostomy draining straw-colored urine.  Central nervous system: Alert and oriented. No focal neurological deficits.  Extremities: Symmetric 5 x 5 power.   Discharge Instructions      Discharge Instructions   Call MD for:  extreme fatigue    Complete by:  As directed      Call MD for:  persistant dizziness or light-headedness    Complete by:  As directed      Call MD for:  persistant nausea and vomiting    Complete by:  As directed      Call MD for:  redness, tenderness, or signs of infection (pain, swelling, redness, odor or green/yellow discharge around incision site)    Complete by:  As directed      Call MD for:  severe uncontrolled pain    Complete by:  As directed      Call  MD for:  temperature >100.4    Complete by:  As directed      Diet - low sodium heart healthy    Complete by:  As directed      Increase activity slowly    Complete by:  As directed             Medication List         acetaminophen 650 MG CR tablet  Commonly known as:  TYLENOL  Take 650 mg by mouth every 4 (four) hours as needed for pain.     esomeprazole 20 MG capsule  Commonly known as:  NEXIUM  Take 20 mg by mouth daily as needed (for acid reflex).     feeding supplement (RESOURCE BREEZE) Liqd  Take 1 Container by mouth 3 (three) times daily between meals.     oxyCODONE 5 MG immediate release tablet  Commonly known as:  Oxy IR/ROXICODONE  Take 1-2 tablets (5-10 mg total) by mouth every 4 (four) hours as needed for severe pain.     prochlorperazine 10 MG tablet  Commonly known as:  COMPAZINE  Take 10 mg by mouth every 6 (six) hours as needed for nausea or vomiting.     sucralfate 1 GM/10ML suspension  Commonly known as:  CARAFATE  Place 10 mLs (1 g total) rectally 3 (three) times daily.       Follow-up Information   Please follow up. (  [facility]  [phone] call for questions )       Follow up with [name], MD. Schedule an appointment as soon as possible for a visit in 2 days. (To be seen with repeat labs (CBC & BMP).)    Specialty:  Family Medicine   Contact information:   [address] [place] [address] [phone]       Follow up with Tyler Pita A, MD. Schedule an appointment as soon as possible for a visit in 1 day. (Call office on 05/01/2014 morning to further discuss your care including the pelvic mass evaluation and treatment.)    Specialty:  Radiation Oncology   Contact information:   [place] [address] [phone]       Schedule an appointment as soon as possible for a visit with [facility], Antonietta Jewel, MD. (To follow up regarding the tubes you have in both your  kidneys.)    Specialty:  Interventional Radiology   Contact information:   [place] [id number]       The results of significant diagnostics from this hospitalization (including imaging, microbiology, ancillary and laboratory) are listed below for reference.    Significant Diagnostic Studies: Dg Chest 2 View  04/19/2014   CLINICAL DATA:  Adenocarcinoma of the right upper lobe, followup  EXAM: CHEST  2 VIEW  COMPARISON:  CT chest of 03/05/2011, and chest x-ray of 04/13/2014  FINDINGS: The peripheral right upper lobe lesion again is noted with adjacent pleural thickening. There does appear to be destruction of the lateral right second and third ribs presumably by tumor invasion. This bone destruction is new when compared to the prior CT. The lungs remain very hyperaerated consistent with severe emphysema. No infiltrate or effusion is seen. Mediastinal contours appear stable and the heart is within normal limits in size. The thoracic vertebrae remain unchanged in alignment.  IMPRESSION: 1. No change in peripheral soft tissue mass within the right upper lobe with adjacent bone destruction of the right lateral second and third ribs. 2. Severe emphysema.   Electronically Signed   By: Ivar Drape M.D.   On: 04/19/2014 09:52   Mr Pelvis Wo Contrast  04/28/2014   CLINICAL DATA:  Enlarging pelvic mass causing hydronephrosis on recent CT. Remote history of colon cancer. Currently being treated for lung cancer. 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 Recurrent rectal cancer unlikely 20 years after his treatment, but in the differential diagnosis.  EXAM: MRI PELVIS WITHOUT CONTRAST  TECHNIQUE: Multiplanar multisequence MR imaging of the pelvis was performed. No intravenous contrast was administered.  COMPARISON:  Abdominal pelvic CT 04/27/2014.  PET-CT 12/21/2013.  FINDINGS: Examination is limited by motion  and lack of contrast.  As seen on recent CT, there is a large complex mass or fluid collection in the presacral space, extending off midline to the right through the sciatic foramen. This measures approximately 5.3 x 8.7 cm transverse and 6.7 cm cephalocaudad. This process of buttocks and erodes the anterior cortex of the sacrum. There is diffuse replacement of the normal marrow signal throughout the right aspect of the sacrum. The sacroiliac joints appear normal. The remainder of the bony pelvis appears normal.  As correlated with the CT, this presacral process is intimately associated with and indistinguishable from the rectum. Patient is status post descending colostomy. Ureterectasis appears improved following recent bilateral percutaneous nephrostomy. The urinary  bladder is mildly distended. There is generalized soft tissue edema throughout the pelvis and lower abdomen. Small bilateral hip joint effusions are noted.  IMPRESSION: 1. Again demonstrated is a large necrotic mass or complex fluid collection in the presacral space, extending through the sciatic notch. Since this has been present for at least 4 months, necrotic neoplasm is favored (potentially superinfected). 2. Adjacent cortical destruction and abnormal signal within the right aspect of the sacrum suspicious for either direct extension of tumor or osteomyelitis. 3. Improved bilateral ureterectasis status post bilateral ureteral stent placement. 4. The presacral process is amenable to percutaneous transgluteal drainage/biopsy.   Electronically Signed   By: Camie Patience M.D.   On: 04/28/2014 11:10   Ct Abdomen Pelvis W Contrast  04/27/2014   CLINICAL DATA:  Left flank pain.  History of colon cancer.  EXAM: CT ABDOMEN AND PELVIS WITH CONTRAST  TECHNIQUE: Multidetector CT imaging of the abdomen and pelvis was performed using the standard protocol following bolus administration of intravenous contrast.  CONTRAST:  126mL OMNIPAQUE IOHEXOL 300 MG/ML   SOLN  COMPARISON:  CT abdomen pelvis 03/21/2014.  FINDINGS: Visualization of the lower thorax demonstrates new small left pleural effusion. Normal heart size.  Unchanged multiple sub cm low-attenuation lesions within the liver. Focal fatty deposition adjacent to the falciform ligament. Hepatic and portal veins are patent. Gallbladder is unremarkable. Spleen, pancreas and bilateral adrenal glands are unremarkable.  Patient status post low anterior resection. 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 Adjacent to the resection site within the low anatomic pelvis is a complex soft tissue, gas and fluid containing mass located anterior to and adjacent to the sacrum measuring 8.2 x 5.7 cm, slightly increased when compared to prior examination. Interval increase in now moderate amount of ascites.  There is new severe bilateral hydroureteronephrosis with bilateral delayed nephrograms, worse within the right kidney with extensive left-greater-than-right perinephric fluid and fat stranding. Delayed phase images demonstrate excretion of contrast within the left renal collecting system. No excretion of contrast is demonstrated within the right renal collecting system. This new marked hydroureteronephrosis likely secondary to the worsening above described pelvic soft tissue, fluid and gas collection. The urinary bladder is distended.  Re- demonstrated nonspecific 1.1 cm enhancing mass along the posterior left lateral aspect of the urinary bladder. Central dystrophic calcifications within the prostate. Oral contrast material is demonstrated throughout the small bowel. No definite evidence for bowel obstruction.  No definite aggressive or acute appearing osseous lesions. Mild mottled appearance of the sacrum. Bilateral L5 pars defects.  IMPRESSION: 1. Interval worsening of large irregular soft tissue mass fluid/gas collection within the presacral space and involving  parasacral musculature. Findings may be secondary to an inflammatory/infectious mass however given the history of malignancy, local recurrence is a consideration. 2. There is now involvement of the distal aspect of the bilateral ureters by the large soft tissue mass within the presacral region causing severe bilateral hydronephrosis, right-greater-than-left with extensive perinephric fat stranding as well as delayed nephrograms. No excretion from the right kidney with minimal excretion from the left kidney compatible with high-grade obstruction. 3. 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 4. Interval increase in moderate ascites and anasarca. 5. New small left pleural effusion. 6. Nonspecific enhancing mass along the posterior lateral aspect of the bladder wall potentially secondary to infectious or inflammatory process, ureterocele or metastatic lesion. 7. Mild mottled appearance of the sacrum. Osteomyelitis not excluded. These results were called by telephone  at the time of interpretation on 04/27/2014 at 3:33 PM to Dr. Tyler Pita , who verbally acknowledged these results.   Electronically Signed   By: Lovey Newcomer M.D.   On: 04/27/2014 15:53   Ir Nephrostogram Left  04/28/2014   CLINICAL DATA:  73 year old male with a past history of colonic adenocarcinoma treated with neoadjuvant chemoradiation followed by resection and colostomy. 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 He was transferred to the emergency department today from Radiation Oncology for progressive left flank pain, decreased urine output, nausea and vomiting. 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 There is evidence of forniceal rupture on the left with marked perinephric fluid. His baseline creatinine is 0.8 has markedly increased 2 7. He is in acute renal failure and requires  emergent bilateral percutaneous nephrostomy tube placement for renal decompression and salvage of renal function.  EXAM: PERC NEPHROSTOMY*L*; RIGHT NEPHROSTOGRAM; PERC NEPHROSTOMY*R*; IR ULTRASOUND GUIDANCE; LEFT NEPHROSTOGRAM  Date: 04/28/2014  PROCEDURE: 1. Ultrasound-guided puncture of the left renal collecting system 2. Left antegrade percutaneous nephrostogram 3. Placement of a 10 French percutaneous nephrostomy tube under fluoroscopic guidance 4. Ultrasound guided puncture of the right renal collecting system 5. Right antegrade percutaneous nephrostogram 6. Placement of a 10 French percutaneous nephrostomy tube under fluoroscopic guidance Interventional Radiologist:  Criselda Peaches, MD  ANESTHESIA/SEDATION: Moderate (conscious) sedation was used. 2.5 mg Versed, 100 mcg Fentanyl were administered intravenously. The patient's vital signs were monitored continuously by radiology nursing throughout the procedure.  Sedation Time: 30 minutes  2g Ancef were administered intravenously within 1 hr of skin incision.  FLUOROSCOPY TIME:  4 minutes 12 seconds  CONTRAST:  8mL OMNIPAQUE IOHEXOL 300 MG/ML  SOLN  TECHNIQUE: Informed consent was obtained from the patient following explanation of the procedure, risks, benefits and alternatives. The patient understands, agrees and consents for the procedure. All questions were addressed. A time out was performed.  Maximal barrier sterile technique utilized including caps, mask, sterile gowns, sterile gloves, large sterile drape, hand hygiene, and Betadine skin prep.  Attention was first turned to the left flank. The left flank was interrogated with ultrasound. The kidney is moderately hydronephrotic. There is marked perinephric fluid and stranding consistent with the findings on the recent CT scan. A suitable access site on the skin overlying the lower pole, posterior calix was identified. After local anesthesia was achieved, a small skin nick was made with an 11 blade  scalpel. A 21 gauge Accustick needle was then advanced under direct sonographic guidance into the lower pole of the left kidney. A 0.018 inch wire was advanced under fluoroscopic guidance into the left renal collecting system. The Accustick sheath was then advanced over the wire and a 0.018 system exchanged for a 0.035 system. Gentle hand injection of contrast material confirms placement of the sheath within the renal collecting system. An antegrade nephrostogram was performed confirming moderate hydroureteronephrosis. There is complete obstruction of the distal ureter at the level of the mid sacral ala. The tract from the scan into the renal collecting system was then dilated serially to 10-French. A 10-French Cook all-purpose drain was then placed and positioned under fluoroscopic guidance. The locking loop is well formed within the left renal pelvis. The catheter was secured to the skin with 2-0 Prolene and a sterile bandage was placed. Catheter was left to gravity bag drainage.  Attention was next turned to the right flank. The right flank was  interrogated with ultrasound. The right kidney is markedly hydronephrotic. No significant perinephric fluid. A suitable access site on the skin overlying the lower pole, posterior calix was identified. After local anesthesia was achieved, a small skin nick was made with an 11 blade scalpel. A 21 gauge Accustick needle was then advanced under direct sonographic guidance into the lower pole of the right kidney. A 0.018 inch wire was advanced under fluoroscopic guidance into the left renal collecting system. The Accustick sheath was then advanced over the wire and a 0.018 system exchanged for a 0.035 system. Gentle hand injection of contrast material confirms placement of the sheath within the renal collecting system. An antegrade nephrostogram was performed confirming severe hydroureteronephrosis. There is complete obstruction of the distal ureter at the level of the mid  sacral ala. The tract from the scan into the renal collecting system was then dilated serially to 10-French. A 10-French Cook all-purpose drain was then placed and positioned under fluoroscopic guidance. The locking loop is well formed within the left renal pelvis. The catheter was secured to the skin with 2-0 Prolene and a sterile bandage was placed. Catheter was left to gravity bag drainage.  COMPLICATIONS: None immediate.  IMPRESSION: 1. Moderate left hydronephrosis with evidence of forniceal rupture resulting in moderate perinephric fluid. 2. Severe right hydroureteronephrosis. 3. Successful placement of bilateral 10 French percutaneous nephrostomy tubes. 4. Bilateral percutaneous nephrostograms demonstrate complete obstruction of both ureters at the level of the mid sacrum consistent with malignant obstructive uropathy.  PLAN: Maintained tubes to gravity drainage and follow creatinine. Hematuria is expected to clear over 24-48 hr. Given presumed local recurrence of colon carcinoma in the setting of active lung adenocarcinoma, these tubes will likely be present for the remainder of the patient's life. Once they have healed (4-6 weeks), an effort can be made to cross the obstructions for conversion to percutaneous nephroureteral stents, or double-J ureteral stents if internalization is desired.  Signed,  Criselda Peaches, MD  [facility]  [facility]   Electronically Signed   By: Jacqulynn Cadet M.D.   On: 04/28/2014 08:01   Ir Nephrostogram Right  04/28/2014   CLINICAL DATA:  73 year old male with a past history of colonic adenocarcinoma treated with neoadjuvant chemoradiation followed by resection and colostomy. 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 He was transferred to the emergency department today from Radiation Oncology for progressive left flank pain, decreased urine  output, nausea and vomiting. 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 There is evidence of forniceal rupture on the left with marked perinephric fluid. His baseline creatinine is 0.8 has markedly increased 2 7. He is in acute renal failure and requires emergent bilateral percutaneous nephrostomy tube placement for renal decompression and salvage of renal function.  EXAM: PERC NEPHROSTOMY*L*; RIGHT NEPHROSTOGRAM; PERC NEPHROSTOMY*R*; IR ULTRASOUND GUIDANCE; LEFT NEPHROSTOGRAM  Date: 04/28/2014  PROCEDURE: 1. Ultrasound-guided puncture of the left renal collecting system 2. Left antegrade percutaneous nephrostogram 3. Placement of a 10 French percutaneous nephrostomy tube under fluoroscopic guidance 4. Ultrasound guided puncture of the right renal collecting system 5. Right antegrade percutaneous nephrostogram 6. Placement of a 10 French percutaneous nephrostomy tube under fluoroscopic guidance Interventional Radiologist:  Criselda Peaches, MD  ANESTHESIA/SEDATION: Moderate (conscious) sedation was used. 2.5 mg Versed, 100 mcg Fentanyl were administered intravenously. The patient's vital signs were monitored continuously by radiology nursing throughout the procedure.  Sedation Time: 30  minutes  2g Ancef were administered intravenously within 1 hr of skin incision.  FLUOROSCOPY TIME:  4 minutes 12 seconds  CONTRAST:  44mL OMNIPAQUE IOHEXOL 300 MG/ML  SOLN  TECHNIQUE: Informed consent was obtained from the patient following explanation of the procedure, risks, benefits and alternatives. The patient understands, agrees and consents for the procedure. All questions were addressed. A time out was performed.  Maximal barrier sterile technique utilized including caps, mask, sterile gowns, sterile gloves, large sterile drape, hand hygiene, and Betadine skin prep.  Attention was first turned to the left flank. The left flank was interrogated with ultrasound. The kidney is  moderately hydronephrotic. There is marked perinephric fluid and stranding consistent with the findings on the recent CT scan. A suitable access site on the skin overlying the lower pole, posterior calix was identified. After local anesthesia was achieved, a small skin nick was made with an 11 blade scalpel. A 21 gauge Accustick needle was then advanced under direct sonographic guidance into the lower pole of the left kidney. A 0.018 inch wire was advanced under fluoroscopic guidance into the left renal collecting system. The Accustick sheath was then advanced over the wire and a 0.018 system exchanged for a 0.035 system. Gentle hand injection of contrast material confirms placement of the sheath within the renal collecting system. An antegrade nephrostogram was performed confirming moderate hydroureteronephrosis. There is complete obstruction of the distal ureter at the level of the mid sacral ala. The tract from the scan into the renal collecting system was then dilated serially to 10-French. A 10-French Cook all-purpose drain was then placed and positioned under fluoroscopic guidance. The locking loop is well formed within the left renal pelvis. The catheter was secured to the skin with 2-0 Prolene and a sterile bandage was placed. Catheter was left to gravity bag drainage.  Attention was next turned to the right flank. The right flank was interrogated with ultrasound. The right kidney is markedly hydronephrotic. No significant perinephric fluid. A suitable access site on the skin overlying the lower pole, posterior calix was identified. After local anesthesia was achieved, a small skin nick was made with an 11 blade scalpel. A 21 gauge Accustick needle was then advanced under direct sonographic guidance into the lower pole of the right kidney. A 0.018 inch wire was advanced under fluoroscopic guidance into the left renal collecting system. The Accustick sheath was then advanced over the wire and a 0.018 system  exchanged for a 0.035 system. Gentle hand injection of contrast material confirms placement of the sheath within the renal collecting system. An antegrade nephrostogram was performed confirming severe hydroureteronephrosis. There is complete obstruction of the distal ureter at the level of the mid sacral ala. The tract from the scan into the renal collecting system was then dilated serially to 10-French. A 10-French Cook all-purpose drain was then placed and positioned under fluoroscopic guidance. The locking loop is well formed within the left renal pelvis. The catheter was secured to the skin with 2-0 Prolene and a sterile bandage was placed. Catheter was left to gravity bag drainage.  COMPLICATIONS: None immediate.  IMPRESSION: 1. Moderate left hydronephrosis with evidence of forniceal rupture resulting in moderate perinephric fluid. 2. Severe right hydroureteronephrosis. 3. Successful placement of bilateral 10 French percutaneous nephrostomy tubes. 4. Bilateral percutaneous nephrostograms demonstrate complete obstruction of both ureters at the level of the mid sacrum consistent with malignant obstructive uropathy.  PLAN: Maintained tubes to gravity drainage and follow creatinine. Hematuria is expected to clear over  24-48 hr. Given presumed local recurrence of colon carcinoma in the setting of active lung adenocarcinoma, these tubes will likely be present for the remainder of the patient's life. Once they have healed (4-6 weeks), an effort can be made to cross the obstructions for conversion to percutaneous nephroureteral stents, or double-J ureteral stents if internalization is desired.  Signed,  Criselda Peaches, MD  [facility]  [facility]   Electronically Signed   By: Jacqulynn Cadet M.D.   On: 04/28/2014 08:01   Ir Perc Nephrostomy Left  04/28/2014   CLINICAL DATA:  73 year old male with a past history of colonic adenocarcinoma treated with neoadjuvant  chemoradiation followed by resection and colostomy. 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 He was transferred to the emergency department today from Radiation Oncology for progressive left flank pain, decreased urine output, nausea and vomiting. 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 There is evidence of forniceal rupture on the left with marked perinephric fluid. His baseline creatinine is 0.8 has markedly increased 2 7. He is in acute renal failure and requires emergent bilateral percutaneous nephrostomy tube placement for renal decompression and salvage of renal function.  EXAM: PERC NEPHROSTOMY*L*; RIGHT NEPHROSTOGRAM; PERC NEPHROSTOMY*R*; IR ULTRASOUND GUIDANCE; LEFT NEPHROSTOGRAM  Date: 04/28/2014  PROCEDURE: 1. Ultrasound-guided puncture of the left renal collecting system 2. Left antegrade percutaneous nephrostogram 3. Placement of a 10 French percutaneous nephrostomy tube under fluoroscopic guidance 4. Ultrasound guided puncture of the right renal collecting system 5. Right antegrade percutaneous nephrostogram 6. Placement of a 10 French percutaneous nephrostomy tube under fluoroscopic guidance Interventional Radiologist:  Criselda Peaches, MD  ANESTHESIA/SEDATION: Moderate (conscious) sedation was used. 2.5 mg Versed, 100 mcg Fentanyl were administered intravenously. The patient's vital signs were monitored continuously by radiology nursing throughout the procedure.  Sedation Time: 30 minutes  2g Ancef were administered intravenously within 1 hr of skin incision.  FLUOROSCOPY TIME:  4 minutes 12 seconds  CONTRAST:  92mL OMNIPAQUE IOHEXOL 300 MG/ML  SOLN  TECHNIQUE: Informed consent was obtained from the patient following explanation of the procedure, risks, benefits and alternatives. The patient understands, agrees and consents for the procedure. All questions were  addressed. A time out was performed.  Maximal barrier sterile technique utilized including caps, mask, sterile gowns, sterile gloves, large sterile drape, hand hygiene, and Betadine skin prep.  Attention was first turned to the left flank. The left flank was interrogated with ultrasound. The kidney is moderately hydronephrotic. There is marked perinephric fluid and stranding consistent with the findings on the recent CT scan. A suitable access site on the skin overlying the lower pole, posterior calix was identified. After local anesthesia was achieved, a small skin nick was made with an 11 blade scalpel. A 21 gauge Accustick needle was then advanced under direct sonographic guidance into the lower pole of the left kidney. A 0.018 inch wire was advanced under fluoroscopic guidance into the left renal collecting system. The Accustick sheath was then advanced over the wire and a 0.018 system exchanged for a 0.035 system. Gentle hand injection of contrast material confirms placement of the sheath within the renal collecting system. An antegrade nephrostogram was performed confirming moderate hydroureteronephrosis. There is complete obstruction of the distal ureter at the level of the mid sacral ala. The tract from the scan into the renal collecting system was then dilated serially to 10-French. A 10-French Cook all-purpose drain  was then placed and positioned under fluoroscopic guidance. The locking loop is well formed within the left renal pelvis. The catheter was secured to the skin with 2-0 Prolene and a sterile bandage was placed. Catheter was left to gravity bag drainage.  Attention was next turned to the right flank. The right flank was interrogated with ultrasound. The right kidney is markedly hydronephrotic. No significant perinephric fluid. A suitable access site on the skin overlying the lower pole, posterior calix was identified. After local anesthesia was achieved, a small skin nick was made with an 11  blade scalpel. A 21 gauge Accustick needle was then advanced under direct sonographic guidance into the lower pole of the right kidney. A 0.018 inch wire was advanced under fluoroscopic guidance into the left renal collecting system. The Accustick sheath was then advanced over the wire and a 0.018 system exchanged for a 0.035 system. Gentle hand injection of contrast material confirms placement of the sheath within the renal collecting system. An antegrade nephrostogram was performed confirming severe hydroureteronephrosis. There is complete obstruction of the distal ureter at the level of the mid sacral ala. The tract from the scan into the renal collecting system was then dilated serially to 10-French. A 10-French Cook all-purpose drain was then placed and positioned under fluoroscopic guidance. The locking loop is well formed within the left renal pelvis. The catheter was secured to the skin with 2-0 Prolene and a sterile bandage was placed. Catheter was left to gravity bag drainage.  COMPLICATIONS: None immediate.  IMPRESSION: 1. Moderate left hydronephrosis with evidence of forniceal rupture resulting in moderate perinephric fluid. 2. Severe right hydroureteronephrosis. 3. Successful placement of bilateral 10 French percutaneous nephrostomy tubes. 4. Bilateral percutaneous nephrostograms demonstrate complete obstruction of both ureters at the level of the mid sacrum consistent with malignant obstructive uropathy.  PLAN: Maintained tubes to gravity drainage and follow creatinine. Hematuria is expected to clear over 24-48 hr. Given presumed local recurrence of colon carcinoma in the setting of active lung adenocarcinoma, these tubes will likely be present for the remainder of the patient's life. Once they have healed (4-6 weeks), an effort can be made to cross the obstructions for conversion to percutaneous nephroureteral stents, or double-J ureteral stents if internalization is desired.  Signed,  Criselda Peaches, MD  [facility]  [facility]   Electronically Signed   By: Jacqulynn Cadet M.D.   On: 04/28/2014 08:01   Ir Perc Nephrostomy Right  04/28/2014   CLINICAL DATA:  73 year old male with a past history of colonic adenocarcinoma treated with neoadjuvant chemoradiation followed by resection and colostomy. 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 He was transferred to the emergency department today from Radiation Oncology for progressive left flank pain, decreased urine output, nausea and vomiting. 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 There is evidence of forniceal rupture on the left with marked perinephric fluid. His baseline creatinine is 0.8 has markedly increased 2 7. He is in acute renal failure and requires emergent bilateral percutaneous nephrostomy tube placement for renal decompression and salvage of renal function.  EXAM: PERC NEPHROSTOMY*L*; RIGHT NEPHROSTOGRAM; PERC NEPHROSTOMY*R*; IR ULTRASOUND GUIDANCE; LEFT NEPHROSTOGRAM  Date: 04/28/2014  PROCEDURE: 1. Ultrasound-guided puncture of the left renal collecting system 2. Left antegrade percutaneous nephrostogram 3. Placement of a 10 French percutaneous nephrostomy tube under fluoroscopic guidance 4. Ultrasound guided puncture of the right renal collecting system 5.  Right antegrade percutaneous nephrostogram 6. Placement of a 10 French percutaneous nephrostomy tube under fluoroscopic guidance Interventional Radiologist:  Criselda Peaches, MD  ANESTHESIA/SEDATION: Moderate (conscious) sedation was used. 2.5 mg Versed, 100 mcg Fentanyl were administered intravenously. The patient's vital signs were monitored continuously by radiology nursing throughout the procedure.  Sedation Time: 30 minutes  2g Ancef were administered intravenously within 1 hr of skin incision.   FLUOROSCOPY TIME:  4 minutes 12 seconds  CONTRAST:  16mL OMNIPAQUE IOHEXOL 300 MG/ML  SOLN  TECHNIQUE: Informed consent was obtained from the patient following explanation of the procedure, risks, benefits and alternatives. The patient understands, agrees and consents for the procedure. All questions were addressed. A time out was performed.  Maximal barrier sterile technique utilized including caps, mask, sterile gowns, sterile gloves, large sterile drape, hand hygiene, and Betadine skin prep.  Attention was first turned to the left flank. The left flank was interrogated with ultrasound. The kidney is moderately hydronephrotic. There is marked perinephric fluid and stranding consistent with the findings on the recent CT scan. A suitable access site on the skin overlying the lower pole, posterior calix was identified. After local anesthesia was achieved, a small skin nick was made with an 11 blade scalpel. A 21 gauge Accustick needle was then advanced under direct sonographic guidance into the lower pole of the left kidney. A 0.018 inch wire was advanced under fluoroscopic guidance into the left renal collecting system. The Accustick sheath was then advanced over the wire and a 0.018 system exchanged for a 0.035 system. Gentle hand injection of contrast material confirms placement of the sheath within the renal collecting system. An antegrade nephrostogram was performed confirming moderate hydroureteronephrosis. There is complete obstruction of the distal ureter at the level of the mid sacral ala. The tract from the scan into the renal collecting system was then dilated serially to 10-French. A 10-French Cook all-purpose drain was then placed and positioned under fluoroscopic guidance. The locking loop is well formed within the left renal pelvis. The catheter was secured to the skin with 2-0 Prolene and a sterile bandage was placed. Catheter was left to gravity bag drainage.  Attention was next turned to the right  flank. The right flank was interrogated with ultrasound. The right kidney is markedly hydronephrotic. No significant perinephric fluid. A suitable access site on the skin overlying the lower pole, posterior calix was identified. After local anesthesia was achieved, a small skin nick was made with an 11 blade scalpel. A 21 gauge Accustick needle was then advanced under direct sonographic guidance into the lower pole of the right kidney. A 0.018 inch wire was advanced under fluoroscopic guidance into the left renal collecting system. The Accustick sheath was then advanced over the wire and a 0.018 system exchanged for a 0.035 system. Gentle hand injection of contrast material confirms placement of the sheath within the renal collecting system. An antegrade nephrostogram was performed confirming severe hydroureteronephrosis. There is complete obstruction of the distal ureter at the level of the mid sacral ala. The tract from the scan into the renal collecting system was then dilated serially to 10-French. A 10-French Cook all-purpose drain was then placed and positioned under fluoroscopic guidance. The locking loop is well formed within the left renal pelvis. The catheter was secured to the skin with 2-0 Prolene and a sterile bandage was placed. Catheter was left to gravity bag drainage.  COMPLICATIONS: None immediate.  IMPRESSION: 1. Moderate left hydronephrosis with evidence of forniceal rupture resulting  in moderate perinephric fluid. 2. Severe right hydroureteronephrosis. 3. Successful placement of bilateral 10 French percutaneous nephrostomy tubes. 4. Bilateral percutaneous nephrostograms demonstrate complete obstruction of both ureters at the level of the mid sacrum consistent with malignant obstructive uropathy.  PLAN: Maintained tubes to gravity drainage and follow creatinine. Hematuria is expected to clear over 24-48 hr. Given presumed local recurrence of colon carcinoma in the setting of active lung  adenocarcinoma, these tubes will likely be present for the remainder of the patient's life. Once they have healed (4-6 weeks), an effort can be made to cross the obstructions for conversion to percutaneous nephroureteral stents, or double-J ureteral stents if internalization is desired.  Signed,  Criselda Peaches, MD  [facility]  [facility]   Electronically Signed   By: Jacqulynn Cadet M.D.   On: 04/28/2014 08:01   Ir US Guide Bx Asp/drain  04/28/2014   CLINICAL DATA:  73 year old male with a past history of colonic adenocarcinoma treated with neoadjuvant chemoradiation followed by resection and colostomy. 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 He was transferred to the emergency department today from Radiation Oncology for progressive left flank pain, decreased urine output, nausea and vomiting. 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 There is evidence of forniceal rupture on the left with marked perinephric fluid. His baseline creatinine is 0.8 has markedly increased 2 7. He is in acute renal failure and requires emergent bilateral percutaneous nephrostomy tube placement for renal decompression and salvage of renal function.  EXAM: PERC NEPHROSTOMY*L*; RIGHT NEPHROSTOGRAM; PERC NEPHROSTOMY*R*; IR ULTRASOUND GUIDANCE; LEFT NEPHROSTOGRAM  Date: 04/28/2014  PROCEDURE: 1. Ultrasound-guided puncture of the left renal collecting system 2. Left antegrade percutaneous nephrostogram 3. Placement of a 10 French percutaneous nephrostomy tube under fluoroscopic guidance 4. Ultrasound guided puncture of the right renal collecting system 5. Right antegrade percutaneous nephrostogram 6. Placement of a 10 French percutaneous nephrostomy tube under fluoroscopic guidance Interventional Radiologist:  Criselda Peaches, MD  ANESTHESIA/SEDATION:  Moderate (conscious) sedation was used. 2.5 mg Versed, 100 mcg Fentanyl were administered intravenously. The patient's vital signs were monitored continuously by radiology nursing throughout the procedure.  Sedation Time: 30 minutes  2g Ancef were administered intravenously within 1 hr of skin incision.  FLUOROSCOPY TIME:  4 minutes 12 seconds  CONTRAST:  83mL OMNIPAQUE IOHEXOL 300 MG/ML  SOLN  TECHNIQUE: Informed consent was obtained from the patient following explanation of the procedure, risks, benefits and alternatives. The patient understands, agrees and consents for the procedure. All questions were addressed. A time out was performed.  Maximal barrier sterile technique utilized including caps, mask, sterile gowns, sterile gloves, large sterile drape, hand hygiene, and Betadine skin prep.  Attention was first turned to the left flank. The left flank was interrogated with ultrasound. The kidney is moderately hydronephrotic. There is marked perinephric fluid and stranding consistent with the findings on the recent CT scan. A suitable access site on the skin overlying the lower pole, posterior calix was identified. After local anesthesia was achieved, a small skin nick was made with an 11 blade scalpel. A 21 gauge Accustick needle was then advanced under direct sonographic guidance into the lower pole of the left kidney. A 0.018 inch wire was advanced under fluoroscopic guidance into the left renal collecting system. The Accustick sheath was then advanced over the wire and a 0.018 system exchanged for a 0.035 system. Gentle hand injection of  contrast material confirms placement of the sheath within the renal collecting system. An antegrade nephrostogram was performed confirming moderate hydroureteronephrosis. There is complete obstruction of the distal ureter at the level of the mid sacral ala. The tract from the scan into the renal collecting system was then dilated serially to 10-French. A 10-French Cook  all-purpose drain was then placed and positioned under fluoroscopic guidance. The locking loop is well formed within the left renal pelvis. The catheter was secured to the skin with 2-0 Prolene and a sterile bandage was placed. Catheter was left to gravity bag drainage.  Attention was next turned to the right flank. The right flank was interrogated with ultrasound. The right kidney is markedly hydronephrotic. No significant perinephric fluid. A suitable access site on the skin overlying the lower pole, posterior calix was identified. After local anesthesia was achieved, a small skin nick was made with an 11 blade scalpel. A 21 gauge Accustick needle was then advanced under direct sonographic guidance into the lower pole of the right kidney. A 0.018 inch wire was advanced under fluoroscopic guidance into the left renal collecting system. The Accustick sheath was then advanced over the wire and a 0.018 system exchanged for a 0.035 system. Gentle hand injection of contrast material confirms placement of the sheath within the renal collecting system. An antegrade nephrostogram was performed confirming severe hydroureteronephrosis. There is complete obstruction of the distal ureter at the level of the mid sacral ala. The tract from the scan into the renal collecting system was then dilated serially to 10-French. A 10-French Cook all-purpose drain was then placed and positioned under fluoroscopic guidance. The locking loop is well formed within the left renal pelvis. The catheter was secured to the skin with 2-0 Prolene and a sterile bandage was placed. Catheter was left to gravity bag drainage.  COMPLICATIONS: None immediate.  IMPRESSION: 1. Moderate left hydronephrosis with evidence of forniceal rupture resulting in moderate perinephric fluid. 2. Severe right hydroureteronephrosis. 3. Successful placement of bilateral 10 French percutaneous nephrostomy tubes. 4. Bilateral percutaneous nephrostograms demonstrate complete  obstruction of both ureters at the level of the mid sacrum consistent with malignant obstructive uropathy.  PLAN: Maintained tubes to gravity drainage and follow creatinine. Hematuria is expected to clear over 24-48 hr. Given presumed local recurrence of colon carcinoma in the setting of active lung adenocarcinoma, these tubes will likely be present for the remainder of the patient's life. Once they have healed (4-6 weeks), an effort can be made to cross the obstructions for conversion to percutaneous nephroureteral stents, or double-J ureteral stents if internalization is desired.  Signed,  Criselda Peaches, MD  [facility]  [facility]   Electronically Signed   By: Jacqulynn Cadet M.D.   On: 04/28/2014 08:01   Ir US Guide Bx Asp/drain  04/28/2014   CLINICAL DATA:  73 year old male with a past history of colonic adenocarcinoma treated with neoadjuvant chemoradiation followed by resection and colostomy. 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 He was transferred to the emergency department today from Radiation Oncology for progressive left flank pain, decreased urine output, nausea and vomiting. 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 There is evidence of forniceal rupture on the left with marked perinephric fluid. His baseline creatinine is 0.8 has markedly increased 2 7. He is in acute renal failure and requires emergent bilateral percutaneous nephrostomy tube placement for renal decompression and salvage  of renal function.  EXAM: PERC NEPHROSTOMY*L*; RIGHT NEPHROSTOGRAM; PERC NEPHROSTOMY*R*; IR ULTRASOUND GUIDANCE; LEFT NEPHROSTOGRAM  Date: 04/28/2014  PROCEDURE: 1. Ultrasound-guided puncture of the left renal collecting system 2. Left antegrade percutaneous nephrostogram 3. Placement of a 10 French percutaneous nephrostomy tube  under fluoroscopic guidance 4. Ultrasound guided puncture of the right renal collecting system 5. Right antegrade percutaneous nephrostogram 6. Placement of a 10 French percutaneous nephrostomy tube under fluoroscopic guidance Interventional Radiologist:  Criselda Peaches, MD  ANESTHESIA/SEDATION: Moderate (conscious) sedation was used. 2.5 mg Versed, 100 mcg Fentanyl were administered intravenously. The patient's vital signs were monitored continuously by radiology nursing throughout the procedure.  Sedation Time: 30 minutes  2g Ancef were administered intravenously within 1 hr of skin incision.  FLUOROSCOPY TIME:  4 minutes 12 seconds  CONTRAST:  75mL OMNIPAQUE IOHEXOL 300 MG/ML  SOLN  TECHNIQUE: Informed consent was obtained from the patient following explanation of the procedure, risks, benefits and alternatives. The patient understands, agrees and consents for the procedure. All questions were addressed. A time out was performed.  Maximal barrier sterile technique utilized including caps, mask, sterile gowns, sterile gloves, large sterile drape, hand hygiene, and Betadine skin prep.  Attention was first turned to the left flank. The left flank was interrogated with ultrasound. The kidney is moderately hydronephrotic. There is marked perinephric fluid and stranding consistent with the findings on the recent CT scan. A suitable access site on the skin overlying the lower pole, posterior calix was identified. After local anesthesia was achieved, a small skin nick was made with an 11 blade scalpel. A 21 gauge Accustick needle was then advanced under direct sonographic guidance into the lower pole of the left kidney. A 0.018 inch wire was advanced under fluoroscopic guidance into the left renal collecting system. The Accustick sheath was then advanced over the wire and a 0.018 system exchanged for a 0.035 system. Gentle hand injection of contrast material confirms placement of the sheath within the renal  collecting system. An antegrade nephrostogram was performed confirming moderate hydroureteronephrosis. There is complete obstruction of the distal ureter at the level of the mid sacral ala. The tract from the scan into the renal collecting system was then dilated serially to 10-French. A 10-French Cook all-purpose drain was then placed and positioned under fluoroscopic guidance. The locking loop is well formed within the left renal pelvis. The catheter was secured to the skin with 2-0 Prolene and a sterile bandage was placed. Catheter was left to gravity bag drainage.  Attention was next turned to the right flank. The right flank was interrogated with ultrasound. The right kidney is markedly hydronephrotic. No significant perinephric fluid. A suitable access site on the skin overlying the lower pole, posterior calix was identified. After local anesthesia was achieved, a small skin nick was made with an 11 blade scalpel. A 21 gauge Accustick needle was then advanced under direct sonographic guidance into the lower pole of the right kidney. A 0.018 inch wire was advanced under fluoroscopic guidance into the left renal collecting system. The Accustick sheath was then advanced over the wire and a 0.018 system exchanged for a 0.035 system. Gentle hand injection of contrast material confirms placement of the sheath within the renal collecting system. An antegrade nephrostogram was performed confirming severe hydroureteronephrosis. There is complete obstruction of the distal ureter at the level of the mid sacral ala. The tract from the scan into the renal collecting system was then dilated serially to 10-French. A 10-French Cook all-purpose drain  was then placed and positioned under fluoroscopic guidance. The locking loop is well formed within the left renal pelvis. The catheter was secured to the skin with 2-0 Prolene and a sterile bandage was placed. Catheter was left to gravity bag drainage.  COMPLICATIONS: None  immediate.  IMPRESSION: 1. Moderate left hydronephrosis with evidence of forniceal rupture resulting in moderate perinephric fluid. 2. Severe right hydroureteronephrosis. 3. Successful placement of bilateral 10 French percutaneous nephrostomy tubes. 4. Bilateral percutaneous nephrostograms demonstrate complete obstruction of both ureters at the level of the mid sacrum consistent with malignant obstructive uropathy.  PLAN: Maintained tubes to gravity drainage and follow creatinine. Hematuria is expected to clear over 24-48 hr. Given presumed local recurrence of colon carcinoma in the setting of active lung adenocarcinoma, these tubes will likely be present for the remainder of the patient's life. Once they have healed (4-6 weeks), an effort can be made to cross the obstructions for conversion to percutaneous nephroureteral stents, or double-J ureteral stents if internalization is desired.  Signed,  Criselda Peaches, MD  [facility]  [facility]   Electronically Signed   By: Jacqulynn Cadet M.D.   On: 04/28/2014 08:01   Dg Abd Acute W/chest  04/13/2014   CLINICAL DATA:  Abdominal pain and nausea.  EXAM: ACUTE ABDOMEN SERIES (ABDOMEN 2 VIEW & CHEST 1 VIEW)  COMPARISON:  CT chest, abdomen and pelvis 03/21/2014. Chest radiograph 12/27/2013.  FINDINGS: The cardiomediastinal silhouette is within normal limits. The lungs remain hyperinflated. Peripheral right upper lobe mass is again seen and better evaluated on recent chest CT. Interstitial markings are mildly prominent bilaterally, similar to the prior study. There is no evidence of new airspace consolidation, overt edema, pleural effusion, or pneumothorax.  There is no evidence of intraperitoneal free air. No significant air-fluid levels are identified. Gas is present in nondilated colon. There is a left sided colostomy. There is a paucity of small bowel gas. Surgical clips are present in the pelvis. No acute osseous  abnormality is identified.  IMPRESSION: 1. Peripheral right upper lobe mass. No evidence of acute airspace disease. 2. Nonspecific bowel gas pattern with paucity of small bowel gas.   Electronically Signed   By: Logan Bores   On: 04/13/2014 12:37    Microbiology: Recent Results (from the past 240 hour(s))  CULTURE, BLOOD (ROUTINE X 2)     Status: None   Collection Time    04/27/14  5:10 PM      Result Value Ref Range Status   Specimen Description BLOOD RIGHT ARM   Final   Special Requests BOTTLES DRAWN AEROBIC AND ANAEROBIC 5CC   Final   Culture  Setup Time     Final   Value: 04/28/2014 04:03     Performed at [facility]   Culture     Final   Value:        BLOOD CULTURE RECEIVED NO GROWTH TO DATE CULTURE WILL BE HELD FOR 5 DAYS BEFORE ISSUING A FINAL NEGATIVE REPORT     Performed at [facility]   Report Status PENDING   Incomplete  URINE CULTURE     Status: None   Collection Time    04/27/14  5:22 PM      Result Value Ref Range Status   Specimen Description URINE, RANDOM   Final   Special Requests NONE   Final   Culture  Setup Time     Final   Value: 04/27/2014 23:38  Performed at [facility]     Final   Value: 3,000 COLONIES/ML     Performed at [facility]     Final   Value: INSIGNIFICANT GROWTH     Performed at [facility]   Report Status 04/28/2014 FINAL   Final  CULTURE, BLOOD (ROUTINE X 2)     Status: None   Collection Time    04/27/14  5:25 PM      Result Value Ref Range Status   Specimen Description BLOOD RIGHT FOREARM   Final   Special Requests BOTTLES DRAWN AEROBIC AND ANAEROBIC 5ML   Final   Culture  Setup Time     Final   Value: 04/27/2014 22:42     Performed at [facility]   Culture     Final   Value:        BLOOD CULTURE RECEIVED NO GROWTH TO DATE CULTURE WILL BE HELD FOR 5 DAYS BEFORE ISSUING A FINAL NEGATIVE REPORT     Performed at [facility]   Report Status  PENDING   Incomplete     Labs: Basic Metabolic Panel:  Recent Labs Lab 04/27/14 1710 04/27/14 1720 04/28/14 0459 04/28/14 2015 04/29/14 0650 04/30/14 0600  NA 135* 138 141 139 139 141  K 3.4* 3.3* 3.0* 3.4* 3.3* 3.2*  CL 96 102 108 105 105 102  CO2 18*  --  19 21 22 28   GLUCOSE 90 86 88 155* 92 98  BUN 45* 42* 35* 20 12 7   CREATININE 6.74* 7.00* 4.57* 1.81* 1.16 0.80  CALCIUM 8.4  --  7.8* 7.6* 7.7* 8.2*  MG  --   --   --  1.2*  --  1.4*   Liver Function Tests: No results found for this basename: AST, ALT, ALKPHOS, BILITOT, PROT, ALBUMIN,  in the last 168 hours No results found for this basename: LIPASE, AMYLASE,  in the last 168 hours No results found for this basename: AMMONIA,  in the last 168 hours CBC:  Recent Labs Lab 04/27/14 1710 04/27/14 1720 04/28/14 0459 04/28/14 2015 04/29/14 0650 04/30/14 0600  WBC 9.6  --  9.8 10.0 10.5 9.4  NEUTROABS 7.8*  --   --   --   --   --   HGB 8.7* 8.2* 7.1* 7.2* 7.6* 7.8*  HCT 26.1* 24.0* 21.0* 21.5* 22.2* 23.3*  MCV 77.9*  --  78.1 79.6 77.9* 78.7  PLT PLATELET CLUMPS NOTED ON SMEAR, COUNT APPEARS INCREASED  --  253 365 429* 440*   Cardiac Enzymes: No results found for this basename: CKTOTAL, CKMB, CKMBINDEX, TROPONINI,  in the last 168 hours BNP: BNP (last 3 results) No results found for this basename: PROBNP,  in the last 8760 hours CBG: No results found for this basename: GLUCAP,  in the last 168 hours   Signed:  Modena Jansky, MD, FACP, [facility]. [facility] Pager [phone]  If 7PM-7AM, please contact night-coverage [email or web address] Password [email or web address] 04/30/2014, 2:01 PM

## 2014-05-01 ENCOUNTER — Other Ambulatory Visit: Payer: Self-pay | Admitting: *Deleted

## 2014-05-01 ENCOUNTER — Telehealth: Payer: Self-pay | Admitting: Radiation Oncology

## 2014-05-01 ENCOUNTER — Telehealth: Payer: Self-pay | Admitting: *Deleted

## 2014-05-01 DIAGNOSIS — C3490 Malignant neoplasm of unspecified part of unspecified bronchus or lung: Secondary | ICD-10-CM

## 2014-05-01 DIAGNOSIS — C3410 Malignant neoplasm of upper lobe, unspecified bronchus or lung: Secondary | ICD-10-CM

## 2014-05-01 DIAGNOSIS — Z483 Aftercare following surgery for neoplasm: Secondary | ICD-10-CM

## 2014-05-01 NOTE — Telephone Encounter (Signed)
Called pt to check on status.  I spoke with wife.  She stated he was not eating well.  I listened as she explained different foods he liked and to support calorie intake.  I stated I would let cancer center dietitian know of poor intake.  I also inquired about her condition.  She stated she has a walker and pain medication.  She stated she has a follow up with her primary.

## 2014-05-01 NOTE — Progress Notes (Signed)
Patient discharge teaching given, including activity, diet, follow-up appoints, and medications. Patient verbalized understanding of all discharge instructions. IV access was d/c'd. Vitals are stable. Skin is intact except as charted in most recent assessments. Pt to be escorted out by NT, to be driven home by family.  Coreen Shippee, MBA, BS, RN 

## 2014-05-01 NOTE — Telephone Encounter (Signed)
Phoned patient as encouraged by Dr. [name] to assess status. Patient reports that he was discharged from the hospital yesterday. He states, "those bags in my kidneys are draining fine." Reports pain continues. States, "I am probably over taking my pain medication but, I can only get relief when I take two oxycodone tablets and two tylenol together." He goes on to explain that often he doesn't wait the recommended four hours between doses instead just takes them when he feels the pain coming on. Patient requesting refill of oxycodone. Oxy IR last filled on 5/15, qty 120 by Dr. Nils Pyle. Patient states, "I only have two tablets left." Questioned if patient had intentions of moving forward with future treatment in the cancer center. Patient reports he is on board for ten more radiation treatments to the lungs and willing to hear what medical oncology has to offer. Explained to the patient this writer would inform Dr. [name] of these findings and phone him back. Patient expressed appreciation for the call.

## 2014-05-02 ENCOUNTER — Telehealth: Payer: Self-pay | Admitting: Radiation Oncology

## 2014-05-02 ENCOUNTER — Other Ambulatory Visit: Payer: Self-pay | Admitting: Radiation Oncology

## 2014-05-02 DIAGNOSIS — C3490 Malignant neoplasm of unspecified part of unspecified bronchus or lung: Secondary | ICD-10-CM

## 2014-05-02 MED ORDER — OXYCODONE HCL 5 MG PO TABS: 5.0000 mg | ORAL_TABLET | ORAL | Status: AC | PRN

## 2014-05-02 NOTE — Telephone Encounter (Signed)
Phoned patient at home. Made him aware that his OXY IR script is ready for pick up in the rad onc nursing station. Instructed patient to bring driver's license to pick up script. Patient verbalized understanding.

## 2014-05-03 LAB — CULTURE, BLOOD (ROUTINE X 2): Culture: NO GROWTH

## 2014-05-04 ENCOUNTER — Ambulatory Visit: Payer: Medicare Other | Admitting: Radiation Oncology

## 2014-05-04 LAB — CULTURE, BLOOD (ROUTINE X 2): CULTURE: NO GROWTH

## 2014-05-07 ENCOUNTER — Ambulatory Visit: Payer: Medicare Other

## 2014-05-07 ENCOUNTER — Telehealth: Payer: Self-pay | Admitting: Radiation Oncology

## 2014-05-07 NOTE — Telephone Encounter (Signed)
Returned patient's call. Patient understands from his discharge summary he is to make a follow up appointment with Dr. Jacqulynn Cadet of interventional radiology for nephrostomy tube evaluation. Patient states, "I have called some six offices but, have yet to find this doctor...can you help?" Explained to the patient Tammy of [phone] would be contacting him to arrange an appointment with Dr. Laurence Ferrari. Patient understands to call this writer back if he doesn't hear from [place] today.

## 2014-05-08 ENCOUNTER — Ambulatory Visit: Payer: Medicare Other

## 2014-05-08 ENCOUNTER — Telehealth: Payer: Self-pay | Admitting: Radiation Oncology

## 2014-05-08 ENCOUNTER — Encounter (HOSPITAL_COMMUNITY): Payer: Self-pay | Admitting: Occupational Therapy

## 2014-05-08 ENCOUNTER — Other Ambulatory Visit (HOSPITAL_COMMUNITY): Payer: Self-pay | Admitting: Interventional Radiology

## 2014-05-08 DIAGNOSIS — C3490 Malignant neoplasm of unspecified part of unspecified bronchus or lung: Secondary | ICD-10-CM

## 2014-05-08 NOTE — Telephone Encounter (Signed)
Received call from Nunzio Cory, patient's wife. She was tearful and concerned about her husband. She explains he fell twice yesterday and isn't eating. She states, "he is a bag of bones." She went on to explain she was fearful to leave him alone to go to her doctor's appointment later this afternoon. Explained that if she was fearful for her husband's well being she should encourage him to allow her to take him to the emergency room or call an ambulance. Spoke with patient who denies need to go to the emergency room today "but may tomorrow." Patient cancelled the start of radiation treatment again today because he "just isn't feeling well." Advised patient to present to ED or call the ambulance. Patient refused.  Received call from [facility] ext. [phone]. She explains she is attempting to reach patient to scheduled follow up appointment to manage nephrostomy tube. 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 She explains the tubes were placed on May 23 and would need to be change eight weeks out from this date. Explained this writer would reach out to the patient  Phone patient at home and provided him with Heather's number. Patient denies getting any messages and reports he will hang up and call Heather immediately.

## 2014-05-08 NOTE — Progress Notes (Signed)
Late entry due to missed g-code.  05/10/2014 1300  OT G-codes **NOT FOR INPATIENT CLASS**  Functional Assessment Tool Used clinical judgement  Functional Limitation Self care  Self Care Current Status [phone]) CI  Self Care Goal Status (X9584) CI  Self Care Discharge Status [phone]) CI  05-10-14 Nestor Lewandowsky, OTR/L Pager: [phone]

## 2014-05-09 ENCOUNTER — Encounter: Payer: Self-pay | Admitting: Radiation Oncology

## 2014-05-09 ENCOUNTER — Ambulatory Visit: Payer: Medicare Other

## 2014-05-09 ENCOUNTER — Telehealth: Payer: Self-pay | Admitting: Radiation Oncology

## 2014-05-09 ENCOUNTER — Ambulatory Visit
Admission: RE | Admit: 2014-05-09 | Discharge: 2014-05-09 | Disposition: A | Discharge status: Home / Self Care | Payer: Medicare Other | Source: Ambulatory Visit | Attending: Radiation Oncology | Admitting: Radiation Oncology

## 2014-05-09 NOTE — Telephone Encounter (Signed)
Phoned patient to inquire if he plan to present for radiation treatment today at 1445. Patient confirmed he plans to. Informed Jennifer, RT on linac 1 of this.

## 2014-05-10 ENCOUNTER — Ambulatory Visit
Admission: RE | Admit: 2014-05-10 | Discharge: 2014-05-10 | Disposition: A | Discharge status: Home / Self Care | Payer: Medicare Other | Source: Ambulatory Visit | Attending: Radiation Oncology | Admitting: Radiation Oncology

## 2014-05-10 ENCOUNTER — Telehealth: Payer: Self-pay

## 2014-05-10 ENCOUNTER — Encounter: Payer: Self-pay | Admitting: *Deleted

## 2014-05-10 DIAGNOSIS — N179 Acute kidney failure, unspecified: Secondary | ICD-10-CM

## 2014-05-10 DIAGNOSIS — R1900 Intra-abdominal and pelvic swelling, mass and lump, unspecified site: Secondary | ICD-10-CM

## 2014-05-10 DIAGNOSIS — Z515 Encounter for palliative care: Secondary | ICD-10-CM

## 2014-05-10 DIAGNOSIS — R531 Weakness: Secondary | ICD-10-CM

## 2014-05-10 DIAGNOSIS — C3410 Malignant neoplasm of upper lobe, unspecified bronchus or lung: Secondary | ICD-10-CM

## 2014-05-10 DIAGNOSIS — E860 Dehydration: Secondary | ICD-10-CM

## 2014-05-10 DIAGNOSIS — R627 Adult failure to thrive: Secondary | ICD-10-CM

## 2014-05-10 NOTE — Progress Notes (Signed)
Received call from Vladimir Crofts, RN for [facility]. She reports the patient has refused [facility] until today. She explains she has evaluated him for the first time today and he is weak and has nausea and vomiting. Again, advised the patient present to ED via ambulance based upon these findings and prior conversations this week. Pamala Hurry goes on to explain that the patient has threaten suicide and plan to do this in his car. Placed an order for stat advance care social work. Contacted Polo Riley, LCSW for [facility]. Contacted Lucreatia, RN for advance home care and explain situation.

## 2014-05-10 NOTE — Telephone Encounter (Signed)
Wife called to inform us that husband would not be coming in for treatment today.I stated that we were aware of the circumstances as Sam RN spoke with [facility] nurse Pamala Hurry.Patient was still home at 1:10 pm.Informed Vivien Rota G.on  linac 1 patient would not be coming in for treatment.

## 2014-05-10 NOTE — Progress Notes (Signed)
CSW received call from Joaquim Lai, RN, regarding patient expressing suicidal ideation to [place].  CSW contacted [facility] [phone]) for more information- RN received ambulatory referral for HHSW to provide appropriate assessment..  There are no [facility] needs at this time.  CSW encouraged Adv HHRN to contact [place] for additional support as needed.  Polo Riley, MSW, LCSW, OSW-C Clinical Social Worker [facility] [phone]

## 2014-05-11 ENCOUNTER — Telehealth: Payer: Self-pay | Admitting: Radiation Oncology

## 2014-05-11 ENCOUNTER — Ambulatory Visit
Admission: RE | Admit: 2014-05-11 | Discharge: 2014-05-11 | Disposition: A | Discharge status: Home / Self Care | Payer: Medicare Other | Source: Ambulatory Visit | Attending: Radiation Oncology | Admitting: Radiation Oncology

## 2014-05-11 ENCOUNTER — Ambulatory Visit: Payer: Medicare Other

## 2014-05-11 NOTE — Telephone Encounter (Signed)
Phoned to assess patient status. Wife reports he is doing no better, not eating and can't walk. Confirmed she has no intentions of bringing him in for evaluation or treatment. Informed Marita [place], RT for linac one of this finding. Wife expressed she is ready for HOSPICE to come in. Contacted Wadie Lessen, NP with this finding. Explained to the wife Wadie Lessen will be in touch and to call with future needs.

## 2014-05-11 NOTE — Telephone Encounter (Signed)
Returned message left by patient's wife requesting pain medication. Explained a prescription couldn't be provided unless the patient was evaluated. Again, strongly encouraged the patient present to the emergency room or allow hospice to assist. Wife wanted this writer to speak with her husband. Husband verbalized desire to remain in his home. Explained hospice could assist with pain management and hydration with his permission. Patient verbalized he would now allow hospice to assist. Immediately phoned [place]. She confirmed she would reach out to the patient again urgently. Phoned Wadie Lessen, NP informing her of these findings.

## 2014-05-12 ENCOUNTER — Other Ambulatory Visit: Payer: Self-pay | Admitting: Radiation Oncology

## 2014-05-12 NOTE — Progress Notes (Signed)
04/14/14 1039  PT Time Calculation  PT Start Time 1004  PT Stop Time 1028  PT Time Calculation (min) 24 min  PT G-Codes **NOT FOR INPATIENT CLASS**  Functional Assessment Tool Used clincal judgement  Functional Limitation Mobility: Walking and moving around  Mobility: Walking and Moving Around Current Status (C5885) CI  Mobility: Walking and Moving Around Goal Status (O2774) [facility]  Mobility: Walking and Moving Around Discharge Status (J2878) [facility]  PT General Charges  $$ ACUTE PT VISIT 1 Procedure  PT Evaluation  $Initial PT Evaluation Tier I 1 Procedure  PT Treatments  $Gait Training 8-22 mins  Kenyon Ana, [place] Pager: [phone] 05/12/2014

## 2014-05-14 ENCOUNTER — Ambulatory Visit: Admission: RE | Admit: 2014-05-14 | Payer: Medicare Other | Source: Ambulatory Visit

## 2014-05-14 ENCOUNTER — Telehealth: Payer: Self-pay | Admitting: Radiation Oncology

## 2014-05-15 ENCOUNTER — Telehealth: Payer: Self-pay | Admitting: Radiation Oncology

## 2014-05-15 ENCOUNTER — Ambulatory Visit: Payer: Medicare Other

## 2014-05-15 NOTE — Telephone Encounter (Signed)
Opened in error

## 2014-05-15 NOTE — Telephone Encounter (Signed)
Phoned patient's home to assess status. Spoke with wife she explains a Merchandiser, retail is there now doing an evaluation. She reports arrangements are being made for a hospital bed, bedside commode and shower chair to be delivered. She states, "they will manage his pain." She states, "he won't be coming in for anymore radiation treatments since hospice has come in." Encouraged her to call with future needs and provide the hospice number with my number for questions. She verbalized understanding and expressed appreciation for the call.

## 2014-05-16 ENCOUNTER — Ambulatory Visit: Payer: Medicare Other

## 2014-05-17 ENCOUNTER — Ambulatory Visit: Admission: RE | Admit: 2014-05-17 | Payer: Medicare Other | Source: Ambulatory Visit

## 2014-05-18 ENCOUNTER — Ambulatory Visit: Payer: Medicare Other

## 2014-05-20 NOTE — Progress Notes (Signed)
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  But, he only received a single fraction due to rapidly declining health and pain.  Beams/energy:   He was treated using 4 dynamic conformal arcs to conform the high dose region around his right upper lung mass only with minimal exposure to uninvolved lung, esophagus, and mediastinal structures.  6 MV X-rays were used for all arcs.  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  Given the complexity of the pelvic mass in light of the patient's advanced age, anorexia, pain, and rapid decline, radiation was discontinued in favor of home hospice care.  Plan: The patient has discontinued radiation treatment. He will follow-up with home hospice. I advised his family to call if they have any questions or concerns related to his radiation. ________________________________  Sheral Apley [name], M.D.

## 2014-05-21 ENCOUNTER — Telehealth: Payer: Self-pay | Admitting: Radiation Oncology

## 2014-05-21 ENCOUNTER — Ambulatory Visit: Payer: Medicare Other

## 2014-05-21 NOTE — Telephone Encounter (Signed)
Phoned to check patient status. No answer. Left message requesting return call.

## 2014-05-21 NOTE — Telephone Encounter (Signed)
Wife returned call. Then, this Probation officer returned her call. Wife reports she has a cracked vertebrae from her fall in the hospital. She explains that [place] continues to care for her husband. She confirms hospice is doing a great job and that they come three times per week to bath her husband. She reports that her husband is "going down hill." She denies additional needs and expresses appreciation for the call.

## 2014-05-22 ENCOUNTER — Ambulatory Visit: Payer: Medicare Other

## 2014-05-23 ENCOUNTER — Ambulatory Visit: Payer: Medicare Other

## 2014-05-24 ENCOUNTER — Ambulatory Visit: Payer: Medicare Other

## 2014-05-25 ENCOUNTER — Ambulatory Visit: Payer: Medicare Other

## 2014-05-28 ENCOUNTER — Encounter: Payer: Self-pay | Admitting: Radiation Oncology

## 2014-05-28 ENCOUNTER — Ambulatory Visit: Payer: Medicare Other

## 2014-05-28 NOTE — Consult Note (Signed)
I have reviewed and discussed the care of this patient in detail with the nurse practitioner including pertinent patient records, physical exam findings and data. I agree with details of this encounter.  

## 2014-05-29 ENCOUNTER — Ambulatory Visit: Payer: Medicare Other

## 2014-06-18 ENCOUNTER — Encounter (HOSPITAL_COMMUNITY): Payer: Self-pay | Admitting: Pharmacy Technician

## 2014-06-22 ENCOUNTER — Inpatient Hospital Stay (HOSPITAL_COMMUNITY): Admission: RE | Admit: 2014-06-22 | Payer: Medicare Other | Source: Ambulatory Visit

## 2014-07-07 DEATH — deceased

## 2015-02-08 ENCOUNTER — Encounter (HOSPITAL_COMMUNITY): Payer: Self-pay

## 2015-06-14 ENCOUNTER — Encounter: Payer: Self-pay | Admitting: Gastroenterology

## 2016-03-14 IMAGING — CT CT ABD-PELV W/ CM
2 of 5 series · 10 of 36 positions shown, 15 images · IV contrast ([ID] [id number])
Comparison: PET-CT 12/21/2013.  Chest CT 12/12/2013.

CLINICAL DATA: Right upper lobe lung cancer. Prior history of
rectal cancer.

EXAM:
CT CHEST, ABDOMEN, AND PELVIS WITH CONTRAST
TECHNIQUE: Multidetector CT imaging of the chest, abdomen and pelvis was
performed following the standard protocol during bolus
administration of intravenous contrast.
CONTRAST:  100mL OMNIPAQUE IOHEXOL 300 MG/ML  SOLN

[Series 601: coronal body · coronal · 1.30mm/px · 1 of 130 slices shown, 2 images]
[im 44/130  soft-tissue]
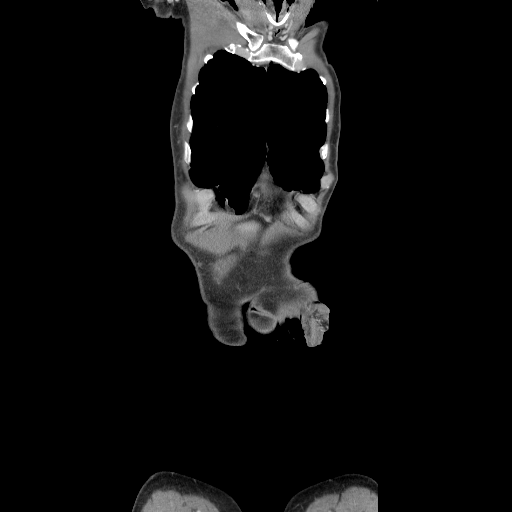
[im 44/130  bone]
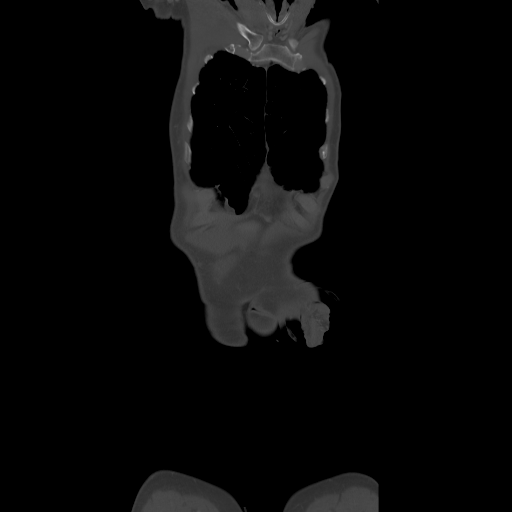

[Series 602: sagittal body · sagittal · 1.30mm/px · 9 of 141 slices shown, 13 images]
[im 13/141  soft-tissue]
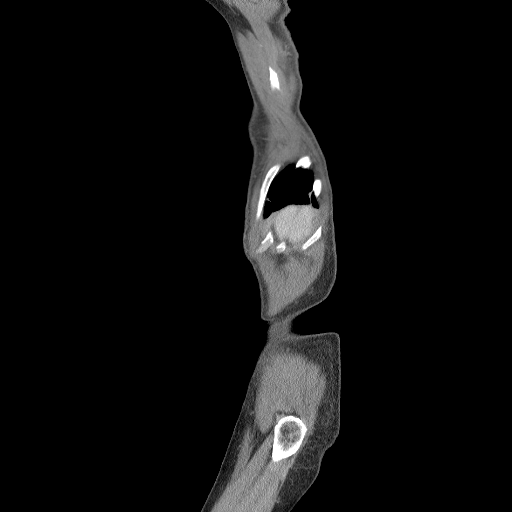
[im 13/141  lung]
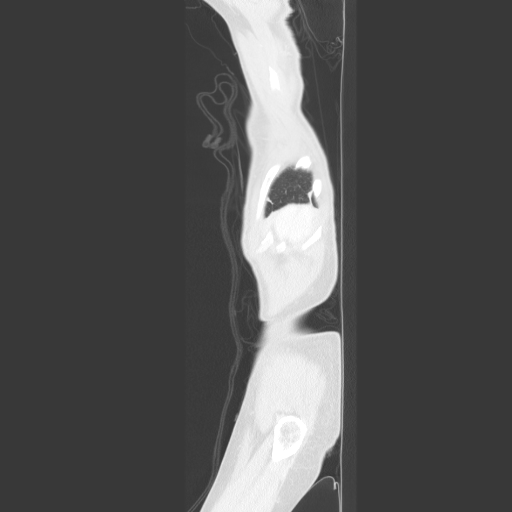
[im 13/141  bone]
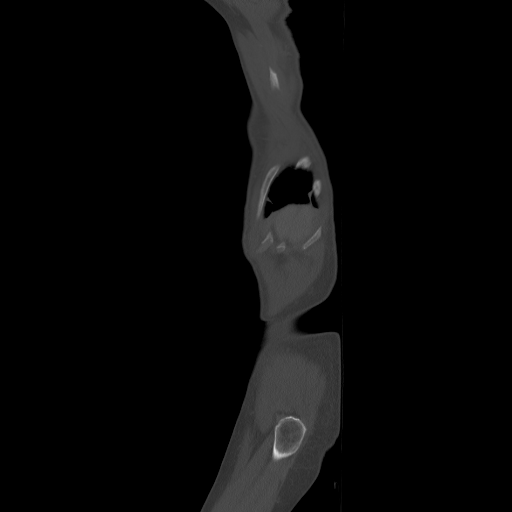
[im 26/141  soft-tissue]
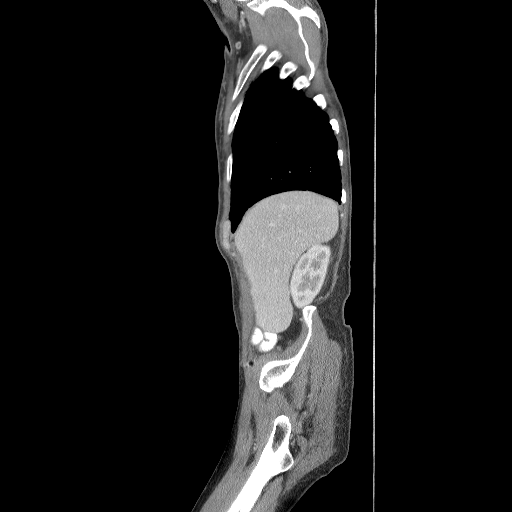
[im 26/141  lung]
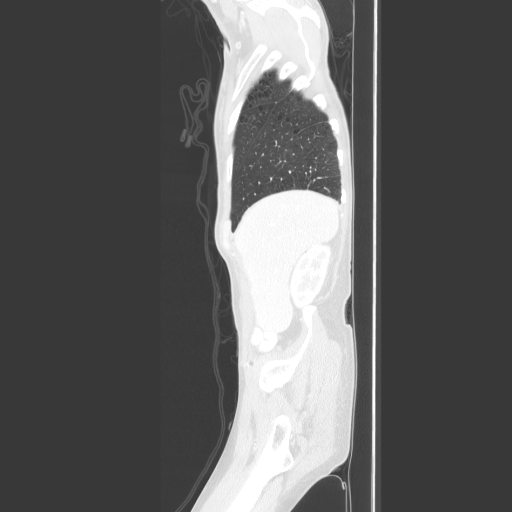
[im 39/141  lung]
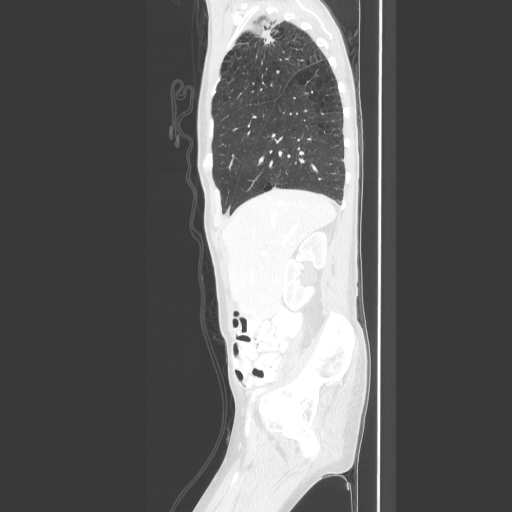
[im 51/141  soft-tissue]
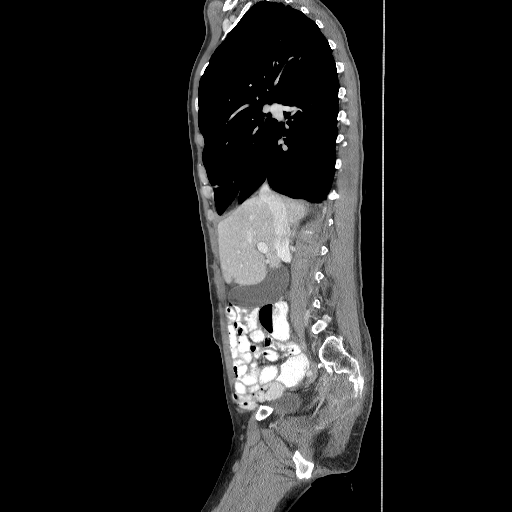
[im 51/141  lung]
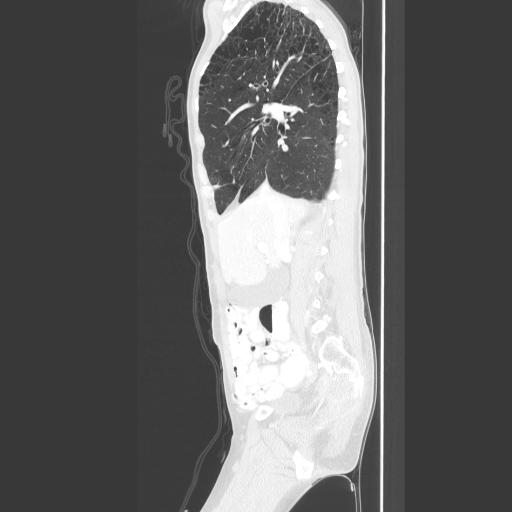
[im 64/141  soft-tissue]
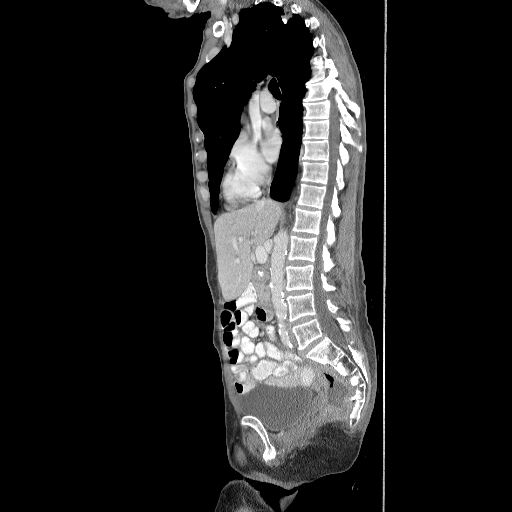
[im 77/141  soft-tissue]
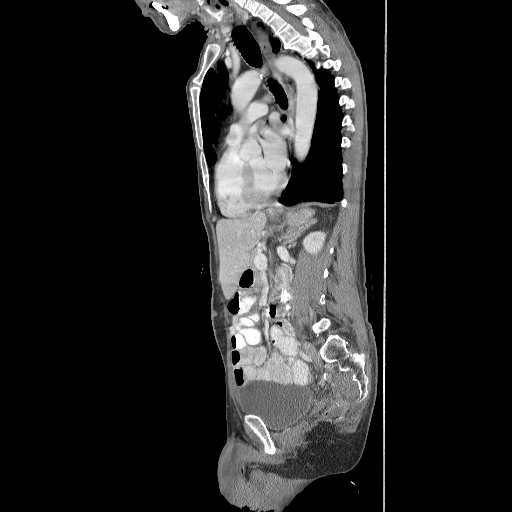
[im 90/141  soft-tissue]
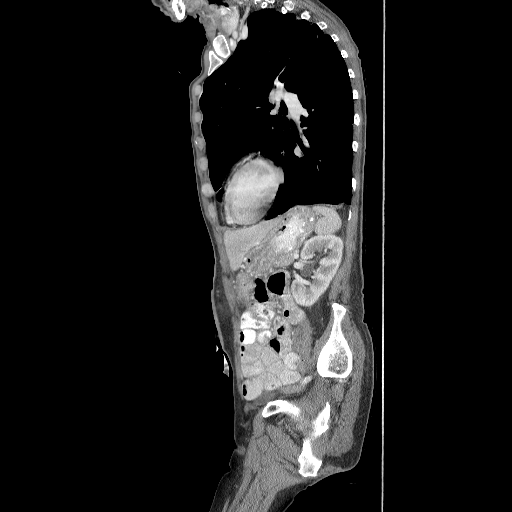
[im 115/141  soft-tissue]
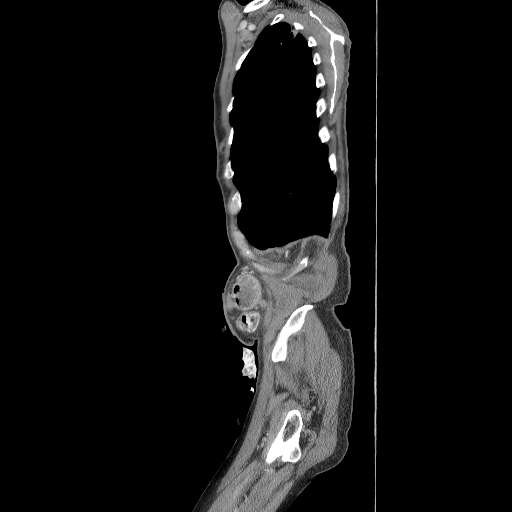
[im 128/141  soft-tissue]
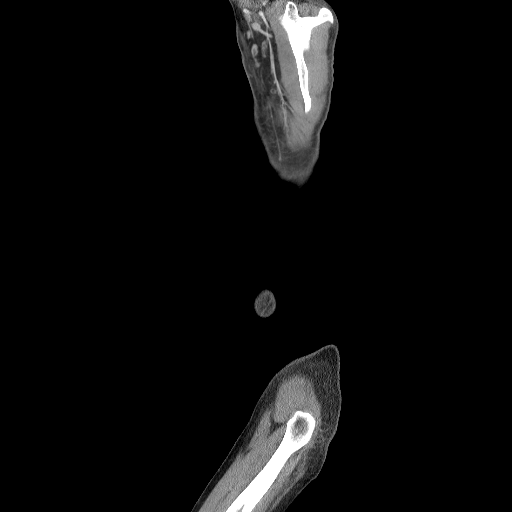

[10 of 36 positions shown; findings below may reference images not displayed]

FINDINGS: CT CHEST FINDINGS

Mediastinum: Heart size is normal. There is no significant
pericardial fluid, thickening or pericardial calcification. No
pathologically enlarged mediastinal or hilar lymph nodes. Mild
atherosclerosis throughout the thoracic aorta. Esophagus is
unremarkable in appearance.

Lungs/Pleura: When compared to prior examinations, the previously
noted pleural-based mass in the periphery of the right upper lobe
has decreased in size, currently measuring 4.8 x 2.3 cm. 10 x 5 mm
pleural based nodule in the periphery of the left upper lobe (image
10 of series 4) corresponding to small focus of hypermetabolism on
recent PET-CT appears unchanged. There is also a partially calcified
pleural based nodular opacity in the medial aspect of the right
upper lobe measuring 1.2 x 1.0 cm (image 6 of series 4), which is
unchanged, and was not hypermetabolic on the recent PET-CT. No other
new suspicious appearing pulmonary nodules or masses are otherwise
noted. No acute consolidative airspace disease. No pleural
effusions. Moderate centrilobular emphysema, most pronounced
throughout the lung apices.

Musculoskeletal: When compared to prior examinations the osteolytic
changes in the lateral aspect of the right third rib are for more
dramatic. No other aggressive appearing lytic or blastic lesions are
noted in the visualized portions of the skeleton.

CT ABDOMEN AND PELVIS FINDINGS

Abdomen/Pelvis: Several sub cm low-attenuation hepatic lesions are
noted on image 59, 60 and 61 in the left lobe of the liver. These
are nonspecific, but similar to prior study 03/05/2011, and
therefore favored to be benign, likely small cysts. The appearance
of the gallbladder, pancreas, spleen, bilateral adrenal glands and
left kidney is unremarkable. There is mild right
hydroureteronephrosis which extends to the level of the pelvis
adjacent to the sacrum, presumably secondary to mass effect or
traction and narrowing of the distal third of the right ureter
related to the presacral and perirectal process (see discussion
below). No significant volume of ascites. No pneumoperitoneum. No
pathologic distention of small bowel. Atherosclerotic calcifications
throughout the abdominal and pelvic vasculature, without evidence of
aneurysm.

Status post low anterior resection. There is a left upper quadrant
colostomy, with a moderate parastomal hernia which contains a
portion of the body/antrum of the stomach. Adjacent to the resection
site in the low anatomic pelvis there is a complex soft tissue, gas
and fluid containing mass in the perirectal and presacral region
which measures approximately 5.4 x 7.2 x 8.3 cm. This appears to
involve the rectal remnant, with extension posteriorly, overall
appearing to represent an inflammatory mass with multiple fistulae.
Although, the possibility of residual malignancy with cavitation of
a mass in this region is difficult to exclude.

Musculoskeletal: Areas of osteolysis in the sacrum adjacent to the
previously described presacral process. Two of these at the level of
S2 and S3 have well-defined margins with a sclerotic borders.
IMPRESSION: 1. Positive response to therapy with slight regression of the
previously noted right upper lobe mass. Greater apparent bony
involvement in the lateral aspect of the right third rib may be
related to increasing invasion of the rib, or could be secondary to
evolving changes following recent radiation therapy.
2. Previously described small pleural-based hypermetabolic nodule in
the periphery of the left upper lobe (image 10 of series 4) is
unchanged. Continued attention on followup studies is recommended.
3. Status post low anterior resection for remote history of rectal
cancer with what appears to be an inflammatory mass in the
perirectal and presacral space is, likely with associated chronic
osteomyelitis. The possibility of residual/recurrent malignancy in
this region is difficult to entirely exclude, but is not strongly
favored; however, correlation with recent biopsy results is
recommended.
4. Left upper quadrant colostomy with moderate-sized parastomal
hernia which contains a short portion of the body/antrum of the
stomach.
5. New mild right hydroureteronephrosis which is presumably related
to either mass effect or traction on the distal third of the right
ureter related to the previously described presacral process.
Urologic consultation may be appropriate if clinically indicated.
6. Additional incidental findings, as above.

## 2017-01-18 ENCOUNTER — Encounter: Payer: Self-pay | Admitting: Gastroenterology
